# Patient Record
Sex: Female | Born: 1985
Health system: Southern US, Community
[De-identification: ages and names within clinical notes are randomized; demographics above are authoritative.]

## PROBLEM LIST (undated history)

## (undated) DIAGNOSIS — M069 Rheumatoid arthritis, unspecified: Secondary | ICD-10-CM

## (undated) DIAGNOSIS — D649 Anemia, unspecified: Secondary | ICD-10-CM

## (undated) DIAGNOSIS — M1990 Unspecified osteoarthritis, unspecified site: Secondary | ICD-10-CM

## (undated) DIAGNOSIS — J45909 Unspecified asthma, uncomplicated: Secondary | ICD-10-CM

## (undated) HISTORY — PX: FOOT SURGERY: SHX648

## (undated) HISTORY — PX: EYE SURGERY: SHX253

## (undated) HISTORY — PX: TUBAL LIGATION: SHX77

## (undated) HISTORY — DX: Rheumatoid arthritis, unspecified: M06.9

## (undated) HISTORY — DX: Unspecified asthma, uncomplicated: J45.909

## (undated) HISTORY — PX: WISDOM TOOTH EXTRACTION: SHX21

---

## 2012-03-22 HISTORY — PX: WISDOM TOOTH EXTRACTION: SHX21

## 2020-06-11 ENCOUNTER — Ambulatory Visit: Admit: 2020-06-11 | Disposition: A | Discharge status: Home / Self Care | Payer: Self-pay | Source: Home / Self Care

## 2020-06-11 ENCOUNTER — Ambulatory Visit
Admission: EM | Admit: 2020-06-11 | Discharge: 2020-06-11 | Disposition: A | Discharge status: Home / Self Care | Payer: Self-pay

## 2020-06-11 ENCOUNTER — Other Ambulatory Visit: Payer: Self-pay

## 2020-06-11 DIAGNOSIS — J069 Acute upper respiratory infection, unspecified: Secondary | ICD-10-CM

## 2020-06-11 DIAGNOSIS — Z20822 Contact with and (suspected) exposure to covid-19: Secondary | ICD-10-CM

## 2020-06-11 HISTORY — DX: Anemia, unspecified: D64.9

## 2020-06-11 MED ORDER — FLUTICASONE PROPIONATE 50 MCG/ACT NA SUSP: 1.0000 | Freq: Every day | NASAL | 0 refills | Status: DC

## 2020-06-11 MED ORDER — BENZONATATE 200 MG PO CAPS: 200.0000 mg | ORAL_CAPSULE | Freq: Three times a day (TID) | ORAL | 0 refills | Status: AC | PRN

## 2020-06-11 NOTE — ED Triage Notes (Signed)
Pt c/o cough, sore throat, headache, fever, and fatigue since Sunday night. Took OTC with slight relief.

## 2020-06-11 NOTE — Discharge Instructions (Signed)
Covid test pending, monitor MyChart for results Rest and fluids Tylenol and ibuprofen as needed May continue Mucinex, add in [facility] May use daily over-the-counter antihistamine such as cetirizine/Zyrtec or loratadine/Claritin Tessalon for cough or over-the-counter Robitussin, Delsym, Dimetapp Follow-up if not improving or worsening

## 2020-06-11 NOTE — ED Provider Notes (Signed)
[facility]    CSN: [id number] Arrival date & time: 06/11/20  0857      History   Chief Complaint Chief Complaint  Patient presents with  . Cough    HPI Erica Fox is a 35 y.o. female presenting today for evaluation of URI symptoms.  Reports sore throat fever ear discomfort fatigue and generalized weakness.  Symptoms began Sunday night persisted for the past 4 days.  Unsure of fevers at home.  Said decreased appetite and generalized fatigue.  Denies any chest pain or shortness of breath.  Using over-the-counter Mucinex.  HPI  Past Medical History:  Diagnosis Date  . Anemia     There are no problems to display for this patient.   History reviewed. No pertinent surgical history.  OB History   No obstetric history on file.      Home Medications    Prior to Admission medications   Medication Sig Start Date End Date Taking? Authorizing Provider  benzonatate (TESSALON) 200 MG capsule Take 1 capsule (200 mg total) by mouth 3 (three) times daily as needed for up to 7 days for cough. 06/11/20 06/18/20 Yes Ion Gonnella C, PA-C  dextromethorphan-guaiFENesin (MUCINEX DM) 30-600 MG 12hr tablet Take 1 tablet by mouth 2 (two) times daily.   Yes [provider]  fluticasone (FLONASE) 50 MCG/ACT nasal spray Place 1-2 sprays into both nostrils daily. 06/11/20  Yes Rodricus Candelaria, Elesa Hacker, PA-C    Family History History reviewed. No pertinent family history.  Social History Social History   Tobacco Use  . Smoking status: Never Smoker  . Smokeless tobacco: Never Used  Substance Use Topics  . Alcohol use: Not Currently  . Drug use: Not Currently     Allergies   Penicillins   Review of Systems Review of Systems  Constitutional: Negative for activity change, appetite change, chills, fatigue and fever.  HENT: Positive for congestion, rhinorrhea, sinus pressure and sore throat. Negative for ear pain and trouble swallowing.   Eyes: Negative for discharge  and redness.  Respiratory: Negative for cough, chest tightness and shortness of breath.   Cardiovascular: Negative for chest pain.  Gastrointestinal: Negative for abdominal pain, diarrhea, nausea and vomiting.  Musculoskeletal: Negative for myalgias.  Skin: Negative for rash.  Neurological: Negative for dizziness, light-headedness and headaches.     Physical Exam Triage Vital Signs ED Triage Vitals  Enc Vitals Group     BP      Pulse      Resp      Temp      Temp src      SpO2      Weight      Height      Head Circumference      Peak Flow      Pain Score      Pain Loc      Pain Edu?      Excl. in [place]?    No data found.  Updated Vital Signs BP 123/61 (BP Location: Left Arm)   Pulse 97   Temp 99.3 F (37.4 C) (Oral)   Resp 18   SpO2 97%   Visual Acuity Right Eye Distance:   Left Eye Distance:   Bilateral Distance:    Right Eye Near:   Left Eye Near:    Bilateral Near:     Physical Exam Vitals and nursing note reviewed.  Constitutional:      Appearance: She is well-developed.     Comments: No acute distress  HENT:     Head: Normocephalic and atraumatic.     Ears:     Comments: Bilateral ears without tenderness to palpation of external auricle, tragus and mastoid, EAC's without erythema or swelling, TM's with good bony landmarks and cone of light. Non erythematous.     Nose: Nose normal.     Mouth/Throat:     Comments: Oral mucosa pink and moist, no tonsillar enlargement or exudate. Posterior pharynx patent and nonerythematous, no uvula deviation or swelling. Normal phonation. Eyes:     Conjunctiva/sclera: Conjunctivae normal.  Cardiovascular:     Rate and Rhythm: Normal rate.  Pulmonary:     Effort: Pulmonary effort is normal. No respiratory distress.     Comments: Breathing comfortably at rest, CTABL, no wheezing, rales or other adventitious sounds auscultated Abdominal:     General: There is no distension.  Musculoskeletal:        General: Normal  range of motion.     Cervical back: Neck supple.  Skin:    General: Skin is warm and dry.  Neurological:     Mental Status: She is alert and oriented to person, place, and time.      UC Treatments / Results  Labs (all labs ordered are listed, but only abnormal results are displayed) Labs Reviewed  COVID-19, FLU A+B NAA    EKG   Radiology No results found.  Procedures Procedures (including critical care time)  Medications Ordered in UC Medications - No data to display  Initial Impression / Assessment and Plan / UC Course  I have reviewed the triage vital signs and the nursing notes.  Pertinent labs & imaging results that were available during my care of the patient were reviewed by me and considered in my medical decision making (see chart for details).     Viral URI with cough-Covid test pending, symptoms x4 days, exam reassuring, recommend symptomatic and supportive care with close monitoring.  Discussed strict return precautions. Patient verbalized understanding and is agreeable with plan.  Final Clinical Impressions(s) / UC Diagnoses   Final diagnoses:  Encounter for screening laboratory testing for COVID-19 virus  Viral URI with cough     Discharge Instructions     Covid test pending, monitor MyChart for results Rest and fluids Tylenol and ibuprofen as needed May continue Mucinex, add in [facility] May use daily over-the-counter antihistamine such as cetirizine/Zyrtec or loratadine/Claritin Tessalon for cough or over-the-counter Robitussin, Delsym, Dimetapp Follow-up if not improving or worsening    ED Prescriptions    Medication Sig Dispense Auth. Provider   benzonatate (TESSALON) 200 MG capsule Take 1 capsule (200 mg total) by mouth 3 (three) times daily as needed for up to 7 days for cough. 28 capsule Kayliana Codd C, PA-C   fluticasone (FLONASE) 50 MCG/ACT nasal spray Place 1-2 sprays into both nostrils daily. 16 g Jareli Highland, [place] C, PA-C      PDMP not reviewed this encounter.   Janith Lima, [place] 06/11/20 [phone]

## 2020-06-12 LAB — COVID-19, FLU A+B NAA
Influenza A, NAA: NOT DETECTED
Influenza B, NAA: NOT DETECTED
SARS-CoV-2, NAA: NOT DETECTED

## 2020-07-16 ENCOUNTER — Encounter: Payer: Self-pay | Admitting: Podiatry

## 2020-07-16 ENCOUNTER — Ambulatory Visit (INDEPENDENT_AMBULATORY_CARE_PROVIDER_SITE_OTHER): Payer: Commercial Managed Care - PPO

## 2020-07-16 ENCOUNTER — Ambulatory Visit: Payer: Commercial Managed Care - PPO | Admitting: Podiatry

## 2020-07-16 ENCOUNTER — Encounter: Payer: Self-pay | Admitting: *Deleted

## 2020-07-16 ENCOUNTER — Other Ambulatory Visit: Payer: Self-pay

## 2020-07-16 DIAGNOSIS — M722 Plantar fascial fibromatosis: Secondary | ICD-10-CM

## 2020-07-16 DIAGNOSIS — M2042 Other hammer toe(s) (acquired), left foot: Secondary | ICD-10-CM | POA: Diagnosis not present

## 2020-07-16 DIAGNOSIS — M7662 Achilles tendinitis, left leg: Secondary | ICD-10-CM

## 2020-07-16 MED ORDER — MELOXICAM 15 MG PO TABS: 15.0000 mg | ORAL_TABLET | Freq: Every day | ORAL | 3 refills | Status: DC

## 2020-07-16 NOTE — Progress Notes (Signed)
  Subjective:  Patient ID: Erica Fox, female    DOB: 27-Sep-1985,  MRN: [id number]  Chief Complaint  Patient presents with  . Foot Pain    Patient presents today for left lateral side foot and achilles pain x 6 months.  She says its sharp pains that comes and goes and is worse by the end of the day.  She also c/o hammertoes left foot that can become painful and bothers her with certain shoes she wears    35 y.o. female presents with the above complaint. History confirmed with patient.  She works on her feet as a Secondary school teacher  Objective:  Physical Exam: warm, good capillary refill, no trophic changes or ulcerative lesions, normal DP and PT pulses and normal sensory exam. Left Foot: She has pain on palpation to the mid substance and insertion of the hallux valgus, hammertoes 2 through 5, are rigid in nature Right Foot: Hammertoes 2 through 5, rigid in nature  Radiographs: X-ray of the left foot: Small calcification at the insertion of the Achilles tendon posterior calcaneus, and hammertoe deformities 2 through 5 Assessment:   1. Hammertoe of left foot   2. Achilles tendinitis of left lower extremity   3. Plantar fasciitis of left foot      Plan:  Patient was evaluated and treated and all questions answered.  Discussed the etiology and treatment options for Achilles tendinitis including stretching, formal physical therapy with an eccentric exercises therapy plan, supportive shoegears such as a running shoe or sneaker, heel lifts, topical and oral medications.  We also discussed that I do not routinely perform injections in this area because of the risk of an increased damage or rupture of the tendon.  We also discussed the role of surgical treatment of this for patients who do not improve after exhausting non-surgical treatment options.  -XR reviewed with patient -Educated on stretching and icing of the affected limb. -Rx for Meloxicam. Advised on risks, benefits, and alternatives  of the medication  -Recommend WBAT in CAM boot this was dispensed -Return in 6 weeks to follow-up on this -If not improved by next visit may consider adding Topaz procedure to surgical intervention  Discussed the etiology and treatment including surgical and non surgical treatment for painful  hammertoes.  She has exhausted all non surgical treatment prior to this visit including shoe gear changes and padding.  She desires surgical intervention. We discussed all risks including but not limited to: pain, swelling, infection, scar, numbness which may be temporary or permanent, chronic pain, stiffness, nerve pain or damage, wound healing problems, bone healing problems including delayed or non-union and recurrence. Specifically we discussed the following procedures: Left foot hammertoe correction 2 through 5 left foot. Informed consent was signed today. Surgery will be scheduled at a mutually agreeable date. Information regarding this will be forwarded to our surgery scheduler.   Surgical plan:  Procedure: -Left foot hammertoe correction 2 through 5  Location: -[place]  Anesthesia plan: -IV sedation with local anesthesia  Postoperative pain plan: - Tylenol 1000 mg every 6 hours, ibuprofen 600 mg every 6 hours, gabapentin 300 mg every 8 hours x5 days, oxycodone 5 mg 1-2 tabs every 6 hours only as needed  DVT prophylaxis: -None required  WB Restrictions / DME needs: -WBAT in short CAM boot, dispensed today    Return in about 6 weeks (around 08/27/2020) for re-check Achilles tendon, recheck plantar fasciitis.

## 2020-07-16 NOTE — Patient Instructions (Signed)
Achilles Tendinitis  with Rehab Achilles tendinitis is a disorder of the Achilles tendon. The Achilles tendon connects the large calf muscles (Gastrocnemius and Soleus) to the heel bone (calcaneus). This tendon is sometimes called the heel cord. It is important for pushing-off and standing on your toes and is important for walking, running, or jumping. Tendinitis is often caused by overuse and repetitive microtrauma. SYMPTOMS  Pain, tenderness, swelling, warmth, and redness may occur over the Achilles tendon even at rest.  Pain with pushing off, or flexing or extending the ankle.  Pain that is worsened after or during activity. CAUSES   Overuse sometimes seen with rapid increase in exercise programs or in sports requiring running and jumping.  Poor physical conditioning (strength and flexibility or endurance).  Running sports, especially training running down hills.  Inadequate warm-up before practice or play or failure to stretch before participation.  Injury to the tendon. PREVENTION   Warm up and stretch before practice or competition.  Allow time for adequate rest and recovery between practices and competition.  Keep up conditioning.  Keep up ankle and leg flexibility.  Improve or keep muscle strength and endurance.  Improve cardiovascular fitness.  Use proper technique.  Use proper equipment (shoes, skates).  To help prevent recurrence, taping, protective strapping, or an adhesive bandage may be recommended for several weeks after healing is complete. PROGNOSIS   Recovery may take weeks to several months to heal.  Longer recovery is expected if symptoms have been prolonged.  Recovery is usually quicker if the inflammation is due to a direct blow as compared with overuse or sudden strain. RELATED COMPLICATIONS   Healing time will be prolonged if the condition is not correctly treated. The injury must be given plenty of time to heal.  Symptoms can reoccur if  activity is resumed too soon.  Untreated, tendinitis may increase the risk of tendon rupture requiring additional time for recovery and possibly surgery. TREATMENT   The first treatment consists of rest anti-inflammatory medication, and ice to relieve the pain.  Stretching and strengthening exercises after resolution of pain will likely help reduce the risk of recurrence. Referral to a physical therapist or athletic trainer for further evaluation and treatment may be helpful.  A walking boot or cast may be recommended to rest the Achilles tendon. This can help break the cycle of inflammation and microtrauma.  Arch supports (orthotics) may be prescribed or recommended by your caregiver as an adjunct to therapy and rest.  Surgery to remove the inflamed tendon lining or degenerated tendon tissue is rarely necessary and has shown less than predictable results. MEDICATION   Nonsteroidal anti-inflammatory medications, such as aspirin and ibuprofen, may be used for pain and inflammation relief. Do not take within 7 days before surgery. Take these as directed by your caregiver. Contact your caregiver immediately if any bleeding, stomach upset, or signs of allergic reaction occur. Other minor pain relievers, such as acetaminophen, may also be used.  Pain relievers may be prescribed as necessary by your caregiver. Do not take prescription pain medication for longer than 4 to 7 days. Use only as directed and only as much as you need.  Cortisone injections are rarely indicated. Cortisone injections may weaken tendons and predispose to rupture. It is better to give the condition more time to heal than to use them. HEAT AND COLD  Cold is used to relieve pain and reduce inflammation for acute and chronic Achilles tendinitis. Cold should be applied for 10 to 15 minutes   every 2 to 3 hours for inflammation and pain and immediately after any activity that aggravates your symptoms. Use ice packs or an ice  massage.  Heat may be used before performing stretching and strengthening activities prescribed by your caregiver. Use a heat pack or a warm soak. SEEK MEDICAL CARE IF:  Symptoms get worse or do not improve in 2 weeks despite treatment.  New, unexplained symptoms develop. Drugs used in treatment may produce side effects.  EXERCISES:  RANGE OF MOTION (ROM) AND STRETCHING EXERCISES - Achilles Tendinitis  These exercises may help you when beginning to rehabilitate your injury. Your symptoms may resolve with or without further involvement from your physician, physical therapist or athletic trainer. While completing these exercises, remember:   Restoring tissue flexibility helps normal motion to return to the joints. This allows healthier, less painful movement and activity.  An effective stretch should be held for at least 30 seconds.  A stretch should never be painful. You should only feel a gentle lengthening or release in the stretched tissue.  STRETCH  Gastroc, Standing   Place hands on wall.  Extend right / left leg, keeping the front knee somewhat bent.  Slightly point your toes inward on your back foot.  Keeping your right / left heel on the floor and your knee straight, shift your weight toward the wall, not allowing your back to arch.  You should feel a gentle stretch in the right / left calf. Hold this position for 10 seconds. Repeat 3 times. Complete this stretch 2 times per day.  STRETCH  Soleus, Standing   Place hands on wall.  Extend right / left leg, keeping the other knee somewhat bent.  Slightly point your toes inward on your back foot.  Keep your right / left heel on the floor, bend your back knee, and slightly shift your weight over the back leg so that you feel a gentle stretch deep in your back calf.  Hold this position for 10 seconds. Repeat 3 times. Complete this stretch 2 times per day.  STRETCH  Gastrocsoleus, Standing  Note: This exercise can place  a lot of stress on your foot and ankle. Please complete this exercise only if specifically instructed by your caregiver.   Place the ball of your right / left foot on a step, keeping your other foot firmly on the same step.  Hold on to the wall or a rail for balance.  Slowly lift your other foot, allowing your body weight to press your heel down over the edge of the step.  You should feel a stretch in your right / left calf.  Hold this position for 10 seconds.  Repeat this exercise with a slight bend in your knee. Repeat 3 times. Complete this stretch 2 times per day.   STRENGTHENING EXERCISES - Achilles Tendinitis These exercises may help you when beginning to rehabilitate your injury. They may resolve your symptoms with or without further involvement from your physician, physical therapist or athletic trainer. While completing these exercises, remember:   Muscles can gain both the endurance and the strength needed for everyday activities through controlled exercises.  Complete these exercises as instructed by your physician, physical therapist or athletic trainer. Progress the resistance and repetitions only as guided.  You may experience muscle soreness or fatigue, but the pain or discomfort you are trying to eliminate should never worsen during these exercises. If this pain does worsen, stop and make certain you are following the directions exactly. If   the pain is still present after adjustments, discontinue the exercise until you can discuss the trouble with your clinician.  STRENGTH - Plantar-flexors   Sit with your right / left leg extended. Holding onto both ends of a rubber exercise band/tubing, loop it around the ball of your foot. Keep a slight tension in the band.  Slowly push your toes away from you, pointing them downward.  Hold this position for 10 seconds. Return slowly, controlling the tension in the band/tubing. Repeat 3 times. Complete this exercise 2 times per day.     STRENGTH - Plantar-flexors   Stand with your feet shoulder width apart. Steady yourself with a wall or table using as little support as needed.  Keeping your weight evenly spread over the width of your feet, rise up on your toes.*  Hold this position for 10 seconds. Repeat 3 times. Complete this exercise 2 times per day.  *If this is too easy, shift your weight toward your right / left leg until you feel challenged. Ultimately, you may be asked to do this exercise with your right / left foot only.  STRENGTH  Plantar-flexors, Eccentric  Note: This exercise can place a lot of stress on your foot and ankle. Please complete this exercise only if specifically instructed by your caregiver.   Place the balls of your feet on a step. With your hands, use only enough support from a wall or rail to keep your balance.  Keep your knees straight and rise up on your toes.  Slowly shift your weight entirely to your right / left toes and pick up your opposite foot. Gently and with controlled movement, lower your weight through your right / left foot so that your heel drops below the level of the step. You will feel a slight stretch in the back of your calf at the end position.  Use the healthy leg to help rise up onto the balls of both feet, then lower weight only on the right / left leg again. Build up to 15 repetitions. Then progress to 3 consecutive sets of 15 repetitions.*  After completing the above exercise, complete the same exercise with a slight knee bend (about 30 degrees). Again, build up to 15 repetitions. Then progress to 3 consecutive sets of 15 repetitions.* Perform this exercise 2 times per day.  *When you easily complete 3 sets of 15, your physician, physical therapist or athletic trainer may advise you to add resistance by wearing a backpack filled with additional weight.  STRENGTH - Plantar Flexors, Seated   Sit on a chair that allows your feet to rest flat on the ground. If  necessary, sit at the edge of the chair.  Keeping your toes firmly on the ground, lift your right / left heel as far as you can without increasing any discomfort in your ankle. Repeat 3 times. Complete this exercise 2 times a day.    Plantar Fasciitis (Heel Spur Syndrome) with Rehab The plantar fascia is a fibrous, ligament-like, soft-tissue structure that spans the bottom of the foot. Plantar fasciitis is a condition that causes pain in the foot due to inflammation of the tissue. SYMPTOMS   Pain and tenderness on the underneath side of the foot.  Pain that worsens with standing or walking. CAUSES  Plantar fasciitis is caused by irritation and injury to the plantar fascia on the underneath side of the foot. Common mechanisms of injury include:  Direct trauma to bottom of the foot.  Damage to a small   nerve that runs under the foot where the main fascia attaches to the heel bone.  Stress placed on the plantar fascia due to bone spurs. RISK INCREASES WITH:   Activities that place stress on the plantar fascia (running, jumping, pivoting, or cutting).  Poor strength and flexibility.  Improperly fitted shoes.  Tight calf muscles.  Flat feet.  Failure to warm-up properly before activity.  Obesity. PREVENTION  Warm up and stretch properly before activity.  Allow for adequate recovery between workouts.  Maintain physical fitness:  Strength, flexibility, and endurance.  Cardiovascular fitness.  Maintain a health body weight.  Avoid stress on the plantar fascia.  Wear properly fitted shoes, including arch supports for individuals who have flat feet.  PROGNOSIS  If treated properly, then the symptoms of plantar fasciitis usually resolve without surgery. However, occasionally surgery is necessary.  RELATED COMPLICATIONS   Recurrent symptoms that may result in a chronic condition.  Problems of the lower back that are caused by compensating for the injury, such as  limping.  Pain or weakness of the foot during push-off following surgery.  Chronic inflammation, scarring, and partial or complete fascia tear, occurring more often from repeated injections.  TREATMENT  Treatment initially involves the use of ice and medication to help reduce pain and inflammation. The use of strengthening and stretching exercises may help reduce pain with activity, especially stretches of the Achilles tendon. These exercises may be performed at home or with a therapist. Your caregiver may recommend that you use heel cups of arch supports to help reduce stress on the plantar fascia. Occasionally, corticosteroid injections are given to reduce inflammation. If symptoms persist for greater than 6 months despite non-surgical (conservative), then surgery may be recommended.   MEDICATION   If pain medication is necessary, then nonsteroidal anti-inflammatory medications, such as aspirin and ibuprofen, or other minor pain relievers, such as acetaminophen, are often recommended.  Do not take pain medication within 7 days before surgery.  Prescription pain relievers may be given if deemed necessary by your caregiver. Use only as directed and only as much as you need.  Corticosteroid injections may be given by your caregiver. These injections should be reserved for the most serious cases, because they may only be given a certain number of times.  HEAT AND COLD  Cold treatment (icing) relieves pain and reduces inflammation. Cold treatment should be applied for 10 to 15 minutes every 2 to 3 hours for inflammation and pain and immediately after any activity that aggravates your symptoms. Use ice packs or massage the area with a piece of ice (ice massage).  Heat treatment may be used prior to performing the stretching and strengthening activities prescribed by your caregiver, physical therapist, or athletic trainer. Use a heat pack or soak the injury in warm water.  SEEK IMMEDIATE MEDICAL  CARE IF:  Treatment seems to offer no benefit, or the condition worsens.  Any medications produce adverse side effects.  EXERCISES- RANGE OF MOTION (ROM) AND STRETCHING EXERCISES - Plantar Fasciitis (Heel Spur Syndrome) These exercises may help you when beginning to rehabilitate your injury. Your symptoms may resolve with or without further involvement from your physician, physical therapist or athletic trainer. While completing these exercises, remember:   Restoring tissue flexibility helps normal motion to return to the joints. This allows healthier, less painful movement and activity.  An effective stretch should be held for at least 30 seconds.  A stretch should never be painful. You should only feel a gentle lengthening   or release in the stretched tissue.  RANGE OF MOTION - Toe Extension, Flexion  Sit with your right / left leg crossed over your opposite knee.  Grasp your toes and gently pull them back toward the top of your foot. You should feel a stretch on the bottom of your toes and/or foot.  Hold this stretch for 10 seconds.  Now, gently pull your toes toward the bottom of your foot. You should feel a stretch on the top of your toes and or foot.  Hold this stretch for 10 seconds. Repeat  times. Complete this stretch 3 times per day.   RANGE OF MOTION - Ankle Dorsiflexion, Active Assisted  Remove shoes and sit on a chair that is preferably not on a carpeted surface.  Place right / left foot under knee. Extend your opposite leg for support.  Keeping your heel down, slide your right / left foot back toward the chair until you feel a stretch at your ankle or calf. If you do not feel a stretch, slide your bottom forward to the edge of the chair, while still keeping your heel down.  Hold this stretch for 10 seconds. Repeat 3 times. Complete this stretch 2 times per day.   STRETCH  Gastroc, Standing  Place hands on wall.  Extend right / left leg, keeping the front knee  somewhat bent.  Slightly point your toes inward on your back foot.  Keeping your right / left heel on the floor and your knee straight, shift your weight toward the wall, not allowing your back to arch.  You should feel a gentle stretch in the right / left calf. Hold this position for 10 seconds. Repeat 3 times. Complete this stretch 2 times per day.  STRETCH  Soleus, Standing  Place hands on wall.  Extend right / left leg, keeping the other knee somewhat bent.  Slightly point your toes inward on your back foot.  Keep your right / left heel on the floor, bend your back knee, and slightly shift your weight over the back leg so that you feel a gentle stretch deep in your back calf.  Hold this position for 10 seconds. Repeat 3 times. Complete this stretch 2 times per day.  STRETCH  Gastrocsoleus, Standing  Note: This exercise can place a lot of stress on your foot and ankle. Please complete this exercise only if specifically instructed by your caregiver.   Place the ball of your right / left foot on a step, keeping your other foot firmly on the same step.  Hold on to the wall or a rail for balance.  Slowly lift your other foot, allowing your body weight to press your heel down over the edge of the step.  You should feel a stretch in your right / left calf.  Hold this position for 10 seconds.  Repeat this exercise with a slight bend in your right / left knee. Repeat 3 times. Complete this stretch 2 times per day.   STRENGTHENING EXERCISES - Plantar Fasciitis (Heel Spur Syndrome)  These exercises may help you when beginning to rehabilitate your injury. They may resolve your symptoms with or without further involvement from your physician, physical therapist or athletic trainer. While completing these exercises, remember:   Muscles can gain both the endurance and the strength needed for everyday activities through controlled exercises.  Complete these exercises as instructed by  your physician, physical therapist or athletic trainer. Progress the resistance and repetitions only as guided.  STRENGTH -   Towel Curls  Sit in a chair positioned on a non-carpeted surface.  Place your foot on a towel, keeping your heel on the floor.  Pull the towel toward your heel by only curling your toes. Keep your heel on the floor. Repeat 3 times. Complete this exercise 2 times per day.  STRENGTH - Ankle Inversion  Secure one end of a rubber exercise band/tubing to a fixed object (table, pole). Loop the other end around your foot just before your toes.  Place your fists between your knees. This will focus your strengthening at your ankle.  Slowly, pull your big toe up and in, making sure the band/tubing is positioned to resist the entire motion.  Hold this position for 10 seconds.  Have your muscles resist the band/tubing as it slowly pulls your foot back to the starting position. Repeat 3 times. Complete this exercises 2 times per day.  Document Released: 03/08/2005 Document Revised: 05/31/2011 Document Reviewed: 06/20/2008 [employer] Patient Information 2014 [employer].  

## 2020-08-13 ENCOUNTER — Telehealth: Payer: Self-pay | Admitting: Urology

## 2020-08-13 NOTE — Telephone Encounter (Signed)
DOS - 09/05/20   HAMMERTOE REPAIR 2-5 LEFT --- 25525    [facility] EFFECTIVE DATE - 03/22/20   PLAN DEDUCTIBLE -  $1,250.00 W/ $1,044.43 to go OUT OF POCKET - $6,000.00 W/ $8,948.59 to go  COINSURANCE - 20% COPAY - $0.00   SPOKE WITH WIL WITH [facility] AND HE STATED THAT FOR CPT CODE 34758 X'S 4 NO PRIOR AUTH IS REQUIRED.   REF # [id number]

## 2020-09-01 ENCOUNTER — Ambulatory Visit: Payer: Commercial Managed Care - PPO | Admitting: Podiatry

## 2020-09-01 ENCOUNTER — Other Ambulatory Visit: Payer: Self-pay

## 2020-09-01 ENCOUNTER — Encounter: Payer: Self-pay | Admitting: Podiatry

## 2020-09-01 DIAGNOSIS — Q6689 Other  specified congenital deformities of feet: Secondary | ICD-10-CM

## 2020-09-01 DIAGNOSIS — M7662 Achilles tendinitis, left leg: Secondary | ICD-10-CM

## 2020-09-01 DIAGNOSIS — M2042 Other hammer toe(s) (acquired), left foot: Secondary | ICD-10-CM | POA: Diagnosis not present

## 2020-09-01 NOTE — Progress Notes (Signed)
  Subjective:  Patient ID: Erica Fox, female    DOB: 1986/01/02,  MRN: [id number]  Chief Complaint  Patient presents with   Plantar Fasciitis    6 week follow up   [place] / PF    35 y.o. female returns with the above complaint. History confirmed with patient.  Overall she is much better after being in the boot.  Some of the pain is now the top of the foot. Objective:  Physical Exam: warm, good capillary refill, no trophic changes or ulcerative lesions, normal DP and PT pulses and normal sensory exam. Left Foot: She has pain on palpation to the mid substance and insertion of the hallux valgus, hammertoes 2 through 5, are rigid in nature, she has pain and limited range of motion of the subtalar joint with cracking sensation Right Foot: Hammertoes 2 through 5, rigid in nature  Radiographs: X-ray of the left foot: Small calcification at the insertion of the Achilles tendon posterior calcaneus, and hammertoe deformities 2 through 5, she has dorsal spurring of the talar head and neck and the calcaneocuboid joint, unable to clearly visualize CN and TC joints on oblique and lateral Assessment:   1. Hammertoe of left foot   2. Achilles tendinitis of left lower extremity   3. Tarsal coalition of left foot      Plan:  Patient was evaluated and treated and all questions answered.  Overall doing much better from an Achilles standpoint.  I think the remainder of this will go away after rehabbing from her hammertoe surgery.  She is having of improvement I do not think we need to do a Topaz and PRP injection at the same time on surgery.   New pain she had with stiffness of the hindfoot and across the dorsal foot is concerning for tarsal coalition that is now getting exacerbated.  Her last radiographs showed significant spurring at the talar navicular and calcaneocuboid joint.  MRI is ordered for tarsal coalition.    Hammertoe surgery 2 through 5 the left foot is scheduled for this  Friday, no further questions today, informed consent previously signed   Surgical plan:  Procedure: -Left foot hammertoe correction 2 through 5  Location: -[place]  Anesthesia plan: -IV sedation with local anesthesia  Postoperative pain plan: - Tylenol 1000 mg every 6 hours, ibuprofen 600 mg every 6 hours, gabapentin 300 mg every 8 hours x5 days, oxycodone 5 mg 1-2 tabs every 6 hours only as needed  DVT prophylaxis: -None required  WB Restrictions / DME needs: -WBAT in short CAM boot, which she has   Return for after surgery.

## 2020-09-05 ENCOUNTER — Other Ambulatory Visit: Payer: Self-pay | Admitting: Podiatry

## 2020-09-05 DIAGNOSIS — M2042 Other hammer toe(s) (acquired), left foot: Secondary | ICD-10-CM | POA: Diagnosis not present

## 2020-09-05 MED ORDER — ACETAMINOPHEN 500 MG PO TABS: 1000.0000 mg | ORAL_TABLET | Freq: Four times a day (QID) | ORAL | 0 refills | Status: AC | PRN

## 2020-09-05 MED ORDER — IBUPROFEN 600 MG PO TABS: 600.0000 mg | ORAL_TABLET | Freq: Four times a day (QID) | ORAL | 0 refills | Status: AC | PRN

## 2020-09-05 MED ORDER — OXYCODONE HCL 5 MG PO TABS: 5.0000 mg | ORAL_TABLET | ORAL | 0 refills | Status: AC | PRN

## 2020-09-05 NOTE — Progress Notes (Signed)
09/05/20 [place] Hammertoe 2-5 L foot

## 2020-09-10 ENCOUNTER — Encounter: Payer: Self-pay | Admitting: Podiatry

## 2020-09-10 ENCOUNTER — Other Ambulatory Visit: Payer: Self-pay

## 2020-09-10 ENCOUNTER — Ambulatory Visit (INDEPENDENT_AMBULATORY_CARE_PROVIDER_SITE_OTHER): Payer: Commercial Managed Care - PPO

## 2020-09-10 ENCOUNTER — Ambulatory Visit (INDEPENDENT_AMBULATORY_CARE_PROVIDER_SITE_OTHER): Payer: Commercial Managed Care - PPO | Admitting: Podiatry

## 2020-09-10 VITALS — BP 121/77 | HR 91 | Temp 98.5°F

## 2020-09-10 DIAGNOSIS — Z9889 Other specified postprocedural states: Secondary | ICD-10-CM

## 2020-09-10 DIAGNOSIS — M2042 Other hammer toe(s) (acquired), left foot: Secondary | ICD-10-CM

## 2020-09-10 NOTE — Progress Notes (Signed)
  Subjective:  Patient ID: Erica Fox, female    DOB: [birth year],  MRN: [id number]  Chief Complaint  Patient presents with   Routine Post Op    POV #1 DOS 09/05/2020 HAMMERTOE CORRECTION 2-5 LT FOOT    DOS: 09/05/2020 Procedure: Left foot hammertoe correction 2 through 5  35 y.o. female returns for post-op check.  Doing well not having much pain when taking Tylenol Motrin  Review of Systems: Negative except as noted in the HPI. Denies N/V/F/Ch.   Objective:   Vitals:   09/10/20 1325  BP: 121/77  Pulse: 91  Temp: 98.5 F (36.9 C)   There is no height or weight on file to calculate BMI. Constitutional Well developed. Well nourished.  Vascular Foot warm and well perfused. Capillary refill normal to all digits.   Neurologic Normal speech. Oriented to person, place, and time. Epicritic sensation to light touch grossly present bilaterally.  Dermatologic Skin healing well without signs of infection. Skin edges well coapted without signs of infection.  Orthopedic: Tenderness to palpation noted about the surgical site.  K wires intact and in good position   Multiple view plain film radiographs: Good correction of hammertoe deformities with Kirschner wires intact in good position Assessment:   1. Hammertoe of left foot   2. S/P foot surgery, left    Plan:  Patient was evaluated and treated and all questions answered.  S/p foot surgery left -Progressing as expected post-operatively. -XR: As above -WB Status: WBAT in CAM boot -Sutures: We will remove next visit. -Medications: No refills required -Foot redressed.  Return in about 2 weeks (around 09/24/2020) for suture removal.

## 2020-09-24 ENCOUNTER — Encounter: Payer: Self-pay | Admitting: Podiatry

## 2020-09-24 ENCOUNTER — Ambulatory Visit (INDEPENDENT_AMBULATORY_CARE_PROVIDER_SITE_OTHER): Payer: Commercial Managed Care - PPO | Admitting: Podiatry

## 2020-09-24 ENCOUNTER — Other Ambulatory Visit: Payer: Self-pay

## 2020-09-24 DIAGNOSIS — Z9889 Other specified postprocedural states: Secondary | ICD-10-CM

## 2020-09-24 DIAGNOSIS — M2042 Other hammer toe(s) (acquired), left foot: Secondary | ICD-10-CM

## 2020-09-26 ENCOUNTER — Encounter: Payer: Self-pay | Admitting: Podiatry

## 2020-09-27 ENCOUNTER — Encounter: Payer: Self-pay | Admitting: Podiatry

## 2020-09-27 NOTE — Progress Notes (Signed)
  Subjective:  Patient ID: Erica Fox, female    DOB: Dec 31, 1985,  MRN: [id number]  Chief Complaint  Patient presents with   Routine Post Op    POV #2 DOS 09/05/2020 HAMMERTOE CORRECTION 2-5 LT FOOT    DOS: 09/05/2020 Procedure: Left foot hammertoe correction 2 through 5  35 y.o. female returns for post-op check.  Doing well not having much pain when taking Tylenol Motrin  Review of Systems: Negative except as noted in the HPI. Denies N/V/F/Ch.   Objective:   There were no vitals filed for this visit.  There is no height or weight on file to calculate BMI. Constitutional Well developed. Well nourished.  Vascular Foot warm and well perfused. Capillary refill normal to all digits.   Neurologic Normal speech. Oriented to person, place, and time. Epicritic sensation to light touch grossly present bilaterally.  Dermatologic Skin healing well without signs of infection. Skin edges well coapted without signs of infection.  Orthopedic: Tenderness to palpation noted about the surgical site.  K wires intact and in good position   Multiple view plain film radiographs: Good correction of hammertoe deformities with Kirschner wires intact in good position Assessment:   1. Hammertoe of left foot   2. S/P foot surgery, left     Plan:  Patient was evaluated and treated and all questions answered.  S/p foot surgery left -Sutures removed today, healing well -She may begin bathing apply antibiotic ointment pin sites prior to and after bathing -WBAT in CAM boot -Return in 3 weeks for new radiographs and likely pin removal  Return for post op (new x-rays, pin removal).

## 2020-10-15 ENCOUNTER — Encounter: Payer: Self-pay | Admitting: Podiatry

## 2020-10-15 ENCOUNTER — Ambulatory Visit (INDEPENDENT_AMBULATORY_CARE_PROVIDER_SITE_OTHER): Payer: Commercial Managed Care - PPO

## 2020-10-15 ENCOUNTER — Ambulatory Visit (INDEPENDENT_AMBULATORY_CARE_PROVIDER_SITE_OTHER): Payer: Commercial Managed Care - PPO | Admitting: Podiatry

## 2020-10-15 ENCOUNTER — Other Ambulatory Visit: Payer: Self-pay

## 2020-10-15 DIAGNOSIS — M2042 Other hammer toe(s) (acquired), left foot: Secondary | ICD-10-CM

## 2020-10-15 NOTE — Progress Notes (Signed)
  Subjective:  Patient ID: Woodroe Mode, female    DOB: 10-16-1985,  MRN: [id number]  Chief Complaint  Patient presents with   Routine Post Op      (xray)POV #3 DOS 09/05/2020 HAMMERTOE CORRECTION 2-5 LT FOOT, xray and pin removal    DOS: 09/05/2020 Procedure: Left foot hammertoe correction 2 through 5  35 y.o. female returns for post-op check.  Doing well   Review of Systems: Negative except as noted in the HPI. Denies N/V/F/Ch.   Objective:   There were no vitals filed for this visit.  There is no height or weight on file to calculate BMI. Constitutional Well developed. Well nourished.  Vascular Foot warm and well perfused. Capillary refill normal to all digits.   Neurologic Normal speech. Oriented to person, place, and time. Epicritic sensation to light touch grossly present bilaterally.  Dermatologic Skin healing well without signs of infection. Skin edges well coapted without signs of infection.  Orthopedic: Tenderness to palpation noted about the surgical site.  K wires intact and in good position   Multiple view plain film radiographs: Kirschner wires intact the third toe wire as translated distally some but there is maintenance of the position and bridging across the arthrodesis sites Assessment:   1. Hammertoe of left foot     Plan:  Patient was evaluated and treated and all questions answered.  S/p foot surgery left -Pins removed today -WBAT in regular shoes when tolerated -Return in 6 weeks for new radiographs and review of MRI that is ordered for August 3  Return in about 6 weeks (around 11/26/2020) for post op (new x-rays).

## 2020-10-23 ENCOUNTER — Other Ambulatory Visit: Payer: Self-pay

## 2020-10-23 ENCOUNTER — Ambulatory Visit
Admission: RE | Admit: 2020-10-23 | Discharge: 2020-10-23 | Disposition: A | Discharge status: Home / Self Care | Payer: Commercial Managed Care - PPO | Source: Ambulatory Visit | Attending: Podiatry | Admitting: Podiatry

## 2020-10-23 DIAGNOSIS — Q6689 Other  specified congenital deformities of feet: Secondary | ICD-10-CM

## 2020-10-30 ENCOUNTER — Telehealth: Payer: Self-pay | Admitting: Podiatry

## 2020-10-30 NOTE — Telephone Encounter (Signed)
Patient called stating she saw her MRI results on Mychart and wasn't sure if she needs to make an appt. Patients number is [id number].

## 2020-11-05 ENCOUNTER — Other Ambulatory Visit: Payer: Self-pay

## 2020-11-05 ENCOUNTER — Ambulatory Visit (INDEPENDENT_AMBULATORY_CARE_PROVIDER_SITE_OTHER): Payer: Commercial Managed Care - PPO | Admitting: Podiatry

## 2020-11-05 DIAGNOSIS — M19072 Primary osteoarthritis, left ankle and foot: Secondary | ICD-10-CM | POA: Diagnosis not present

## 2020-11-05 DIAGNOSIS — M898X7 Other specified disorders of bone, ankle and foot: Secondary | ICD-10-CM | POA: Diagnosis not present

## 2020-11-05 DIAGNOSIS — Q6689 Other  specified congenital deformities of feet: Secondary | ICD-10-CM | POA: Diagnosis not present

## 2020-11-10 NOTE — Progress Notes (Signed)
Subjective:  Patient ID: Erica Fox, female    DOB: 10/25/1985,  MRN: [id number]  Chief Complaint  Patient presents with   Hammer Toe     follow up mri results   Tendonitis     follow up mri results    35 y.o. female returns with the above complaint. History confirmed with patient.  She completed the MRI Objective:  Physical Exam: warm, good capillary refill, no trophic changes or ulcerative lesions, normal DP and PT pulses and normal sensory exam. Left Foot: Hammertoe incisions are well-healed, she has pain and limited range of motion of the subtalar joint with cracking sensation Right Foot: Hammertoes 2 through 5, rigid in nature  Radiographs: X-ray of the left foot: Small calcification at the insertion of the Achilles tendon posterior calcaneus, she has dorsal spurring of the talar head and neck and the calcaneocuboid joint, unable to clearly visualize CN and TC joints on oblique and lateral  Study Result  Narrative & Impression  CLINICAL DATA:  Lateral foot pain.   EXAM: MRI OF THE LEFT ANKLE WITHOUT CONTRAST   TECHNIQUE: Multiplanar, multisequence MR imaging of the ankle was performed. No intravenous contrast was administered.   COMPARISON:  Radiographs 10/15/2000   FINDINGS: TENDONS   Peroneal: Intact   Posteromedial: Intact   Anterior: Intact   Achilles: Intact.  Mild tendinopathy with small interstitial tears.   Plantar Fascia: Intact.  No findings to suggest plantar fasciitis.   LIGAMENTS   Lateral: Intact   Medial: Intact   CARTILAGE   Ankle Joint: Early degenerative changes. No joint effusion or osteochondral lesion.   Subtalar Joints/Sinus Tarsi: Findings suspicious for tarsal coalition. There appears to be bony coalition between the talus and the anterior process the calcaneus and also between the anterior process of the calcaneus and the navicular bone. Possible subtle coalition involving the middle talocalcaneal facet with age  advanced degenerative changes. CT may be helpful for further evaluation in this patient.   Mild posterior subtalar joint degenerative changes. There is an elongated and slightly irregular os trigonum. The sinus tarsi is unremarkable. No inflammation or fluid. The cervical and interosseous ligaments are intact and the spring ligament is intact.   Bones: No acute bony findings. No stress fracture or AVN. Pes planus noted.   Other: Unremarkable foot and ankle musculature.   IMPRESSION: 1. Findings suspicious for tarsal coalition as described above. CT may be helpful for further evaluation in this patient. 2. Intact medial and lateral ankle ligaments and tendons. 3. Mild Achilles tendinopathy with small interstitial tears. 4. Early tibiotalar and subtalar joint degenerative changes.     Electronically Signed   By: Marijo Sanes M.D.   On: 10/24/2020 19:17   Assessment:   1. Coalition, talocalcaneal   2. Coalition, calcaneus navicular   3. Exostosis of left foot   4. Osteoarthritis of left subtalar joint      Plan:  Patient was evaluated and treated and all questions answered.  I reviewed the MRI findings in detail with the patient.  We discussed nonsurgical treatment with bracing and/or orthotics as well as surgical treatment.  At her age I think surgical treatment would be her best chance for long-term success.  We discussed resection of the coalition's and likely arthrodesis of the subtalar joint as well.  I am ordering a CT scan to evaluate this for surgical planning prior to surgical intervention.  She would like to plan for surgery in early December and she will be scheduled for  this.  We will sign informed consent closer to surgery. s No follow-ups on file.

## 2020-11-13 ENCOUNTER — Telehealth: Payer: Self-pay | Admitting: Podiatry

## 2020-11-13 NOTE — Telephone Encounter (Signed)
Patient called stating she is in a lot of pain. Patient wants to know if you can call a RX in to pharmacy.

## 2020-11-14 NOTE — Telephone Encounter (Signed)
Patient called back I relayed the message and she said she was [place] try that and if it doesn't work she would call us.

## 2020-11-20 ENCOUNTER — Ambulatory Visit
Admission: RE | Admit: 2020-11-20 | Discharge: 2020-11-20 | Disposition: A | Discharge status: Home / Self Care | Payer: Commercial Managed Care - PPO | Source: Ambulatory Visit | Attending: Podiatry | Admitting: Podiatry

## 2020-11-20 DIAGNOSIS — M898X7 Other specified disorders of bone, ankle and foot: Secondary | ICD-10-CM

## 2020-11-20 DIAGNOSIS — Q6689 Other  specified congenital deformities of feet: Secondary | ICD-10-CM

## 2020-11-20 DIAGNOSIS — M19072 Primary osteoarthritis, left ankle and foot: Secondary | ICD-10-CM

## 2020-11-26 ENCOUNTER — Encounter: Payer: Commercial Managed Care - PPO | Admitting: Podiatry

## 2020-12-02 ENCOUNTER — Ambulatory Visit (INDEPENDENT_AMBULATORY_CARE_PROVIDER_SITE_OTHER): Payer: Commercial Managed Care - PPO | Admitting: Podiatry

## 2020-12-02 ENCOUNTER — Other Ambulatory Visit: Payer: Self-pay

## 2020-12-02 DIAGNOSIS — Q6689 Other  specified congenital deformities of feet: Secondary | ICD-10-CM

## 2020-12-02 DIAGNOSIS — M19072 Primary osteoarthritis, left ankle and foot: Secondary | ICD-10-CM

## 2020-12-02 NOTE — Progress Notes (Signed)
   HPI: 35 y.o. female presenting today for 2 separate issues.  First the patient is presenting today for follow-up evaluation hammertoe surgery to the left foot digits 2-5.  DOS: 09/05/2020.  Patient states that her hammertoe surgery is doing very well and she is very satisfied.  She no longer has any pain or tenderness associated to the area.  Also the patient recently had a CT performed which was ordered by Dr. Sherryle Lis to rule out tarsal coalition of the hindfoot.  Patient states that she was diagnosed with juvenile rheumatoid arthritis as a child.  She has dealt with degenerative arthritic changes ever since.  She presents today to discuss the CT results and she states that she is scheduled to have surgery with Dr. Sherryle Lis in December.  Past Medical History:  Diagnosis Date   Anemia      Physical Exam: General: The patient is alert and oriented x3 in no acute distress.  Dermatology: Skin is warm, dry and supple bilateral lower extremities. Negative for open lesions or macerations.  Incisions along the dorsal aspect of the toes have healed nicely.  Vascular: Palpable pedal pulses bilaterally.  No erythema noted.  There is some moderate edema to the left foot and ankle compared to the contralateral limb.  Capillary refill within normal limits.  Neurological: Epicritic and protective threshold grossly intact bilaterally.   Musculoskeletal Exam: Good alignment of the toes 2-5 of the left foot.  Status post hammertoe correction.  Negative for any tenderness to palpation to the digits There is some tenderness palpation range of motion of the subtalar joint and rear foot.  CT scan LT foot 11/20/2020: IMPRESSION: No solid osseous coalition in the hindfoot. Fibrous and/or cartilaginous calcaneonavicular and talocalcaneal coalition is possible, with early development of mild-to-moderate hindfoot and midfoot degenerative arthritis.   Periarticular osteopenia, which can be seen with disuse  or inflammatory arthritis.   Multiple prior hammertoe corrections.  Assessment: 1.  S/P hammertoe repair 2-5 LT.  DOS: 09/05/2020 2.  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 3.  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 Patient evaluated.  CT scan reviewed.  2.  Regarding the hammertoe surgery, the patient may now resume full activity no restrictions.  She has healed nicely and has no pain associated to her activities or the toes 3.  Apparently the patient is scheduled to have surgery in December to address the possible tarsal coalition of the hindfoot.  Recommend that the patient follow-up in the office for surgical consult beginning of November with Dr. Sherryle Lis, surgeon, to review the surgery in detail and for final surgical consult      Edrick Kins, DPM [facility]  Dr. Edrick Kins, DPM    [address], [place] [address]                Office [phone]  Fax [phone]

## 2021-01-21 ENCOUNTER — Ambulatory Visit (INDEPENDENT_AMBULATORY_CARE_PROVIDER_SITE_OTHER): Payer: Commercial Managed Care - PPO | Admitting: Podiatry

## 2021-01-21 ENCOUNTER — Other Ambulatory Visit: Payer: Self-pay

## 2021-01-21 DIAGNOSIS — M19072 Primary osteoarthritis, left ankle and foot: Secondary | ICD-10-CM | POA: Diagnosis not present

## 2021-01-21 DIAGNOSIS — M898X7 Other specified disorders of bone, ankle and foot: Secondary | ICD-10-CM | POA: Diagnosis not present

## 2021-01-21 DIAGNOSIS — Q6689 Other  specified congenital deformities of feet: Secondary | ICD-10-CM | POA: Diagnosis not present

## 2021-01-25 ENCOUNTER — Encounter: Payer: Self-pay | Admitting: Podiatry

## 2021-01-25 NOTE — Progress Notes (Signed)
Subjective:  Patient ID: Erica Fox, female    DOB: 1986/01/05,  MRN: [id number]  Chief Complaint  Patient presents with   coalition, talocalcaneal      SURGERY CONSULT    35 y.o. female returns with the above complaint. History confirmed with patient.  She completed the CT scan since last visit, ankle continues to be very painful and stiff Objective:  Physical Exam: warm, good capillary refill, no trophic changes or ulcerative lesions, normal DP and PT pulses and normal sensory exam. Left Foot: Hammertoe incisions are well-healed, she has pain and limited range of motion of the subtalar joint with cracking sensation Right Foot: Hammertoes 2 through 5, rigid in nature  Radiographs: X-ray of the left foot: Small calcification at the insertion of the Achilles tendon posterior calcaneus, she has dorsal spurring of the talar head and neck and the calcaneocuboid joint, unable to clearly visualize CN and TC joints on oblique and lateral  Study Result  Narrative & Impression  CLINICAL DATA:  Lateral foot pain.   EXAM: MRI OF THE LEFT ANKLE WITHOUT CONTRAST   TECHNIQUE: Multiplanar, multisequence MR imaging of the ankle was performed. No intravenous contrast was administered.   COMPARISON:  Radiographs 10/15/2000   FINDINGS: TENDONS   Peroneal: Intact   Posteromedial: Intact   Anterior: Intact   Achilles: Intact.  Mild tendinopathy with small interstitial tears.   Plantar Fascia: Intact.  No findings to suggest plantar fasciitis.   LIGAMENTS   Lateral: Intact   Medial: Intact   CARTILAGE   Ankle Joint: Early degenerative changes. No joint effusion or osteochondral lesion.   Subtalar Joints/Sinus Tarsi: Findings suspicious for tarsal coalition. There appears to be bony coalition between the talus and the anterior process the calcaneus and also between the anterior process of the calcaneus and the navicular bone. Possible subtle coalition involving the middle  talocalcaneal facet with age advanced degenerative changes. CT may be helpful for further evaluation in this patient.   Mild posterior subtalar joint degenerative changes. There is an elongated and slightly irregular os trigonum. The sinus tarsi is unremarkable. No inflammation or fluid. The cervical and interosseous ligaments are intact and the spring ligament is intact.   Bones: No acute bony findings. No stress fracture or AVN. Pes planus noted.   Other: Unremarkable foot and ankle musculature.   IMPRESSION: 1. Findings suspicious for tarsal coalition as described above. CT may be helpful for further evaluation in this patient. 2. Intact medial and lateral ankle ligaments and tendons. 3. Mild Achilles tendinopathy with small interstitial tears. 4. Early tibiotalar and subtalar joint degenerative changes.     Electronically Signed   By: Marijo Sanes M.D.   On: 10/24/2020 19:17    CT scan 11/20/2020 IMPRESSION: No solid osseous coalition in the hindfoot. Fibrous and/or cartilaginous calcaneonavicular and talocalcaneal coalition is possible, with early development of mild-to-moderate hindfoot and midfoot degenerative arthritis.   Periarticular osteopenia, which can be seen with disuse or inflammatory arthritis.   Multiple prior hammertoe corrections.     Electronically Signed   By: Maurine Simmering M.D.   On: 11/22/2020 11:33 Assessment:   1. Coalition, talocalcaneal   2. DJD (degenerative joint disease), ankle and foot, left   3. Coalition, calcaneus navicular   4. Exostosis of left foot       Plan:  Patient was evaluated and treated and all questions answered.  Reviewed the findings of the CT scan which I discussed with her confirmed the surgical plan and diagnosis.  Discussed the degenerative changes within the foot and that simple resection of the coalition likely will still necessitate arthrodesis at some point in her lifetime and I would recommend this  primarily at this point.  Surgical we discussed subtalar joint fusion resection of the calcaneonavicular bar and a dorsal talar neck spur.  We discussed the risk benefits and potential complications including but not limited to pain, swelling, infection, scar, numbness which may be temporary or permanent, chronic pain, stiffness, nerve pain or damage, wound healing problems, bone healing problems including delayed or non-union.  Surgery will be scheduled for the end of this year.  Inform consent was signed and reviewed.  All questions were answered   Surgical plan:  Procedure: -Subtalar joint fusion resection of coalition and dorsal spur, bone graft from tibia  Location: -[facility]  Anesthesia plan: -General anesthesia with regional block  Postoperative pain plan: - Tylenol 1000 mg every 6 hours gabapentin 300 mg every 8 hours x5 days, oxycodone 5 mg 1-2 tabs every 6 hours only as needed  DVT prophylaxis: -Xarelto 10 mg nightly  WB Restrictions / DME needs: -NWB in cast  No follow-ups on file.

## 2021-02-20 DIAGNOSIS — M150 Primary generalized (osteo)arthritis: Secondary | ICD-10-CM | POA: Diagnosis not present

## 2021-02-20 DIAGNOSIS — Q6689 Other  specified congenital deformities of feet: Secondary | ICD-10-CM | POA: Diagnosis not present

## 2021-02-25 ENCOUNTER — Encounter: Payer: Commercial Managed Care - PPO | Admitting: Podiatry

## 2021-02-27 ENCOUNTER — Telehealth: Payer: Self-pay | Admitting: Urology

## 2021-02-27 NOTE — Telephone Encounter (Signed)
DOS 03/20/21  TARSAL EXOSTECTOMY LEFT --- 49494 SUBTALOR ARTHRODESIS LEFT --- 47395 OSTECTOMY EXC TARSAL COALITION --- 84417   [facility] EFFECTIVE DATE - 03/22/20   PLAN DEDUCTIBLE - $1,250.00 W/ $0.00 REMAINING OUT OF POCKET - $6,000.00 W/ $1,278.71 REMAINING COINSURANCE - 20% COPAY - $0.00   SPOKE WITH ALI WITH [facility] AND SHE STATED THAT FOR CPT CODES 83672, 55001 AND 64290 NO PRIOR AUTH IS REQUIRED.   REF # [id number]

## 2021-03-11 ENCOUNTER — Encounter: Payer: Commercial Managed Care - PPO | Admitting: Podiatry

## 2021-03-20 ENCOUNTER — Emergency Department (HOSPITAL_COMMUNITY): Payer: Commercial Managed Care - PPO

## 2021-03-20 ENCOUNTER — Telehealth: Payer: Self-pay | Admitting: Podiatry

## 2021-03-20 ENCOUNTER — Other Ambulatory Visit: Payer: Self-pay | Admitting: Podiatry

## 2021-03-20 ENCOUNTER — Encounter (HOSPITAL_COMMUNITY): Payer: Self-pay

## 2021-03-20 ENCOUNTER — Other Ambulatory Visit: Payer: Self-pay

## 2021-03-20 ENCOUNTER — Emergency Department (HOSPITAL_COMMUNITY)
Admission: EM | Admit: 2021-03-20 | Discharge: 2021-03-20 | Disposition: A | Discharge status: Home / Self Care | Payer: Commercial Managed Care - PPO | Attending: Emergency Medicine | Admitting: Emergency Medicine

## 2021-03-20 DIAGNOSIS — R509 Fever, unspecified: Secondary | ICD-10-CM | POA: Insufficient documentation

## 2021-03-20 DIAGNOSIS — Z7901 Long term (current) use of anticoagulants: Secondary | ICD-10-CM | POA: Diagnosis not present

## 2021-03-20 DIAGNOSIS — R000 Tachycardia, unspecified: Secondary | ICD-10-CM | POA: Diagnosis not present

## 2021-03-20 DIAGNOSIS — M89772 Major osseous defect, left ankle and foot: Secondary | ICD-10-CM | POA: Diagnosis not present

## 2021-03-20 DIAGNOSIS — Z20822 Contact with and (suspected) exposure to covid-19: Secondary | ICD-10-CM | POA: Insufficient documentation

## 2021-03-20 DIAGNOSIS — M150 Primary generalized (osteo)arthritis: Secondary | ICD-10-CM | POA: Diagnosis not present

## 2021-03-20 DIAGNOSIS — Q6689 Other  specified congenital deformities of feet: Secondary | ICD-10-CM | POA: Diagnosis not present

## 2021-03-20 DIAGNOSIS — M898X7 Other specified disorders of bone, ankle and foot: Secondary | ICD-10-CM | POA: Diagnosis not present

## 2021-03-20 DIAGNOSIS — R531 Weakness: Secondary | ICD-10-CM | POA: Diagnosis not present

## 2021-03-20 HISTORY — DX: Unspecified osteoarthritis, unspecified site: M19.90

## 2021-03-20 LAB — URINALYSIS, ROUTINE W REFLEX MICROSCOPIC
Bilirubin Urine: NEGATIVE
Glucose, UA: NEGATIVE mg/dL
Hgb urine dipstick: NEGATIVE
Ketones, ur: NEGATIVE mg/dL
Leukocytes,Ua: NEGATIVE
Nitrite: NEGATIVE
Protein, ur: NEGATIVE mg/dL
Specific Gravity, Urine: 1.02 (ref 1.005–1.030)
pH: 7 (ref 5.0–8.0)

## 2021-03-20 LAB — CBC WITH DIFFERENTIAL/PLATELET
Abs Immature Granulocytes: 0.05 10*3/uL (ref 0.00–0.07)
Basophils Absolute: 0 10*3/uL (ref 0.0–0.1)
Basophils Relative: 0 %
Eosinophils Absolute: 0.1 10*3/uL (ref 0.0–0.5)
Eosinophils Relative: 1 %
HCT: 39.8 % (ref 36.0–46.0)
Hemoglobin: 12.7 g/dL (ref 12.0–15.0)
Immature Granulocytes: 1 %
Lymphocytes Relative: 11 %
Lymphs Abs: 1 10*3/uL (ref 0.7–4.0)
MCH: 29.1 pg (ref 26.0–34.0)
MCHC: 31.9 g/dL (ref 30.0–36.0)
MCV: 91.1 fL (ref 80.0–100.0)
Monocytes Absolute: 0.6 10*3/uL (ref 0.1–1.0)
Monocytes Relative: 7 %
Neutro Abs: 7.3 10*3/uL (ref 1.7–7.7)
Neutrophils Relative %: 80 %
Platelets: 160 10*3/uL (ref 150–400)
RBC: 4.37 MIL/uL (ref 3.87–5.11)
RDW: 13.2 % (ref 11.5–15.5)
WBC: 9 10*3/uL (ref 4.0–10.5)
nRBC: 0 % (ref 0.0–0.2)

## 2021-03-20 LAB — HEPATIC FUNCTION PANEL
ALT: 22 U/L (ref 0–44)
AST: 22 U/L (ref 15–41)
Albumin: 3.9 g/dL (ref 3.5–5.0)
Alkaline Phosphatase: 56 U/L (ref 38–126)
Bilirubin, Direct: 0.1 mg/dL (ref 0.0–0.2)
Indirect Bilirubin: 0.3 mg/dL (ref 0.3–0.9)
Total Bilirubin: 0.4 mg/dL (ref 0.3–1.2)
Total Protein: 7 g/dL (ref 6.5–8.1)

## 2021-03-20 LAB — BASIC METABOLIC PANEL
Anion gap: 7 (ref 5–15)
BUN: 12 mg/dL (ref 6–20)
CO2: 22 mmol/L (ref 22–32)
Calcium: 8.7 mg/dL — ABNORMAL LOW (ref 8.9–10.3)
Chloride: 107 mmol/L (ref 98–111)
Creatinine, Ser: 0.68 mg/dL (ref 0.44–1.00)
GFR, Estimated: >60 mL/min (ref >60)
Glucose, Bld: 121 mg/dL — ABNORMAL HIGH (ref 70–99)
Potassium: 3.8 mmol/L (ref 3.5–5.1)
Sodium: 136 mmol/L (ref 135–145)

## 2021-03-20 LAB — RESP PANEL BY RT-PCR (FLU A&B, COVID) ARPGX2
Influenza A by PCR: NEGATIVE
Influenza B by PCR: NEGATIVE
SARS Coronavirus 2 by RT PCR: NEGATIVE

## 2021-03-20 LAB — LACTIC ACID, PLASMA
Lactic Acid, Venous: 1.4 mmol/L (ref 0.5–1.9)
Lactic Acid, Venous: 0.9 mmol/L (ref 0.5–1.9)

## 2021-03-20 LAB — HCG, SERUM, QUALITATIVE: Preg, Serum: NEGATIVE

## 2021-03-20 MED ORDER — MELOXICAM 7.5 MG PO TABS: 7.5000 mg | ORAL_TABLET | Freq: Two times a day (BID) | ORAL | 0 refills | Status: AC | PRN

## 2021-03-20 MED ORDER — OXYCODONE HCL 5 MG PO TABS: 5.0000 mg | ORAL_TABLET | ORAL | 0 refills | Status: DC | PRN

## 2021-03-20 MED ORDER — ONDANSETRON HCL 4 MG PO TABS: 4.0000 mg | ORAL_TABLET | Freq: Four times a day (QID) | ORAL | 0 refills | Status: DC

## 2021-03-20 MED ORDER — GABAPENTIN 300 MG PO CAPS: 300.0000 mg | ORAL_CAPSULE | Freq: Three times a day (TID) | ORAL | 0 refills | Status: DC

## 2021-03-20 MED ORDER — DOXYCYCLINE HYCLATE 100 MG PO TABS: 100.0000 mg | ORAL_TABLET | Freq: Two times a day (BID) | ORAL | 0 refills | Status: DC

## 2021-03-20 MED ORDER — RIVAROXABAN 10 MG PO TABS: 10.0000 mg | ORAL_TABLET | Freq: Every day | ORAL | 0 refills | Status: DC

## 2021-03-20 MED ORDER — ACETAMINOPHEN 325 MG PO TABS
650.0000 mg | ORAL_TABLET | Freq: Once | ORAL | Status: AC
  Administered 2021-03-20: 21:00:00 650 mg via ORAL
  Filled 2021-03-20: qty 2

## 2021-03-20 MED ORDER — ALBUTEROL SULFATE HFA 108 (90 BASE) MCG/ACT IN AERS: 2.0000 | INHALATION_SPRAY | RESPIRATORY_TRACT | 3 refills | Status: DC | PRN

## 2021-03-20 MED ORDER — ACETAMINOPHEN 500 MG PO TABS: 1000.0000 mg | ORAL_TABLET | Freq: Four times a day (QID) | ORAL | 0 refills | Status: DC | PRN

## 2021-03-20 MED ORDER — MELOXICAM 15 MG PO TABS: 15.0000 mg | ORAL_TABLET | Freq: Every day | ORAL | 3 refills | Status: DC

## 2021-03-20 MED ORDER — OXYCODONE HCL 5 MG PO TABS: 5.0000 mg | ORAL_TABLET | ORAL | 0 refills | Status: AC | PRN

## 2021-03-20 MED ORDER — ACETAMINOPHEN 500 MG PO TABS: 1000.0000 mg | ORAL_TABLET | Freq: Four times a day (QID) | ORAL | 0 refills | Status: AC | PRN

## 2021-03-20 NOTE — Discharge Instructions (Addendum)
Your testing today does not show any specific abnormalities, you do not have COVID, you do not have any problems with your liver or kidneys and your blood counts were great.  You do not have the flu either.  I would like for you to follow-up with your family doctor as needed.  You may take either your ibuprofen or the Mobic which I have prescribed for you to help with fevers and body aches.  Zofran will help with nausea and vomiting, albuterol will help if you start to get short of breath or have more coughing.  ER for worsening symptoms

## 2021-03-20 NOTE — ED Provider Notes (Signed)
[facility] EMERGENCY DEPARTMENT Provider Note   CSN: [id number] Arrival date & time: 03/20/21  1851     History Chief Complaint  Patient presents with   Fever    Erica Fox is a 35 y.o. female.   Fever  This patient is a 35 year old female, she has a history of some degenerative joint disease, she has had some type of foot problem in the past and actually underwent a surgical procedure earlier in the day where some bone was grafted from her tibia and inserted into her foot.  She was placed in a cast.  When she got home from the procedure she noted that she was having generalized weakness, she felt febrile and chills, a little bit of shortness of breath and when she called her podiatrist they recommended that she come to the hospital for evaluation.  The patient denies having any sick contacts, she was not sick before the procedure, she did not have any increased swelling in her legs before the procedure, she had not been having nausea vomiting or diarrhea either.  Symptoms of been persistent throughout the day, no treatment prior to arrival.  Past Medical History:  Diagnosis Date   Anemia    Degenerative joint disease     There are no problems to display for this patient.   Past Surgical History:  Procedure Laterality Date   FOOT SURGERY Left      OB History   No obstetric history on file.     No family history on file.  Social History   Tobacco Use   Smoking status: Never   Smokeless tobacco: Never  Vaping Use   Vaping Use: Never used  Substance Use Topics   Alcohol use: Not Currently   Drug use: Not Currently    Home Medications Prior to Admission medications   Medication Sig Start Date End Date Taking? Authorizing Provider  albuterol (VENTOLIN HFA) 108 (90 Base) MCG/ACT inhaler Inhale 2 puffs into the lungs every 4 (four) hours as needed for wheezing or shortness of breath. 03/20/21  Yes Noemi Chapel, MD  meloxicam (MOBIC) 7.5 MG tablet Take 1 tablet  (7.5 mg total) by mouth 2 (two) times daily as needed for up to 14 days for pain. 03/20/21 04/03/21 Yes Noemi Chapel, MD  ondansetron (ZOFRAN) 4 MG tablet Take 1 tablet (4 mg total) by mouth every 6 (six) hours. 03/20/21  Yes Noemi Chapel, MD  acetaminophen (TYLENOL) 500 MG tablet Take 2 tablets (1,000 mg total) by mouth every 6 (six) hours as needed for up to 14 days (pain). 03/20/21 04/03/21  McDonald, Stephan Minister, DPM  doxycycline (VIBRA-TABS) 100 MG tablet Take 1 tablet (100 mg total) by mouth 2 (two) times daily. 03/20/21   McDonald, Stephan Minister, DPM  gabapentin (NEURONTIN) 300 MG capsule Take 1 capsule (300 mg total) by mouth 3 (three) times daily for 7 days. 03/20/21 03/27/21  McDonald, Stephan Minister, DPM  oxyCODONE (OXY IR/ROXICODONE) 5 MG immediate release tablet Take 1 tablet (5 mg total) by mouth every 4 (four) hours as needed for up to 7 days for severe pain. 03/20/21 03/27/21  McDonald, Stephan Minister, DPM  rivaroxaban (XARELTO) 10 MG TABS tablet Take 1 tablet (10 mg total) by mouth daily. 03/20/21   Criselda Peaches, DPM    Allergies    Penicillins  Review of Systems   Review of Systems  Constitutional:  Positive for fever.  All other systems reviewed and are negative.  Physical Exam Updated Vital Signs BP  130/86 (BP Location: Right Arm)    Pulse (!) 115    Temp (!) 100.9 F (38.3 C) (Oral)    Resp 18    Ht 1.727 m (5\' 8" )    Wt 111.1 kg    SpO2 96%    BMI 37.25 kg/m   Physical Exam Vitals and nursing note reviewed.  Constitutional:      General: She is not in acute distress.    Appearance: She is well-developed.  HENT:     Head: Normocephalic and atraumatic.     Mouth/Throat:     Pharynx: No oropharyngeal exudate.  Eyes:     General: No scleral icterus.       Right eye: No discharge.        Left eye: No discharge.     Conjunctiva/sclera: Conjunctivae normal.     Pupils: Pupils are equal, round, and reactive to light.  Neck:     Thyroid: No thyromegaly.     Vascular: No JVD.   Cardiovascular:     Rate and Rhythm: Regular rhythm. Tachycardia present.     Heart sounds: Normal heart sounds. No murmur heard.   No friction rub. No gallop.  Pulmonary:     Effort: Pulmonary effort is normal. No respiratory distress.     Breath sounds: Normal breath sounds. No wheezing or rales.  Abdominal:     General: Bowel sounds are normal. There is no distension.     Palpations: Abdomen is soft. There is no mass.     Tenderness: There is no abdominal tenderness.  Musculoskeletal:        General: No tenderness. Normal range of motion.     Cervical back: Normal range of motion and neck supple.     Comments: Left lower extremity is in a cast, toes are evaluated, great capillary refill, they are warm and perfused.  Lymphadenopathy:     Cervical: No cervical adenopathy.  Skin:    General: Skin is warm and dry.     Findings: No erythema or rash.  Neurological:     General: No focal deficit present.     Mental Status: She is alert.     Coordination: Coordination normal.  Psychiatric:        Behavior: Behavior normal.    ED Results / Procedures / Treatments   Labs (all labs ordered are listed, but only abnormal results are displayed) Labs Reviewed  BASIC METABOLIC PANEL - Abnormal; Notable for the following components:      Result Value   Glucose, Bld 121 (*)    Calcium 8.7 (*)    All other components within normal limits  CULTURE, BLOOD (SINGLE)  RESP PANEL BY RT-PCR (FLU A&B, COVID) ARPGX2  CBC WITH DIFFERENTIAL/PLATELET  LACTIC ACID, PLASMA  LACTIC ACID, PLASMA  HCG, SERUM, QUALITATIVE  URINALYSIS, ROUTINE W REFLEX MICROSCOPIC  HEPATIC FUNCTION PANEL    EKG None  Radiology DG Chest Port 1 View  Result Date: 03/20/2021 CLINICAL DATA:  Fever. EXAM: PORTABLE CHEST 1 VIEW COMPARISON:  None. FINDINGS: The heart size and mediastinal contours are within normal limits. Both lungs are clear. The visualized skeletal structures are unremarkable. IMPRESSION: No active  disease. Electronically Signed   By: Ronney Asters M.D.   On: 03/20/2021 21:32    Procedures Procedures   Medications Ordered in ED Medications  acetaminophen (TYLENOL) tablet 650 mg (650 mg Oral Given 03/20/21 2050)    ED Course  I have reviewed the triage vital signs and the nursing  notes.  Pertinent labs & imaging results that were available during my care of the patient were reviewed by me and considered in my medical decision making (see chart for details).    MDM Rules/Calculators/A&P                          This patient presents to the ED for concern of shortness of breath and fever, this involves an extensive number of treatment options, and is a complaint that carries with it a high risk of complications and morbidity.  The differential diagnosis includes viral infection, pneumonia, COVID-19, flu, less likely to be sepsis or pulmonary embolism.  The patient wishes to be tested for flu and COVID before going forward with PE study.  Especially since this seems less likely   Co morbidities that complicate the patient evaluation  Recent surgery   Additional history obtained:  Additional history obtained from electronic medical record External records from outside source obtained and reviewed including the patient is followed very closely by podiatry as an outpatient, she was seen in the emergency department back in March for a viral cough otherwise the patient has been doing very well.   Lab Tests:  I Ordered, and personally interpreted labs.  The pertinent results include: COVID, flu, basic laboratory work   Imaging Studies ordered:  I ordered imaging studies including chest x-ray I independently visualized and interpreted imaging which showed no signs of acute infiltrate I agree with the radiologist interpretation   Cardiac Monitoring:  The patient was maintained on a cardiac monitor.  I personally viewed and interpreted the cardiac monitored which showed an  underlying rhythm of: Mild sinus tachycardia   Medicines ordered and prescription drug management:  I ordered medication including acetaminophen for for fever Reevaluation of the patient after these medicines showed that the patient improved I have reviewed the patients home medicines and have made adjustments as needed   Test Considered:  CT angiogram of the chest, the patient declined this after risk benefits and alternatives were described as she is low risk for pulmonary embolism and has no risk factors.  Her surgery today does not put her at increased risk   Critical Interventions:  Identification attempt for cause of fever, no cause found     Problem List / ED Course:  Patient improved, vital signs normalized, she is not tachycardic on my repeat exam.  Declined CT angiogram of the chest, stable for discharge with likely viral syndrome, flu and COVID-negative, chest x-ray negative   Reevaluation:  After the interventions noted above, I reevaluated the patient and found that they have :improved   Social Determinants of Health:  None   Dispostion:  After consideration of the diagnostic results and the patients response to treatment, I feel that the patent would benefit from discharge.       Final Clinical Impression(s) / ED Diagnoses Final diagnoses:  Fever, unspecified fever cause  Weakness    Rx / DC Orders ED Discharge Orders          Ordered    meloxicam (MOBIC) 7.5 MG tablet  2 times daily PRN        03/20/21 2341    ondansetron (ZOFRAN) 4 MG tablet  Every 6 hours        03/20/21 2341    albuterol (VENTOLIN HFA) 108 (90 Base) MCG/ACT inhaler  Every 4 hours PRN        03/20/21 2341  Noemi Chapel, MD 03/20/21 [phone]

## 2021-03-20 NOTE — Telephone Encounter (Signed)
I spoke with the patient and her husband today at 618pm, she is having a fever of 101, difficulty breathing and her pulse rate in high 90s. I advised to go immediately to the [place] (nearest for them) and be evaluated for DVT/PE. They are on their way. I notified the OR charge nurse in the [place], [facility] 03/20/2021

## 2021-03-20 NOTE — Progress Notes (Signed)
12/30 subtalar fusion, exostectomy talus

## 2021-03-20 NOTE — ED Triage Notes (Signed)
Pt reports having surgery on left foot for degenerative joint disease this morning, pt says when she got home started having chills and fever. Pt reports fever of 101 at home, didn't take tylenol bc "didn't know if I was supposed to"- called Dr and was told to come to ED.

## 2021-03-20 NOTE — Progress Notes (Signed)
12/30 subtalar fusion and ostectomy talus

## 2021-03-25 ENCOUNTER — Encounter: Payer: Self-pay | Admitting: Podiatry

## 2021-03-25 ENCOUNTER — Other Ambulatory Visit: Payer: Self-pay

## 2021-03-25 ENCOUNTER — Ambulatory Visit (INDEPENDENT_AMBULATORY_CARE_PROVIDER_SITE_OTHER): Payer: Commercial Managed Care - PPO | Admitting: Podiatry

## 2021-03-25 ENCOUNTER — Ambulatory Visit (INDEPENDENT_AMBULATORY_CARE_PROVIDER_SITE_OTHER): Payer: Commercial Managed Care - PPO

## 2021-03-25 DIAGNOSIS — Z9889 Other specified postprocedural states: Secondary | ICD-10-CM

## 2021-03-25 DIAGNOSIS — Q6689 Other  specified congenital deformities of feet: Secondary | ICD-10-CM | POA: Diagnosis not present

## 2021-03-25 LAB — CULTURE, BLOOD (SINGLE)
Culture: NO GROWTH
Special Requests: ADEQUATE

## 2021-03-25 NOTE — Progress Notes (Signed)
°  Subjective:  Patient ID: Erica Fox, female    DOB: [birth year],  MRN: [id number]  Chief Complaint  Patient presents with   Routine Post Op    POV #1 DOS 03/20/2021 TARSAL EXOSTECTOMY LT, SUBTARLAR ARTHRODESIS LT      36 y.o. female returns for post-op check.  She is doing well she is having minimal pain now.  She has not had fever and 2 to 3 days.  Denies chest pain or shortness of breath.  Not having any pain in the foot.  Review of Systems: Negative except as noted in the HPI. Denies N/V/F/Ch.   Objective:  There were no vitals filed for this visit. There is no height or weight on file to calculate BMI. Constitutional Well developed. Well nourished.  Vascular Foot warm and well perfused. Capillary refill normal to all digits.  Calf is soft and supple, no posterior calf or knee pain,   Neurologic Normal speech. Oriented to person, place, and time. Epicritic sensation to light touch grossly present bilaterally.  Dermatologic Cast is clean dry and intact without signs of weightbearing well fitting and not loose or tight  Orthopedic: No pain in the foot she has motor function intact of the toes   Multiple view plain film radiographs: Status post subtalar fusion and exostectomy, good position of hardware without complication similar to immediate postoperative films and coastal tracks from bone graft in the tibia without visible complication Assessment:   1. Coalition, talocalcaneal   2. S/P foot surgery, left    Plan:  Patient was evaluated and treated and all questions answered.  S/p foot surgery left -Progressing as expected post-operatively. -XR: noted above no complications noted -WB Status: NWB in below-knee cast with knee scooter -Sutures: We will remove next visit. -Medications: She did not pick up her Xarelto advised her to check with her pharmacy to make sure that they get the prescription from what I am able to tell the digital prescription was completed and advised  her the importance on taking this. -Foot redressed.  Return in about 2 weeks (around 04/08/2021) for post op (no x-rays), suture removal, cast change.

## 2021-04-01 ENCOUNTER — Encounter: Payer: Commercial Managed Care - PPO | Admitting: Podiatry

## 2021-04-08 ENCOUNTER — Ambulatory Visit (INDEPENDENT_AMBULATORY_CARE_PROVIDER_SITE_OTHER): Payer: Commercial Managed Care - PPO | Admitting: Podiatry

## 2021-04-08 ENCOUNTER — Encounter: Payer: Self-pay | Admitting: Podiatry

## 2021-04-08 ENCOUNTER — Other Ambulatory Visit: Payer: Self-pay

## 2021-04-08 ENCOUNTER — Encounter: Payer: Commercial Managed Care - PPO | Admitting: Podiatry

## 2021-04-08 DIAGNOSIS — Q6689 Other  specified congenital deformities of feet: Secondary | ICD-10-CM

## 2021-04-09 ENCOUNTER — Encounter: Payer: Self-pay | Admitting: Podiatry

## 2021-04-09 NOTE — Progress Notes (Signed)
°  Subjective:  Patient ID: Woodroe Mode, female    DOB: 09/03/1985,  MRN: [id number]  Chief Complaint  Patient presents with   Routine Post Op     POV #2 DOS 03/20/2021 TARSAL EXOSTECTOMY LT, SUBTARLAR ARTHRODESIS LT      36 y.o. female returns for post-op check.  Overall doing well, pain and swelling improving, she is taking her xarelto   Review of Systems: Negative except as noted in the HPI. Denies N/V/F/Ch.   Objective:  There were no vitals filed for this visit. There is no height or weight on file to calculate BMI. Constitutional Well developed. Well nourished.  Vascular Foot warm and well perfused. Capillary refill normal to all digits.  Calf is soft and supple, no posterior calf or knee pain,   Neurologic Normal speech. Oriented to person, place, and time. Epicritic sensation to light touch grossly present bilaterally.  Dermatologic Cast is clean dry and intact without signs of weightbearing well fitting and not loose or tight. Incision well healed and non hypertrophic no SOI  Orthopedic: No pain in the foot she has motor function intact of the toes   Multiple view plain film radiographs: Status post subtalar fusion and exostectomy, good position of hardware without complication similar to immediate postoperative films and coastal tracks from bone graft in the tibia without visible complication Assessment:   1. Coalition, talocalcaneal     Plan:  Patient was evaluated and treated and all questions answered.  S/p foot surgery left -Progressing as expected post-operatively. -XR: noted above no complications noted -WB Status: NWB in below-knee cast with knee scooter -Sutures: removed today  -Medications: no refills needed -New well padded BK cast applied   Return in about 5 weeks (around 05/13/2021) for post op (new x-rays).

## 2021-04-16 ENCOUNTER — Encounter: Payer: Self-pay | Admitting: Podiatry

## 2021-04-20 NOTE — Telephone Encounter (Signed)
Should we just make him an appointment to be seen?

## 2021-04-20 NOTE — Telephone Encounter (Signed)
Please advise. Dr. Sherryle Lis is out of the office

## 2021-04-29 ENCOUNTER — Ambulatory Visit (INDEPENDENT_AMBULATORY_CARE_PROVIDER_SITE_OTHER): Payer: 59

## 2021-04-29 ENCOUNTER — Ambulatory Visit (INDEPENDENT_AMBULATORY_CARE_PROVIDER_SITE_OTHER): Payer: 59 | Admitting: Podiatry

## 2021-04-29 ENCOUNTER — Other Ambulatory Visit: Payer: Self-pay

## 2021-04-29 DIAGNOSIS — M898X7 Other specified disorders of bone, ankle and foot: Secondary | ICD-10-CM

## 2021-04-29 DIAGNOSIS — Q6689 Other  specified congenital deformities of feet: Secondary | ICD-10-CM

## 2021-04-29 DIAGNOSIS — Z9889 Other specified postprocedural states: Secondary | ICD-10-CM

## 2021-04-29 NOTE — Patient Instructions (Signed)
On 05/18/21 you can begin walking in the boot. Start slowly and try just a few minutes at a time. Some swelling and redness is normal after activity but should resolve within 20-30 minutes. Ice as needed and elevate as needed. I expect by next visit you will be able to walk in the boot comfortably

## 2021-05-04 ENCOUNTER — Encounter: Payer: Self-pay | Admitting: Podiatry

## 2021-05-04 NOTE — Progress Notes (Signed)
°  Subjective:  Patient ID: Erica Fox, female    DOB: [birth year],  MRN: [id number]  Chief Complaint  Patient presents with   Routine Post Op    (xray)POV #3 DOS 03/20/2021 TARSAL EXOSTECTOMY LT, SUBTARLAR ARTHRODESIS LT      36 y.o. female returns for post-op check.  Overall doing well, pain and swelling improving, she is taking her xarelto   Review of Systems: Negative except as noted in the HPI. Denies N/V/F/Ch.   Objective:  There were no vitals filed for this visit. There is no height or weight on file to calculate BMI. Constitutional Well developed. Well nourished.  Vascular Foot warm and well perfused. Capillary refill normal to all digits.  Calf is soft and supple, no posterior calf or knee pain,   Neurologic Normal speech. Oriented to person, place, and time. Epicritic sensation to light touch grossly present bilaterally.  Dermatologic Incision well healed and non hypertrophic no SOI  Orthopedic: No pain in the foot she has motor function intact of the toes   Multiple view plain film radiographs: Status post subtalar fusion and exostectomy, good position of hardware without complication similar to previous films, good early consolidation visible across the arthrodesis site Assessment:   1. Coalition, talocalcaneal   2. S/P foot surgery, left   3. Exostosis of left foot     Plan:  Patient was evaluated and treated and all questions answered.  S/p foot surgery left -Progressing as expected post-operatively. -XR: Showing good signs of bone healing no complications of hardware -Continue nonweightbearing for 2 more weeks with knee scooter and then transition to partial weightbearing in cam boot as tolerated over a period of 2 weeks  Return in about 6 weeks (around 06/10/2021) for post op (new x-rays).

## 2021-05-13 ENCOUNTER — Encounter: Payer: Commercial Managed Care - PPO | Admitting: Podiatry

## 2021-06-10 ENCOUNTER — Ambulatory Visit (INDEPENDENT_AMBULATORY_CARE_PROVIDER_SITE_OTHER): Payer: Commercial Managed Care - PPO | Admitting: Podiatry

## 2021-06-10 ENCOUNTER — Other Ambulatory Visit: Payer: Self-pay

## 2021-06-10 ENCOUNTER — Ambulatory Visit: Payer: Commercial Managed Care - PPO

## 2021-06-10 DIAGNOSIS — Q6689 Other  specified congenital deformities of feet: Secondary | ICD-10-CM

## 2021-06-10 DIAGNOSIS — Z9889 Other specified postprocedural states: Secondary | ICD-10-CM

## 2021-06-14 ENCOUNTER — Encounter: Payer: Self-pay | Admitting: Podiatry

## 2021-06-14 NOTE — Progress Notes (Signed)
?  Subjective:  ?Patient ID: Erica Fox, female    DOB: 01-03-1986,  MRN: [id number] ? ?Chief Complaint  ?Patient presents with  ? Routine Post Op  ? ? ? ? ?36 y.o. female returns for post-op check.  Doing well she still having some pain ? ?Review of Systems: Negative except as noted in the HPI. Denies N/V/F/Ch. ? ? ?Objective:  ?There were no vitals filed for this visit. ?There is no height or weight on file to calculate BMI. ?Constitutional Well developed. ?Well nourished.  ?Vascular Foot warm and well perfused. ?Capillary refill normal to all digits.  Calf is soft and supple, no posterior calf or knee pain,   ?Neurologic Normal speech. ?Oriented to person, place, and time. ?Epicritic sensation to light touch grossly present bilaterally.  ?Dermatologic Incision well healed and non hypertrophic no SOI  ?Orthopedic: , Some pain around the subtalar joint  ? ?Multiple view plain film radiographs: New films taken today show good consolidation across the arthrodesis site with no complication of hardware ?Assessment:  ? ?1. Coalition, talocalcaneal   ?2. S/P foot surgery, left   ? ? ?Plan:  ?Patient was evaluated and treated and all questions answered. ? ?S/p foot surgery left ?-Overall doing well she is still having some pain.  I think is safe for her to transition out of the cam boot into a Tri-Lock ankle brace, we reviewed the transition process.  If she is having significant pain or swelling with this in 2 to 3 weeks we will consider ordering a CT scan to evaluate the bone healing but to my exam on her radiographs today it appears to be completely fused.  Physical therapy referral was also sent to begin strengthening conditioning of the lower extremity ?Return in about 6 weeks (around 07/22/2021).  ?

## 2021-06-18 NOTE — Therapy (Signed)
?OUTPATIENT PHYSICAL THERAPY LOWER EXTREMITY EVALUATION ? ? ?Patient Name: Erica Fox ?MRN: [id number] ?DOB:1986-01-06, 36 y.o., female ?Today's Date: 06/19/2021 ? ? PT End of Session - 06/19/21 0725   ? ? Visit Number 1   ? Number of Visits 17   ? Date for PT Re-Evaluation 08/21/21   ? Authorization Type UMR/[employer]HC PPO   ? Progress Note Due on Visit 10   ? PT Start Time 0720   ? PT Stop Time 0805   ? PT Time Calculation (min) 45 min   ? Activity Tolerance Patient tolerated treatment well   ? Behavior During Therapy [facility] for tasks assessed/performed   ? ?  ?  ? ?  ? ? ?Past Medical History:  ?Diagnosis Date  ? Anemia   ? Degenerative joint disease   ? ?Past Surgical History:  ?Procedure Laterality Date  ? FOOT SURGERY Left   ? ?There are no problems to display for this patient. ? ? ?PCP: Patient, No Pcp Per (Inactive) ? ?REFERRING PROVIDER: Criselda Peaches, DPM ? ?REFERRING DIAG: Coalition, talocalcaneal;  S/P foot surgery, left ? ?THERAPY DIAG:  ?Pain in left ankle and joints of left foot ? ?Difficulty in walking, not elsewhere classified ? ?Muscle weakness (generalized) ? ?Stiffness of left ankle, not elsewhere classified ? ?ONSET DATE: 03/20/21 Surgery with pain for a year prior to the surgery ? ?SUBJECTIVE:  ? ?SUBJECTIVE STATEMENT: ?The past week is the best week so far since . I'm walking faster, but limited by amount of time on feet, approx 15 mins experiencing pain and swelling. Pt is to gradually transition to an ankle brace from the cam boot. Intially she had difficuly with starting to walk s crutches 2/27, but improving. Pt has not stood without boot as of yet. ? ?PERTINENT HISTORY: ?Arthritis , obesity ? ?PAIN:  ?Are you having pain? Yes: NPRS scale: 0/10 ?Pain location: L  foot ?Pain description: ache, trobbing, swellling ?Aggravating factors: prolonged standing and walking ?Relieving factors: RICE ?Up on feet 7/10 ? ?PRECAUTIONS: I think is safe for her to transition out of the cam boot into a Tri-Lock  ankle brace, we reviewed the transition process.  06/10/21 note of Dr.McDonald ? ?WEIGHT BEARING RESTRICTIONS No ? ?FALLS:  ?Has patient fallen in last 6 months? No ? ?LIVING ENVIRONMENT: ?Lives with: lives with their family ?Lives in: House/apartment ?No issue with accessing or mobility within home ? ?OCCUPATION: Sport and exercise psychologist, back to work now for 2 weeks, modified duty related to time on her feet ? ?PLOF: Independent ? ?PATIENT GOALS To be more active with less pain to get back to outdoor activity,hiking and fishing ? ? ?OBJECTIVE:  ? ?DIAGNOSTIC FINDINGS: 3/222/23-radiographs today it appears to be completely fused. ? ?PATIENT SURVEYS:  ?FOTO 38% ? ?COGNITION: ? Overall cognitive status: Within functional limits for tasks assessed   ?  ?SENSATION: ?WFL ? ?PALPATION: ?TTP L Achilles and lateral foot in area of incision ? ?LE ROM: ? ?Active ROM Right ?06/19/2021 Left ?06/19/2021  ?Hip flexion    ?Hip extension    ?Hip abduction    ?Hip adduction    ?Hip internal rotation    ?Hip external rotation    ?Knee flexion    ?Knee extension    ?Ankle dorsiflexion  5  ?Ankle plantarflexion  15  ?Ankle inversion  8  ?Ankle eversion  10  ? (Blank rows = not tested) ? ?LE MMT: ? ?MMT Right ?06/19/2021 Left ?06/19/2021  ?Hip flexion    ?Hip extension    ?  Hip abduction    ?Hip adduction    ?Hip internal rotation    ?Hip external rotation    ?Knee flexion    ?Knee extension    ?Ankle dorsiflexion  5  ?Ankle plantarflexion  5  ?Ankle inversion  4  ?Ankle eversion  4  ? (Blank rows = not tested) ? ?FUNCTIONAL TESTS:  ?5 times sit to stand: TBA when indicated ?2 minute walk test: TBA when indicated ? ?GAIT: ?Distance walked: 159f within clinic ?Assistive device utilized:  cam boot ?Level of assistance: Complete Independence ?Comments: WNLs in cam boot ? ?TODAY'S TREATMENT: ?- Long Sitting Calf Stretch with Strap   3 reps - 60 hold ?- Supine Ankle Dorsiflexion and Plantarflexion AROM  5 reps ?- Supine Ankle Inversion and  Eversion AROM  5 reps ?- Supine Ankle Eversion AROM 5reps ?- Supine Ankle Circles  - 5 reps ?- Seated Toe Raise  5 reps ?- Seated Heel Raise  5 reps ?- Ankle Inversion Eversion Towel Slide  5 reps ?- Seated Toe Towel Scrunches  5 reps ?- Seated Arch Lifts  5 reps ? ?PATIENT EDUCATION:  ?Education details: Eval findings, POC, HEP, RICE for symptom management ?Person educated: Patient ?Education method: Explanation, Demonstration, Tactile cues, and Verbal cues ?Education comprehension: verbalized understanding, returned demonstration, verbal cues required, and tactile cues required ? ? ?HOME EXERCISE PROGRAM: ?Access Code: [email or web address]?URL: [email or web address] ?Date: 06/19/2021 ?Prepared by: AGar Ponto? ?Exercises ?- Long Sitting Calf Stretch with Strap  - 2 x daily - 7 x weekly - 1 sets - 3 reps - 60 hold ?- Supine Ankle Dorsiflexion and Plantarflexion AROM  - 2 x daily - 7 x weekly - 1 sets - 10 reps ?- Supine Ankle Inversion and Eversion AROM  - 2 x daily - 7 x weekly - 1 sets - 10 reps ?- Supine Ankle Eversion AROM  - 2 x daily - 7 x weekly - 1 sets - 10 reps ?- Supine Ankle Circles  - 2 x daily - 7 x weekly - 1 sets - 10 reps ?- Seated Toe Raise  - 1 x daily - 7 x weekly - 1 sets - 10 reps ?- Seated Heel Raise  - 1 x daily - 7 x weekly - 1 sets - 10 reps ?- Ankle Inversion Eversion Towel Slide  - 1 x daily - 7 x weekly - 1 sets - 10 reps ?- Seated Toe Towel Scrunches  - 1 x daily - 7 x weekly - 1 sets - 10 reps ?- Seated Arch Lifts  - 1 x daily - 7 x weekly - 10 reps ? ?ASSESSMENT: ? ?CLINICAL IMPRESSION: ?Patient is a 36y.o. F who was seen today for physical therapy evaluation and treatment for Coalition, talocalcaneal;  S/P foot surgery, left.  ? ? ?OBJECTIVE IMPAIRMENTS Abnormal gait, decreased activity tolerance, difficulty walking, decreased ROM, decreased strength, increased edema, impaired flexibility, obesity, and pain.  ? ?ACTIVITY LIMITATIONS cleaning, community activity, driving, meal  prep, occupation, laundry, yard work, and shopping.  ? ?PERSONAL FACTORS Time since onset of injury/illness/exacerbation and 1-2 comorbidities: arthritis and obesity  are also affecting patient's functional outcome.  ? ? ?REHAB POTENTIAL: Good ? ?CLINICAL DECISION MAKING: Stable/uncomplicated ? ?EVALUATION COMPLEXITY: Low ? ? ?GOALS: ? ?SHORT TERM GOALS: Target date: 07/10/2021 ? ?Pt will be Ind in an initial HEP  ?Baseline: started on eval ?Goal status: INITIAL ? ?2.  Pt will voice understanding of measures to assist c symptom management ?Baseline:  ?Goal  status: INITIAL ? ?3.  Pt will initiate walking outside of the cam boot in an ankle brace ?Baseline: Unable ?Goal status: INITIAL ? ?LONG TERM GOALS: Target date: 08/21/21 ? ?Increase L ankle Inv and Ev strength to 5/5 for improved L ankle/foot function ?Baseline: 4/5 ?Goal status: INITIAL ? ?2.  Increase L ankle ROM tolerated for improved L ankle/foot function ?Baseline: See flow sheets ?Goal status: INITIAL ? ?3.  Increase pt's FOTO score to predicted value of 63% ?Baseline: 38% ?Goal status: INITIAL ? ?4.  Pt will be Ind in a final HEP to maintain achieved LOF ?Baseline: started in eval ?Goal status: INITIAL ? ?5.  Pt will be able to walk Indly c an ankle brace at an appropriate pace and without a limp  ?Baseline:  ?Goal status: INITIAL ? ?6.  Set goals higher level functional goals as indicated ?Baseline:  ?Goal status: INITIAL ? ? ?PLAN: ?PT FREQUENCY: 2x/week ? ?PT DURATION: 8 weeks ? ?PLANNED INTERVENTIONS: Therapeutic exercises, Therapeutic activity, Balance training, Gait training, Patient/Family education, Joint mobilization, Dry Needling, Electrical stimulation, Cryotherapy, Moist heat, Taping, Ultrasound, Ionotophoresis '4mg'$ /ml Dexamethasone, and Manual therapy ? ?PLAN FOR NEXT SESSION: Assess response to HEP, review FOTO, progress therex as indicated ? ? ?Gar Ponto MS, PT ?06/19/21 8:21 PM ? ?

## 2021-06-19 ENCOUNTER — Ambulatory Visit: Payer: Commercial Managed Care - PPO | Attending: Podiatry

## 2021-06-19 DIAGNOSIS — Q6689 Other  specified congenital deformities of feet: Secondary | ICD-10-CM | POA: Diagnosis not present

## 2021-06-19 DIAGNOSIS — M6281 Muscle weakness (generalized): Secondary | ICD-10-CM | POA: Diagnosis present

## 2021-06-19 DIAGNOSIS — R262 Difficulty in walking, not elsewhere classified: Secondary | ICD-10-CM | POA: Diagnosis present

## 2021-06-19 DIAGNOSIS — Z9889 Other specified postprocedural states: Secondary | ICD-10-CM | POA: Diagnosis not present

## 2021-06-19 DIAGNOSIS — M25672 Stiffness of left ankle, not elsewhere classified: Secondary | ICD-10-CM | POA: Insufficient documentation

## 2021-06-19 DIAGNOSIS — M25572 Pain in left ankle and joints of left foot: Secondary | ICD-10-CM | POA: Insufficient documentation

## 2021-06-25 ENCOUNTER — Ambulatory Visit (HOSPITAL_COMMUNITY): Payer: Commercial Managed Care - PPO | Admitting: Physical Therapy

## 2021-07-01 NOTE — Therapy (Signed)
?OUTPATIENT PHYSICAL THERAPY TREATMENT NOTE ? ? ?Patient Name: Erica Fox ?MRN: [id number] ?DOB:25-Nov-1985, 36 y.o., female ?Today's Date: 07/02/2021 ? ?PCP: Patient, No Pcp Per (Inactive) ?REFERRING PROVIDER: Criselda Peaches, DPM ? ?END OF SESSION:  ? PT End of Session - 07/02/21 0723   ? ? Visit Number 2   ? Number of Visits 17   ? Date for PT Re-Evaluation 08/21/21   ? Authorization Type UMR/[employer]HC PPO   ? Progress Note Due on Visit 10   ? PT Start Time 0720   ? PT Stop Time 0810   ? PT Time Calculation (min) 50 min   ? Activity Tolerance Patient tolerated treatment well   ? Behavior During Therapy [facility] for tasks assessed/performed   ? ?  ?  ? ?  ? ? ?Past Medical History:  ?Diagnosis Date  ? Anemia   ? Degenerative joint disease   ? ?Past Surgical History:  ?Procedure Laterality Date  ? FOOT SURGERY Left   ? ?There are no problems to display for this patient. ? ? ?REFERRING DIAG: Coalition, talocalcaneal;  S/P foot surgery, left ? ?THERAPY DIAG:  ?Pain in left ankle and joints of left foot ? ?Difficulty in walking, not elsewhere classified ? ?Muscle weakness (generalized) ? ?Stiffness of left ankle, not elsewhere classified ? ?SUBJECTIVE:  ?  ?SUBJECTIVE STATEMENT: ?Pt reports her L ankle/foot is gradually getting better. She is wearing the cam boot less. She reports consistency with HEP and managing the discomfort and swelling c RICE.  ?  ?PAIN:  ?Are you having pain? Yes: NPRS scale: 0/10 ?Pain location: L  foot ?Pain description: ache, trobbing, swellling ?Aggravating factors: prolonged standing and walking ?Relieving factors: RICE ?Up on feet 7/10 ? ?PERTINENT HISTORY: ?Arthritis , obesity ?  ?PRECAUTIONS: I think is safe for her to transition out of the cam boot into a Tri-Lock ankle brace, we reviewed the transition process.  06/10/21 note of Dr.McDonald ?  ?WEIGHT BEARING RESTRICTIONS No ? ?  ?PATIENT GOALS To be more active with less pain to get back to outdoor activity,hiking and fishing ?  ?  ?OBJECTIVE:  ?   ?DIAGNOSTIC FINDINGS: 3/222/23-radiographs today it appears to be completely fused. ?  ?PATIENT SURVEYS:  ?FOTO 38% ?  ?COGNITION: ?          Overall cognitive status: Within functional limits for tasks assessed               ?           ?SENSATION: ?WFL ?  ?PALPATION: ?TTP L Achilles and lateral foot in area of incision ?  ?LE ROM: ?  ?Active ROM Right ?06/19/2021 Left ?06/19/2021  ?Hip flexion      ?Hip extension      ?Hip abduction      ?Hip adduction      ?Hip internal rotation      ?Hip external rotation      ?Knee flexion      ?Knee extension      ?Ankle dorsiflexion   5  ?Ankle plantarflexion   15  ?Ankle inversion   8  ?Ankle eversion   10  ? (Blank rows = not tested) ?  ?LE MMT: ?  ?MMT Right ?06/19/2021 Left ?06/19/2021  ?Hip flexion      ?Hip extension      ?Hip abduction      ?Hip adduction      ?Hip internal rotation      ?Hip external rotation      ?  Knee flexion      ?Knee extension      ?Ankle dorsiflexion   5  ?Ankle plantarflexion   5  ?Ankle inversion   4  ?Ankle eversion   4  ? (Blank rows = not tested) ?  ?FUNCTIONAL TESTS:  ?5 times sit to stand: TBA when indicated ?2 minute walk test: TBA when indicated ?  ?GAIT: ?Distance walked: 164f within clinic ?Assistive device utilized:  cam boot ?Level of assistance: Complete Independence ?Comments: WNLs in cam boot ?  ?TODAY'S TREATMENT: ? [facility]                                                [facility] 07[facility]21 ?Therapeutic Exercise: ?Rec bike 5 mins L! ?Standing gastroc stretch 2x20" ?Standing soleus stretch 2x20" ?4 way ankle resistance RTB 2x10 ?Modalities: ?Cold pack 10 mins to the L ankle ?Self Care: ?Gait training for heel to toe pattern. Decreased step length and early heel off.  ? ?Eval Treatment: ?- Long Sitting Calf Stretch with Strap   3 reps - 60 hold ?- Supine Ankle Dorsiflexion and Plantarflexion AROM  5 reps ?- Supine Ankle Inversion and Eversion AROM  5 reps ?- Supine Ankle Eversion AROM 5reps ?- Supine Ankle Circles  - 5  reps ?- Seated Toe Raise  5 reps ?- Seated Heel Raise  5 reps ?- Ankle Inversion Eversion Towel Slide  5 reps ?- Seated Toe Towel Scrunches  5 reps ?- Seated Arch Lifts  5 reps ?  ?PATIENT EDUCATION:  ?Education details: Eval findings, POC, HEP, RICE for symptom management ?Person educated: Patient ?Education method: Explanation, Demonstration, Tactile cues, and Verbal cues ?Education comprehension: verbalized understanding, returned demonstration, verbal cues required, and tactile cues required ?  ?  ?HOME EXERCISE PROGRAM: ?Access Code: [email or web address]?URL: [email or web address] ?Date: 06/19/2021 ?Prepared by: AGar Ponto?  ?Exercises ?- Long Sitting Calf Stretch with Strap  - 2 x daily - 7 x weekly - 1 sets - 3 reps - 60 hold ?- Supine Ankle Dorsiflexion and Plantarflexion AROM  - 2 x daily - 7 x weekly - 1 sets - 10 reps ?- Supine Ankle Inversion and Eversion AROM  - 2 x daily - 7 x weekly - 1 sets - 10 reps ?- Supine Ankle Eversion AROM  - 2 x daily - 7 x weekly - 1 sets - 10 reps ?- Supine Ankle Circles  - 2 x daily - 7 x weekly - 1 sets - 10 reps ?- Seated Toe Raise  - 1 x daily - 7 x weekly - 1 sets - 10 reps ?- Seated Heel Raise  - 1 x daily - 7 x weekly - 1 sets - 10 reps ?- Ankle Inversion Eversion Towel Slide  - 1 x daily - 7 x weekly - 1 sets - 10 reps ?- Seated Toe Towel Scrunches  - 1 x daily - 7 x weekly - 1 sets - 10 reps ?- Seated Arch Lifts  - 1 x daily - 7 x weekly - 10 reps ?  ?ASSESSMENT: ?  ?CLINICAL IMPRESSION: ?PT was completed for gait training for a heel o toe gait pattern. Pt's pattern improved, step length is decreased with early heel up. Pt also participated in L ankle ROM and strengthening therex. There exs were added to the pt's HEP and she completed properly.  Pt tolerated today's PT session without adverse effects. A cold pack was aplied post session to manage anticipated discomfort and swelling. ?  ?  ?OBJECTIVE IMPAIRMENTS Abnormal gait, decreased activity tolerance,  difficulty walking, decreased ROM, decreased strength, increased edema, impaired flexibility, obesity, and pain.  ?  ?REHAB POTENTIAL: Good ? ?  ?  ?GOALS: ?  ?SHORT TERM GOALS: Target date: 07/10/2021 ?  ?Pt will be Ind in an initial HEP  ?Baseline: started on eval ?Goal status: INITIAL ?  ?2.  Pt will voice understanding of measures to assist c symptom management ?Baseline:  ?Goal status: INITIAL ?  ?3.  Pt will initiate walking outside of the cam boot in an ankle brace ?Baseline: Unable ?Goal status: INITIAL ?  ?LONG TERM GOALS: Target date: 08/21/21 ?  ?Increase L ankle Inv and Ev strength to 5/5 for improved L ankle/foot function ?Baseline: 4/5 ?Goal status: INITIAL ?  ?2.  Increase L ankle ROM tolerated for improved L ankle/foot function ?Baseline: See flow sheets ?Goal status: INITIAL ?  ?3.  Increase pt's FOTO score to predicted value of 63% ?Baseline: 38% ?Goal status: INITIAL ?  ?4.  Pt will be Ind in a final HEP to maintain achieved LOF ?Baseline: started in eval ?Goal status: INITIAL ?  ?5.  Pt will be able to walk Indly c an ankle brace at an appropriate pace and without a limp  ?Baseline:  ?Goal status: INITIAL ?  ?6.  Set goals higher level functional goals as indicated ?Baseline:  ?Goal status: INITIAL ?  ?  ?PLAN: ?PT FREQUENCY: 2x/week ?  ?PT DURATION: 8 weeks ?  ?PLANNED INTERVENTIONS: Therapeutic exercises, Therapeutic activity, Balance training, Gait training, Patient/Family education, Joint mobilization, Dry Needling, Electrical stimulation, Cryotherapy, Moist heat, Taping, Ultrasound, Ionotophoresis '4mg'$ /ml Dexamethasone, and Manual therapy ?  ?PLAN FOR NEXT SESSION: Assess response to HEP, review FOTO, progress therex as indicated ?  ?  ? ?Gar Ponto MS, PT ?07/02/21 10:16 AM ? ? ?   ?

## 2021-07-02 ENCOUNTER — Ambulatory Visit: Payer: Commercial Managed Care - PPO | Attending: Podiatry

## 2021-07-02 DIAGNOSIS — R262 Difficulty in walking, not elsewhere classified: Secondary | ICD-10-CM | POA: Diagnosis present

## 2021-07-02 DIAGNOSIS — M25672 Stiffness of left ankle, not elsewhere classified: Secondary | ICD-10-CM

## 2021-07-02 DIAGNOSIS — M25572 Pain in left ankle and joints of left foot: Secondary | ICD-10-CM | POA: Diagnosis present

## 2021-07-02 DIAGNOSIS — M6281 Muscle weakness (generalized): Secondary | ICD-10-CM | POA: Diagnosis present

## 2021-07-03 ENCOUNTER — Ambulatory Visit: Payer: Commercial Managed Care - PPO

## 2021-07-03 DIAGNOSIS — M6281 Muscle weakness (generalized): Secondary | ICD-10-CM

## 2021-07-03 DIAGNOSIS — M25572 Pain in left ankle and joints of left foot: Secondary | ICD-10-CM

## 2021-07-03 DIAGNOSIS — M25672 Stiffness of left ankle, not elsewhere classified: Secondary | ICD-10-CM

## 2021-07-03 DIAGNOSIS — R262 Difficulty in walking, not elsewhere classified: Secondary | ICD-10-CM

## 2021-07-03 NOTE — Therapy (Signed)
?OUTPATIENT PHYSICAL THERAPY TREATMENT NOTE ? ? ?Patient Name: Erica Fox ?MRN: [id number] ?DOB:21-Feb-1986, 36 y.o., female ?Today's Date: 07/03/2021 ? ?PCP: Patient, No Pcp Per (Inactive) ?REFERRING PROVIDER: Criselda Peaches, DPM ? ?END OF SESSION:  ? PT End of Session - 07/03/21 7169   ? ? Visit Number 3   ? Number of Visits 17   ? Date for PT Re-Evaluation 08/21/21   ? Authorization Type UMR/[employer]HC PPO   ? Progress Note Due on Visit 10   ? PT Start Time 0720   ? PT Stop Time 0802   ? PT Time Calculation (min) 42 min   ? Activity Tolerance Patient tolerated treatment well   ? Behavior During Therapy [facility] for tasks assessed/performed   ? ?  ?  ? ?  ? ? ? ?Past Medical History:  ?Diagnosis Date  ? Anemia   ? Degenerative joint disease   ? ?Past Surgical History:  ?Procedure Laterality Date  ? FOOT SURGERY Left   ? ?There are no problems to display for this patient. ? ? ?REFERRING DIAG: Coalition, talocalcaneal;  S/P foot surgery, left ? ?THERAPY DIAG:  ?Pain in left ankle and joints of left foot ? ?Difficulty in walking, not elsewhere classified ? ?Muscle weakness (generalized) ? ?Stiffness of left ankle, not elsewhere classified ? ?SUBJECTIVE:  ?  ?SUBJECTIVE STATEMENT: ?Pt reports her L ankle wasa little aggravated after yesterdays PT session after in the afternoon, but not bad. ?  ?PAIN:  ?Are you having pain? Yes: NPRS scale: 1/10 ?Pain location: L  foot ?Pain description: ache, trobbing, swellling ?Aggravating factors: prolonged standing and walking ?Relieving factors: RICE ?Up on feet 7/10 ? ?PERTINENT HISTORY: ?Arthritis , obesity ?  ?PRECAUTIONS: I think is safe for her to transition out of the cam boot into a Tri-Lock ankle brace, we reviewed the transition process.  06/10/21 note of Dr.McDonald ?  ?WEIGHT BEARING RESTRICTIONS No ? ?  ?PATIENT GOALS To be more active with less pain to get back to outdoor activity,hiking and fishing ?  ?  ?OBJECTIVE:  ?  ?DIAGNOSTIC FINDINGS: 3/222/23-radiographs today it appears  to be completely fused. ?  ?PATIENT SURVEYS:  ?FOTO 38% ?  ?COGNITION: ?          Overall cognitive status: Within functional limits for tasks assessed               ?           ?SENSATION: ?WFL ?  ?PALPATION: ?TTP L Achilles and lateral foot in area of incision ?  ?LE ROM: ?  ?Active ROM Right ?06/19/2021 Left ?06/19/2021  ?Hip flexion      ?Hip extension      ?Hip abduction      ?Hip adduction      ?Hip internal rotation      ?Hip external rotation      ?Knee flexion      ?Knee extension      ?Ankle dorsiflexion   5  ?Ankle plantarflexion   15  ?Ankle inversion   8  ?Ankle eversion   10  ? (Blank rows = not tested) ?  ?LE MMT: ?  ?MMT Right ?06/19/2021 Left ?06/19/2021  ?Hip flexion      ?Hip extension      ?Hip abduction      ?Hip adduction      ?Hip internal rotation      ?Hip external rotation      ?Knee flexion      ?  Knee extension      ?Ankle dorsiflexion   5  ?Ankle plantarflexion   5  ?Ankle inversion   4  ?Ankle eversion   4  ? (Blank rows = not tested) ?  ?FUNCTIONAL TESTS:  ?5 times sit to stand: TBA when indicated ?2 minute walk test: TBA when indicated ?  ?GAIT: ?Distance walked: 189f within clinic ?Assistive device utilized:  cam boot ?Level of assistance: Complete Independence ?Comments: WNLs in cam boot ?  ?[facility]                                                [facility] ?Therapeutic Exercise: ?Rec bike 5 mins L1 ?Standing gastroc stretch 2x20" ?Standing soleus stretch 2x20" ?Standing Hip abd GTB 3x10 ?Updated HEP ?Therapeutic Activity: ?2 min walk 2944f?5xSTS 19.6 ?SLS c hand assist as need, longest 2 sec ?Self Care: ?CFM for scar desensitization ?Application of ankle brace ? ? ?[facility] Adult PT Treatment:                                                DATE: 07/02/21 ?Therapeutic Exercise: ?Rec bike 5 mins L! ?Standing gastroc stretch 2x20" ?Standing soleus stretch 2x20" ?4 way ankle resistance RTB 2x10 ?Modalities: ?Cold pack 10 mins to the L ankle ?Self  Care: ?Gait training for heel to toe pattern. Decreased step length and early heel off.  ? ?Eval Treatment: ?- Long Sitting Calf Stretch with Strap   3 reps - 60 hold ?- Supine Ankle Dorsiflexion and Plantarflexion AROM  5 reps ?- Supine Ankle Inversion and Eversion AROM  5 reps ?- Supine Ankle Eversion AROM 5reps ?- Supine Ankle Circles  - 5 reps ?- Seated Toe Raise  5 reps ?- Seated Heel Raise  5 reps ?- Ankle Inversion Eversion Towel Slide  5 reps ?- Seated Toe Towel Scrunches  5 reps ?- Seated Arch Lifts  5 reps ?  ?PATIENT EDUCATION:  ?Education details: Eval findings, POC, HEP, RICE for symptom management ?Person educated: Patient ?Education method: Explanation, Demonstration, Tactile cues, and Verbal cues ?Education comprehension: verbalized understanding, returned demonstration, verbal cues required, and tactile cues required ?  ?  ?HOME EXERCISE PROGRAM: ?Access Code: [email or web address]: [email or web address] ?Date: 06/19/2021 ?Prepared by: AlGar Ponto  ?Exercises ?- Long Sitting Calf Stretch with Strap  - 2 x daily - 7 x weekly - 1 sets - 3 reps - 60 hold ?- Supine Ankle Dorsiflexion and Plantarflexion AROM  - 2 x daily - 7 x weekly - 1 sets - 10 reps ?- Supine Ankle Inversion and Eversion AROM  - 2 x daily - 7 x weekly - 1 sets - 10 reps ?- Supine Ankle Eversion AROM  - 2 x daily - 7 x weekly - 1 sets - 10 reps ?- Supine Ankle Circles  - 2 x daily - 7 x weekly - 1 sets - 10 reps ?- Seated Toe Raise  - 1 x daily - 7 x weekly - 1 sets - 10 reps ?- Seated Heel Raise  - 1 x daily - 7 x weekly - 1 sets - 10 reps ?- Ankle Inversion Eversion Towel Slide  - 1 x daily - 7 x  weekly - 1 sets - 10 reps ?- Seated Toe Towel Scrunches  - 1 x daily - 7 x weekly - 1 sets - 10 reps ?- Seated Arch Lifts  - 1 x daily - 7 x weekly - 10 reps ?  ?ASSESSMENT: ?  ?CLINICAL IMPRESSION: ?PT was completed for functional activities, balance, hip strengthening, scar management, and application of the ankle brace. Pt's HEP  was updated. Pt tolerated the session without adverse effects. Pt will benefit from skilled PT to address impairments for improved function and QOL. ?  ? ?OBJECTIVE IMPAIRMENTS Abnormal gait, decreased activity tolerance, difficulty walking, decreased ROM, decreased strength, increased edema, impaired flexibility, obesity, and pain.  ?  ?REHAB POTENTIAL: Good ? ?  ?  ?GOALS: ?  ?SHORT TERM GOALS: Target date: 07/10/2021 ?  ?Pt will be Ind in an initial HEP  ?Baseline: started on eval ?Goal status: INITIAL ?  ?2.  Pt will voice understanding of measures to assist c symptom management ?Baseline:  ?Goal status: INITIAL ?  ?3.  Pt will initiate walking outside of the cam boot in an ankle brace ?Baseline: Unable ?Goal status: INITIAL ?  ?LONG TERM GOALS: Target date: 08/21/21 ?  ?Increase L ankle Inv and Ev strength to 5/5 for improved L ankle/foot function ?Baseline: 4/5 ?Goal status: INITIAL ?  ?2.  Increase L ankle ROM tolerated for improved L ankle/foot function ?Baseline: See flow sheets ?Goal status: INITIAL ?  ?3.  Increase pt's FOTO score to predicted value of 63% ?Baseline: 38% ?Goal status: INITIAL ?  ?4.  Pt will be Ind in a final HEP to maintain achieved LOF ?Baseline: started in eval ?Goal status: INITIAL ?  ?5.  Pt will be able to walk Indly c an ankle brace at an appropriate pace and without a limp  ?Baseline:  ?Goal status: INITIAL ?  ?6.  Set goals higher level functional goals as indicated. 2MWT 487f, 5xSTS 15sec or less, SLS 5 sec ?Baseline:2920f 19.6, 2 sec respectively  ?Goal status: INITIAL ?  ?  ?PLAN: ?PT FREQUENCY: 2x/week ?  ?PT DURATION: 8 weeks ?  ?PLANNED INTERVENTIONS: Therapeutic exercises, Therapeutic activity, Balance training, Gait training, Patient/Family education, Joint mobilization, Dry Needling, Electrical stimulation, Cryotherapy, Moist heat, Taping, Ultrasound, Ionotophoresis '4mg'$ /ml Dexamethasone, and Manual therapy ?  ?PLAN FOR NEXT SESSION: Assess response to HEP, review FOTO,  progress therex as indicated ?  ?  ? ?AlGar PontoS, PT ?07/03/21 10:09 AM ? ? ?   ?

## 2021-07-08 NOTE — Therapy (Signed)
?OUTPATIENT PHYSICAL THERAPY TREATMENT NOTE ? ? ?Patient Name: Erica Fox ?MRN: [id number] ?DOB:1986-01-22, 36 y.o., female ?Today's Date: 07/09/2021 ? ?PCP: Patient, No Pcp Per (Inactive) ?REFERRING PROVIDER: Criselda Peaches, DPM ? ?END OF SESSION:  ? PT End of Session - 07/09/21 9417   ? ? Visit Number 4   ? Number of Visits 17   ? Date for PT Re-Evaluation 08/21/21   ? Authorization Type [employer] PPO   ? Progress Note Due on Visit 10   ? PT Start Time [phone]   ? PT Stop Time 0800   ? PT Time Calculation (min) 42 min   ? Activity Tolerance Patient tolerated treatment well   ? Behavior During Therapy [facility] for tasks assessed/performed   ? ?  ?  ? ?  ? ? ? ? ?Past Medical History:  ?Diagnosis Date  ? Anemia   ? Degenerative joint disease   ? ?Past Surgical History:  ?Procedure Laterality Date  ? FOOT SURGERY Left   ? ?There are no problems to display for this patient. ? ? ?REFERRING DIAG: Coalition, talocalcaneal;  S/P foot surgery, left ? ?THERAPY DIAG:  ?Pain in left ankle and joints of left foot ? ?Difficulty in walking, not elsewhere classified ? ?Muscle weakness (generalized) ? ?Stiffness of left ankle, not elsewhere classified ? ?SUBJECTIVE:  ?  ?SUBJECTIVE STATEMENT: ?Pt reports she is spending about 1/2 the day out of the boot. ?  ?PAIN:  ?Are you having pain? Yes: NPRS scale: 0/10 ?Pain location: L  foot ?Pain description: ache, trobbing, swellling ?Aggravating factors: prolonged standing and walking ?Relieving factors: RICE ?Up on feet 7/10 ? ?PERTINENT HISTORY: ?Arthritis , obesity ?  ?PRECAUTIONS: I think is safe for her to transition out of the cam boot into a Tri-Lock ankle brace, we reviewed the transition process.  06/10/21 note of Dr.McDonald ?  ?WEIGHT BEARING RESTRICTIONS No ? ?  ?PATIENT GOALS To be more active with less pain to get back to outdoor activity,hiking and fishing ?  ?  ?OBJECTIVE:  ?  ?DIAGNOSTIC FINDINGS: 3/222/23-radiographs today it appears to be completely fused. ?  ?PATIENT SURVEYS:   ?FOTO 38% ?  ?COGNITION: ?          Overall cognitive status: Within functional limits for tasks assessed               ?           ?SENSATION: ?WFL ?  ?PALPATION: ?TTP L Achilles and lateral foot in area of incision ?  ?LE ROM: ?  ?Active ROM Right ?06/19/2021 Left ?06/19/2021  ?Hip flexion      ?Hip extension      ?Hip abduction      ?Hip adduction      ?Hip internal rotation      ?Hip external rotation      ?Knee flexion      ?Knee extension      ?Ankle dorsiflexion   5  ?Ankle plantarflexion   15  ?Ankle inversion   8  ?Ankle eversion   10  ? (Blank rows = not tested) ?  ?LE MMT: ?  ?MMT Right ?06/19/2021 Left ?06/19/2021  ?Hip flexion      ?Hip extension      ?Hip abduction      ?Hip adduction      ?Hip internal rotation      ?Hip external rotation      ?Knee flexion      ?Knee extension      ?  Ankle dorsiflexion   5  ?Ankle plantarflexion   5  ?Ankle inversion   4  ?Ankle eversion   4  ? (Blank rows = not tested) ?  ?FUNCTIONAL TESTS:  ?5 times sit to stand: TBA when indicated ?2 minute walk test: TBA when indicated ?  ?GAIT: ?Distance walked: 134f within clinic ?Assistive device utilized:  cam boot ?Level of assistance: Complete Independence ?Comments: WNLs in cam boot ?  ?[facility]RE[facility]                                                [facility] ?Therapeutic Exercise: ?Rec bike 5 mins L1 ?Standing gastroc stretch c 2x30" ?Standing soleus stretch 2x30" ?Ankle DF/PF and Inv/Ev 2x20 each ?SLS L  LE 3x30" 1 hand A ?Marching on airex, small range  2x30" ?Standing L Hip abd and ext GTB 3x10 ?Arch lifts 2x10 ?Towel scrunches 2x10 ?Towel inv/Ev 2x10 ?Modalities: ?Cold pack x10 mins to the L ankle/foot ? ? [facility]                                                [facility] ?Therapeutic Exercise: ?Rec bike 5 mins L1 ?Standing gastroc stretch 2x20" ?Standing soleus stretch 2x20" ?Standing Hip abd GTB 3x10 ?Updated HEP ?Therapeutic Activity: ?2 min walk 2966f?5xSTS  19.6 ?SLS c hand assist as need, longest 2 sec ?Self Care: ?CFM for scar desensitization ?Application of ankle brace ? ? ?[facility] Adult PT Treatment:                                                DATE: 07/02/21 ?Therapeutic Exercise: ?Rec bike 5 mins L! ?Standing gastroc stretch 2x20" ?Standing soleus stretch 2x20" ?4 way ankle resistance RTB 2x10 ?Modalities: ?Cold pack 10 mins to the L ankle ?Self Care: ?Gait training for heel to toe pattern. Decreased step length and early heel off.  ?  ?PATIENT EDUCATION:  ?Education details: Eval findings, POC, HEP, RICE for symptom management ?Person educated: Patient ?Education method: Explanation, Demonstration, Tactile cues, and Verbal cues ?Education comprehension: verbalized understanding, returned demonstration, verbal cues required, and tactile cues required ?  ?  ?HOME EXERCISE PROGRAM: ?Access Code: [email or web address]: [email or web address] ?Date: 06/19/2021 ?Prepared by: AlGar Ponto  ?Exercises ?- Long Sitting Calf Stretch with Strap  - 2 x daily - 7 x weekly - 1 sets - 3 reps - 60 hold ?- Supine Ankle Dorsiflexion and Plantarflexion AROM  - 2 x daily - 7 x weekly - 1 sets - 10 reps ?- Supine Ankle Inversion and Eversion AROM  - 2 x daily - 7 x weekly - 1 sets - 10 reps ?- Supine Ankle Eversion AROM  - 2 x daily - 7 x weekly - 1 sets - 10 reps ?- Supine Ankle Circles  - 2 x daily - 7 x weekly - 1 sets - 10 reps ?- Seated Toe Raise  - 1 x daily - 7 x weekly - 1 sets - 10 reps ?- Seated Heel Raise  - 1 x daily - 7  x weekly - 1 sets - 10 reps ?- Ankle Inversion Eversion Towel Slide  - 1 x daily - 7 x weekly - 1 sets - 10 reps ?- Seated Toe Towel Scrunches  - 1 x daily - 7 x weekly - 1 sets - 10 reps ?- Seated Arch Lifts  - 1 x daily - 7 x weekly - 10 reps ?  ?ASSESSMENT: ?  ?CLINICAL IMPRESSION: ?Pt is progressing with more time out of the cam boot. PT was completed for L ankle ROM, blance, and strengthening in the CKC for the L ankle and hip. Pt also completed  intrinsic foot strengthening exs. A cold pack was apllied at the end of PT for symptom management. Pt tolerated the session without adverse effects. ? ? ?OBJECTIVE IMPAIRMENTS Abnormal gait, decreased activity tolerance, difficulty walking, decreased ROM, decreased strength, increased edema, impaired flexibility, obesity, and pain.  ?  ?REHAB POTENTIAL: Good ? ?  ?  ?GOALS: ?  ?SHORT TERM GOALS: Target date: 07/10/2021 ?  ?Pt will be Ind in an initial HEP  ?Baseline: started on eval ?Goal status: INITIAL ?  ?2.  Pt will voice understanding of measures to assist c symptom management ?Baseline:  ?Goal status: INITIAL ?  ?3.  Pt will initiate walking outside of the cam boot in an ankle brace ?Baseline: Unable ?Goal status: INITIAL ?  ?LONG TERM GOALS: Target date: 08/21/21 ?  ?Increase L ankle Inv and Ev strength to 5/5 for improved L ankle/foot function ?Baseline: 4/5 ?Goal status: INITIAL ?  ?2.  Increase L ankle ROM tolerated for improved L ankle/foot function ?Baseline: See flow sheets ?Goal status: INITIAL ?  ?3.  Increase pt's FOTO score to predicted value of 63% ?Baseline: 38% ?Goal status: INITIAL ?  ?4.  Pt will be Ind in a final HEP to maintain achieved LOF ?Baseline: started in eval ?Goal status: INITIAL ?  ?5.  Pt will be able to walk Indly c an ankle brace at an appropriate pace and without a limp  ?Baseline:  ?Goal status: INITIAL ?  ?6.  Set goals higher level functional goals as indicated. 2MWT 430f, 5xSTS 15sec or less, SLS 5 sec ?Baseline:2946f 19.6, 2 sec respectively  ?Goal status: INITIAL ?  ?  ?PLAN: ?PT FREQUENCY: 2x/week ?  ?PT DURATION: 8 weeks ?  ?PLANNED INTERVENTIONS: Therapeutic exercises, Therapeutic activity, Balance training, Gait training, Patient/Family education, Joint mobilization, Dry Needling, Electrical stimulation, Cryotherapy, Moist heat, Taping, Ultrasound, Ionotophoresis '4mg'$ /ml Dexamethasone, and Manual therapy ?  ?PLAN FOR NEXT SESSION: Assess response to HEP, review FOTO,  progress therex as indicated ?  ?  ? ?AlGar PontoS, PT ?07/09/21 8:04 AM ? ? ?   ?

## 2021-07-09 ENCOUNTER — Ambulatory Visit: Payer: Commercial Managed Care - PPO

## 2021-07-09 DIAGNOSIS — M25572 Pain in left ankle and joints of left foot: Secondary | ICD-10-CM

## 2021-07-09 DIAGNOSIS — M25672 Stiffness of left ankle, not elsewhere classified: Secondary | ICD-10-CM

## 2021-07-09 DIAGNOSIS — M6281 Muscle weakness (generalized): Secondary | ICD-10-CM

## 2021-07-09 DIAGNOSIS — R262 Difficulty in walking, not elsewhere classified: Secondary | ICD-10-CM

## 2021-07-09 NOTE — Therapy (Signed)
?OUTPATIENT PHYSICAL THERAPY TREATMENT NOTE ? ? ?Patient Name: Erica Fox ?MRN: [id number] ?DOB:1986-01-20, 36 y.o., female ?Today's Date: 07/10/2021 ? ?PCP: Patient, No Pcp Per (Inactive) ?REFERRING PROVIDER: Criselda Peaches, DPM ? ?END OF SESSION:  ? PT End of Session - 07/10/21 0725   ? ? Visit Number 5   ? Number of Visits 17   ? Date for PT Re-Evaluation 08/21/21   ? Authorization Type [employer] PPO   ? Progress Note Due on Visit 10   ? PT Start Time [phone]   ? PT Stop Time 0800   ? PT Time Calculation (min) 43 min   ? Activity Tolerance Patient tolerated treatment well   ? Behavior During Therapy [facility] for tasks assessed/performed   ? ?  ?  ? ?  ? ? ? ? ? ?Past Medical History:  ?Diagnosis Date  ? Anemia   ? Degenerative joint disease   ? ?Past Surgical History:  ?Procedure Laterality Date  ? FOOT SURGERY Left   ? ?There are no problems to display for this patient. ? ? ?REFERRING DIAG: Coalition, talocalcaneal;  S/P foot surgery, left ? ?THERAPY DIAG:  ?Pain in left ankle and joints of left foot ? ?Difficulty in walking, not elsewhere classified ? ?Muscle weakness (generalized) ? ?Stiffness of left ankle, not elsewhere classified ? ?SUBJECTIVE:  ?  ?SUBJECTIVE STATEMENT: ?Pt reports she went the whole day without wearing the cam boot yesterday. She notes being careful not to walk too fast. ?  ?PAIN:  ?Are you having pain? Yes: NPRS scale: 0/10 ?Pain location: L  foot ?Pain description: ache, trobbing, swellling ?Aggravating factors: prolonged standing and walking ?Relieving factors: RICE ?Up on feet 7/10 ? ?PERTINENT HISTORY: ?Arthritis , obesity ?  ?PRECAUTIONS: I think is safe for her to transition out of the cam boot into a Tri-Lock ankle brace, we reviewed the transition process.  06/10/21 note of Dr.McDonald ?  ?WEIGHT BEARING RESTRICTIONS No ? ?  ?PATIENT GOALS To be more active with less pain to get back to outdoor activity,hiking and fishing ?  ?  ?OBJECTIVE:  ?  ?DIAGNOSTIC FINDINGS: 3/222/23-radiographs  today it appears to be completely fused. ?  ?PATIENT SURVEYS:  ?FOTO 38% ?  ?COGNITION: ?          Overall cognitive status: Within functional limits for tasks assessed               ?           ?SENSATION: ?WFL ?  ?PALPATION: ?TTP L Achilles and lateral foot in area of incision ?  ?LE ROM: ?  ?Active ROM Right ?06/19/2021 Left ?06/19/2021 Lt ?07/10/21  ?Hip flexion       ?Hip extension       ?Hip abduction       ?Hip adduction       ?Hip internal rotation       ?Hip external rotation       ?Knee flexion       ?Knee extension       ?Ankle dorsiflexion   5 8  ?Ankle plantarflexion   15   ?Ankle inversion   8 18  ?Ankle eversion   10 20  ? (Blank rows = not tested) ?  ?LE MMT: ?  ?MMT Right ?06/19/2021 Left ?06/19/2021  ?Hip flexion      ?Hip extension      ?Hip abduction      ?Hip adduction      ?Hip internal rotation      ?  Hip external rotation      ?Knee flexion      ?Knee extension      ?Ankle dorsiflexion   5  ?Ankle plantarflexion   5  ?Ankle inversion   4  ?Ankle eversion   4  ? (Blank rows = not tested) ?  ?FUNCTIONAL TESTS:  ?5 times sit to stand: TBA when indicated ?2 minute walk test: TBA when indicated ?  ?GAIT: ?Distance walked: 123f within clinic ?Assistive device utilized:  cam boot ?Level of assistance: Complete Independence ?Comments: WNLs in cam boot ?  ?[facility]REAT[facility]: [facility]                                                [facility] ?Therapeutic Exercise: ?Rec bike 5 mins L1 ?Standing gastroc stretch c 2x30" ?Standing soleus stretch 2x30" ?Towel inv/Ev 2x10 ?Lateral steps 135fx 4, decreased step length ?Ankle DF/PF 2x20 each at FMK[facility] CenterLateral step ups with airex on 2" platform 2x15 c hand A at FM ?SLS L  LE 3x30" c hand A at FM ?Bridging 2x10 ?SL L hip clams GTB 2x15 ? ?[facility]                                                [facility]: 07/[facility]21 ?Therapeutic Exercise: ?Rec bike 5 mins L1 ?Standing gastroc stretch c 2x30" ?Standing soleus stretch 2x30" ?Ankle DF/PF and  Inv/Ev 2x20 each ?SLS L  LE 3x30" 1 hand A ?Marching on airex, small range  2x30" ?Standing L Hip abd and ext GTB 3x10 ?Arch lifts 2x10 ?Towel scrunches 2x10 ?Towel inv/Ev 2x10 ?Modalities: ?Cold pack x10 mins to the L ankle/foot ? ? ?[facility]                                                DATE: 07/[facility]/21 ?Therapeutic Exercise: ?Rec bike 5 mins L1 ?Standing gastroc stretch 2x20" ?Standing soleus stretch 2x20" ?Standing Hip abd GTB 3x10 ?Updated HEP ?Therapeutic Activity: ?2 min walk 29045f5xSTS 19.6 ?SLS c hand assist as need, longest 2 sec ?Self Care: ?CFM for scar desensitization ?Application of ankle brace ?  ?PATIENT EDUCATION:  ?Education details: Eval findings, POC, HEP, RICE for symptom management ?Person educated: Patient ?Education method: Explanation, Demonstration, Tactile cues, and Verbal cues ?Education comprehension: verbalized understanding, returned demonstration, verbal cues required, and tactile cues required ?  ?  ?HOME EXERCISE PROGRAM: ?Access Code: [email or web address]: [email or web address] ?Date: 06/19/2021 ?Prepared by: AllGar Ponto ?Exercises ?- Long Sitting Calf Stretch with Strap  - 2 x daily - 7 x weekly - 1 sets - 3 reps - 60 hold ?- Supine Ankle Dorsiflexion and Plantarflexion AROM  - 2 x daily - 7 x weekly - 1 sets - 10 reps ?- Supine Ankle Inversion and Eversion AROM  - 2 x daily - 7 x weekly - 1 sets - 10 reps ?- Supine Ankle Eversion AROM  - 2 x daily - 7 x weekly - 1 sets - 10 reps ?- Supine Ankle Circles  - 2 x daily -  7 x weekly - 1 sets - 10 reps ?- Seated Toe Raise  - 1 x daily - 7 x weekly - 1 sets - 10 reps ?- Seated Heel Raise  - 1 x daily - 7 x weekly - 1 sets - 10 reps ?- Ankle Inversion Eversion Towel Slide  - 1 x daily - 7 x weekly - 1 sets - 10 reps ?- Seated Toe Towel Scrunches  - 1 x daily - 7 x weekly - 1 sets - 10 reps ?- Seated Arch Lifts  - 1 x daily - 7 x weekly - 10 reps ?  ?ASSESSMENT: ?  ?CLINICAL IMPRESSION: ?PT was completed for L  ankle and hip strengthening in the open and CKC, L ankle ROM, and balance. L ankle ROMs have increased and pt is walking with an improved gait patter. At end of stance phase c walking, pt has early heel off, but much improved vs. Eval. Pt is making appropriate progress with PT and will benefit from continued services to address impairments to optimize functional mobility.  ? ? ?OBJECTIVE IMPAIRMENTS Abnormal gait, decreased activity tolerance, difficulty walking, decreased ROM, decreased strength, increased edema, impaired flexibility, obesity, and pain.  ?  ?REHAB POTENTIAL: Good ? ?  ?  ?GOALS: ?  ?SHORT TERM GOALS: Target date: 07/10/2021 ?  ?Pt will be Ind in an initial HEP  ?Baseline: started on eval ?Goal status: INITIAL ?  ?2.  Pt will voice understanding of measures to assist c symptom management ?Baseline:  ?Goal status: INITIAL ?  ?3.  Pt will initiate walking outside of the cam boot in an ankle brace ?Baseline: Unable ?Goal status: INITIAL ?  ?LONG TERM GOALS: Target date: 08/21/21 ?  ?Increase L ankle Inv and Ev strength to 5/5 for improved L ankle/foot function ?Baseline: 4/5 ?Goal status: INITIAL ?  ?2.  Increase L ankle ROM tolerated for improved L ankle/foot function ?Baseline: See flow sheets ?Goal status: INITIAL ?  ?3.  Increase pt's FOTO score to predicted value of 63% ?Baseline: 38% ?Goal status: INITIAL ?  ?4.  Pt will be Ind in a final HEP to maintain achieved LOF ?Baseline: started in eval ?Goal status: INITIAL ?  ?5.  Pt will be able to walk Indly c an ankle brace at an appropriate pace and without a limp  ?Baseline:  ?Goal status: INITIAL ?  ?6.  Set goals higher level functional goals as indicated. 2MWT 490f, 5xSTS 15sec or less, SLS 5 sec ?Baseline:293f 19.6, 2 sec respectively  ?Goal status: INITIAL ?  ?  ?PLAN: ?PT FREQUENCY: 2x/week ?  ?PT DURATION: 8 weeks ?  ?PLANNED INTERVENTIONS: Therapeutic exercises, Therapeutic activity, Balance training, Gait training, Patient/Family education,  Joint mobilization, Dry Needling, Electrical stimulation, Cryotherapy, Moist heat, Taping, Ultrasound, Ionotophoresis '4mg'$ /ml Dexamethasone, and Manual therapy ?  ?PLAN FOR NEXT SESSION: Assess response to HEP

## 2021-07-10 ENCOUNTER — Ambulatory Visit: Payer: Commercial Managed Care - PPO

## 2021-07-10 DIAGNOSIS — R262 Difficulty in walking, not elsewhere classified: Secondary | ICD-10-CM

## 2021-07-10 DIAGNOSIS — M25572 Pain in left ankle and joints of left foot: Secondary | ICD-10-CM | POA: Diagnosis not present

## 2021-07-10 DIAGNOSIS — M25672 Stiffness of left ankle, not elsewhere classified: Secondary | ICD-10-CM

## 2021-07-10 DIAGNOSIS — M6281 Muscle weakness (generalized): Secondary | ICD-10-CM

## 2021-07-16 NOTE — Therapy (Signed)
?OUTPATIENT PHYSICAL THERAPY TREATMENT NOTE ? ? ?Patient Name: Erica Fox ?MRN: [id number] ?DOB:10/04/1985, 36 y.o., female ?Today's Date: 07/17/2021 ? ?PCP: Patient, No Pcp Per (Inactive) ?REFERRING PROVIDER: Criselda Peaches, DPM ? ?END OF SESSION:  ? PT End of Session - 07/17/21 0729   ? ? Visit Number 6   ? Number of Visits 17   ? Date for PT Re-Evaluation 08/21/21   ? Authorization Type [employer] PPO   ? Progress Note Due on Visit 10   ? PT Start Time [phone]   ? PT Stop Time 0800   ? PT Time Calculation (min) 43 min   ? Activity Tolerance Patient tolerated treatment well   ? Behavior During Therapy [facility] for tasks assessed/performed   ? ?  ?  ? ?  ? ? ? ? ? ? ?Past Medical History:  ?Diagnosis Date  ? Anemia   ? Degenerative joint disease   ? ?Past Surgical History:  ?Procedure Laterality Date  ? FOOT SURGERY Left   ? ?There are no problems to display for this patient. ? ? ?REFERRING DIAG: Coalition, talocalcaneal;  S/P foot surgery, left ? ?THERAPY DIAG:  ?Pain in left ankle and joints of left foot ? ?Difficulty in walking, not elsewhere classified ? ?Muscle weakness (generalized) ? ?Stiffness of left ankle, not elsewhere classified ? ?SUBJECTIVE:  ?  ?SUBJECTIVE STATEMENT: ?Pt reports she is not using the cam boot. She will use ice and elevation at the end of a day to manage swelling and discomfort, which she notes is working well. ?  ?PAIN:  ?Are you having pain? Yes: NPRS scale: 0/10 ?Pain location: L  foot ?Pain description: ache, trobbing, swellling ?Aggravating factors: prolonged standing and walking ?Relieving factors: RICE ?Up on feet 7/10 ? ?PERTINENT HISTORY: ?Arthritis , obesity ?  ?PRECAUTIONS: I think is safe for her to transition out of the cam boot into a Tri-Lock ankle brace, we reviewed the transition process.  06/10/21 note of Dr.McDonald ?  ?WEIGHT BEARING RESTRICTIONS No ? ?  ?PATIENT GOALS To be more active with less pain to get back to outdoor activity,hiking and fishing ?  ?  ?OBJECTIVE:  ?   ?DIAGNOSTIC FINDINGS: 3/222/23-radiographs today it appears to be completely fused. ?  ?PATIENT SURVEYS:  ?FOTO 38% 07/17/21 59% ?  ?COGNITION: ?          Overall cognitive status: Within functional limits for tasks assessed               ?           ?SENSATION: ?WFL ?  ?PALPATION: ?TTP L Achilles and lateral foot in area of incision ?  ?LE ROM: ?  ?Active ROM Right ?06/19/2021 Left ?06/19/2021 Lt ?07/10/21  ?Hip flexion       ?Hip extension       ?Hip abduction       ?Hip adduction       ?Hip internal rotation       ?Hip external rotation       ?Knee flexion       ?Knee extension       ?Ankle dorsiflexion   5 8  ?Ankle plantarflexion   15   ?Ankle inversion   8 18  ?Ankle eversion   10 20  ? (Blank rows = not tested) ?  ?LE MMT: ?  ?MMT Right ?06/19/2021 Left ?06/19/2021  ?Hip flexion      ?Hip extension      ?Hip abduction      ?  Hip adduction      ?Hip internal rotation      ?Hip external rotation      ?Knee flexion      ?Knee extension      ?Ankle dorsiflexion   5  ?Ankle plantarflexion   5  ?Ankle inversion   4  ?Ankle eversion   4  ? (Blank rows = not tested) ?  ?FUNCTIONAL TESTS:  ?5 times sit to stand: TBA when indicated ?2 minute walk test: TBA when indicated ?  ?GAIT: ?Distance walked: 124f within clinic ?Assistive device utilized:  cam boot ?Level of assistance: Complete Independence ?Comments: WNLs in cam boot ?  ?[facility] TREAT[facility]: [facility]                                                [facility] ?Therapeutic Exercise: ?Nustep 5 mins L5 ?[facility] ?Slant board x1 60" ?L SL stance for balance 5" max time ?Side steps c GTB 156fx6 ?L lat steps 2x10 on 2' box and 2" airex ?Forward step offs/backward step ups 2x15 ? Cybex hip abd 2x10 25# ?Hinged hip lifting 15# x10, 25# x10 ? ?Self Care: ?FOTO 59% ? ? [facility]                                                [facility] ?Therapeutic Exercise: ?Rec bike 5 mins L1 ?Standing gastroc  stretch c 2x30" ?Standing soleus stretch 2x30" ?Towel inv/Ev 2x10 ?Lateral steps 1541f 4, decreased step length ?Ankle DF/PF 2x20 each at FM [facility] with airex on 2" platform 2x15 c hand A at FM ?SLS L  LE 3x30" c hand A at FM ?Bridging 2x10 ?SL L hip clams GTB 2x15 ? ?[facility]                                                [facility] ?Therapeutic Exercise: ?Rec bike 5 mins L1 ?Standing gastroc stretch c 2x30" ?Standing soleus stretch 2x30" ?Ankle DF/PF and Inv/Ev 2x20 each ?SLS L  LE 3x30" 1 hand A ?Marching on airex, small range  2x30" ?Standing L Hip abd and ext GTB 3x10 ?Arch lifts 2x10 ?Towel scrunches 2x10 ?Towel inv/Ev 2x10 ?Modalities: ?Cold pack x10 mins to the L ankle/foot ?  ?PATIENT EDUCATION:  ?Education details: Eval findings, POC, HEP, RICE for symptom management ?Person educated: Patient ?Education method: Explanation, Demonstration, Tactile cues, and Verbal cues ?Education comprehension: verbalized understanding, returned demonstration, verbal cues required, and tactile cues required ?  ?  ?HOME EXERCISE PROGRAM: ?Access Code: [email or web address]: [email or web address] ?Date: 06/19/2021 ?Prepared by: AllGar Ponto ?Exercises ?- Long Sitting Calf Stretch with Strap  - 2 x daily - 7 x weekly - 1 sets - 3 reps - 60 hold ?- Supine Ankle Dorsiflexion and Plantarflexion AROM  - 2 x daily - 7 x weekly - 1 sets - 10 reps ?- Supine Ankle Inversion and Eversion AROM  - 2 x daily - 7 x weekly - 1  sets - 10 reps ?- Supine Ankle Eversion AROM  - 2 x daily - 7 x weekly - 1 sets - 10 reps ?- Supine Ankle Circles  - 2 x daily - 7 x weekly - 1 sets - 10 reps ?- Seated Toe Raise  - 1 x daily - 7 x weekly - 1 sets - 10 reps ?- Seated Heel Raise  - 1 x daily - 7 x weekly - 1 sets - 10 reps ?- Ankle Inversion Eversion Towel Slide  - 1 x daily - 7 x weekly - 1 sets - 10 reps ?- Seated Toe Towel Scrunches  - 1 x daily - 7 x weekly - 1 sets - 10 reps ?- Seated Arch Lifts  - 1 x daily  - 7 x weekly - 10 reps ?  ?ASSESSMENT: ?  ?CLINICAL IMPRESSION: ?Pt participated in PT to improved L ankle strength, ROM, balance and function. PT was primarily completed in the [facility]. Gait quality continues to improve with early heel not as prominent. Pt has progressed to not using the cam boot and her FOTO score has made very good progress, just short of it's LTG. Pt will continue to benefit from skilled PT to improved impairments for optimize funtion of the L LE. ? ?OBJECTIVE IMPAIRMENTS Abnormal gait, decreased activity tolerance, difficulty walking, decreased ROM, decreased strength, increased edema, impaired flexibility, obesity, and pain.  ?  ?REHAB POTENTIAL: Good  ?  ?GOALS: ?  ?SHORT TERM GOALS: Target date: 07/10/2021 ?  ?Pt will be Ind in an initial HEP  ?Baseline: started on eval ?Goal status: MET ?  ?2.  Pt will voice understanding of measures to assist c symptom management ?Baseline:  ?Goal status: MET ?  ?3.  Pt will initiate walking outside of the cam boot in an ankle brace ?Baseline: Unable ?Goal status: MET ?  ?LONG TERM GOALS: Target date: 08/21/21 ?  ?Increase L ankle Inv and Ev strength to 5/5 for improved L ankle/foot function ?Baseline: 4/5 ?Goal status: INITIAL ?  ?2.  Increase L ankle ROM tolerated for improved L ankle/foot function ?Baseline: See flow sheets ?Goal status: INITIAL ?  ?3.  Increase pt's FOTO score to predicted value of 63%.07/17/21= 59% ?Baseline: 38% ?Goal status: Improved ?  ?4.  Pt will be Ind in a final HEP to maintain achieved LOF ?Baseline: started in eval ?Goal status: INITIAL ?  ?5.  Pt will be able to walk Indly c an ankle brace at an appropriate pace and without a limp  ?Baseline:  ?Goal status: INITIAL ?  ?6.  Set goals higher level functional goals as indicated. 2MWT 474f, 5xSTS 15sec or less, SLS 5 sec ?Baseline:2936f 19.6, 2 sec respectively  ?Goal status: INITIAL ?  ?  ?PLAN: ?PT FREQUENCY: 2x/week ?  ?PT DURATION: 8 weeks ?  ?PLANNED INTERVENTIONS: Therapeutic  exercises, Therapeutic activity, Balance training, Gait training, Patient/Family education, Joint mobilization, Dry Needling, Electrical stimulation, Cryotherapy, Moist heat, Taping, Ultrasound, Ionotophoresi

## 2021-07-17 ENCOUNTER — Ambulatory Visit: Payer: Commercial Managed Care - PPO

## 2021-07-17 DIAGNOSIS — M6281 Muscle weakness (generalized): Secondary | ICD-10-CM

## 2021-07-17 DIAGNOSIS — M25672 Stiffness of left ankle, not elsewhere classified: Secondary | ICD-10-CM

## 2021-07-17 DIAGNOSIS — R262 Difficulty in walking, not elsewhere classified: Secondary | ICD-10-CM

## 2021-07-17 DIAGNOSIS — M25572 Pain in left ankle and joints of left foot: Secondary | ICD-10-CM | POA: Diagnosis not present

## 2021-07-21 ENCOUNTER — Ambulatory Visit (INDEPENDENT_AMBULATORY_CARE_PROVIDER_SITE_OTHER): Payer: Commercial Managed Care - PPO

## 2021-07-21 ENCOUNTER — Ambulatory Visit (INDEPENDENT_AMBULATORY_CARE_PROVIDER_SITE_OTHER): Payer: Commercial Managed Care - PPO | Admitting: Podiatry

## 2021-07-21 DIAGNOSIS — Q6689 Other  specified congenital deformities of feet: Secondary | ICD-10-CM

## 2021-07-22 ENCOUNTER — Encounter: Payer: Self-pay | Admitting: Podiatry

## 2021-07-22 NOTE — Progress Notes (Signed)
?  Subjective:  ?Patient ID: Erica Fox, female    DOB: 08/15/1985,  MRN: [id number] ? ?Chief Complaint  ?Patient presents with  ? coalition  ?  6 week follow up  ? ? ? ? ?36 y.o. female returns for post-op check.  Doing well she has some discomfort and stiffness but overall doing much better ? ?Review of Systems: Negative except as noted in the HPI. Denies N/V/F/Ch. ? ? ?Objective:  ?There were no vitals filed for this visit. ?There is no height or weight on file to calculate BMI. ?Constitutional Well developed. ?Well nourished.  ?Vascular Foot warm and well perfused. ?Capillary refill normal to all digits.  Calf is soft and supple, no posterior calf or knee pain,   ?Neurologic Normal speech. ?Oriented to person, place, and time. ?Epicritic sensation to light touch grossly present bilaterally.  ?Dermatologic Incision well healed and non hypertrophic no SOI  ?Orthopedic: Doing well minimal pain good alignment  ? ?Multiple view plain film radiographs: New films taken today show good consolidation across the arthrodesis site with no complication of hardware, good alignment ?Assessment:  ? ?1. Coalition, talocalcaneal   ? ? ?Plan:  ?Patient was evaluated and treated and all questions answered. ? ?S/p foot surgery left ?-Overall doing well and has had quite a bit of improvement.  She can continue regular shoe gear and activity as tolerated and should continue to gradually improve.  She would like to look into getting her hammertoes on the other foot fixed and will return in a couple months for scheduling for this. ?No follow-ups on file.  ?

## 2021-07-23 ENCOUNTER — Ambulatory Visit: Payer: Commercial Managed Care - PPO

## 2021-07-23 NOTE — Therapy (Signed)
?OUTPATIENT PHYSICAL THERAPY TREATMENT NOTE ? ? ?Patient Name: Erica Fox ?MRN: [id number] ?DOB:08-28-1985, 36 y.o., female ?Today's Date: 07/24/2021 ? ?PCP: Patient, No Pcp Per (Inactive) ?REFERRING PROVIDER: Criselda Peaches, DPM ? ?END OF SESSION:  ? PT End of Session - 07/24/21 4627   ? ? Visit Number 7   ? Number of Visits 17   ? Date for PT Re-Evaluation 08/21/21   ? Authorization Type [employer] PPO   ? Progress Note Due on Visit 10   ? PT Start Time 0715   ? PT Stop Time 0757   ? PT Time Calculation (min) 42 min   ? Activity Tolerance Patient tolerated treatment well   ? Behavior During Therapy [facility] for tasks assessed/performed   ? ?  ?  ? ?  ? ? ? ? ? ? ? ?Past Medical History:  ?Diagnosis Date  ? Anemia   ? Degenerative joint disease   ? ?Past Surgical History:  ?Procedure Laterality Date  ? FOOT SURGERY Left   ? ?There are no problems to display for this patient. ? ? ?REFERRING DIAG: Coalition, talocalcaneal;  S/P foot surgery, left ? ?THERAPY DIAG:  ?Pain in left ankle and joints of left foot ? ?Difficulty in walking, not elsewhere classified ? ?Muscle weakness (generalized) ? ?Stiffness of left ankle, not elsewhere classified ? ?SUBJECTIVE:  ?  ?SUBJECTIVE STATEMENT: ?Pt reports her L ankle/foot is doing well. Pt  notes pressure sensation of the ant ankle with DF. ?  ?PAIN:  ?Are you having pain? Yes: NPRS scale: 0/10 ?Pain location: L  foot ?Pain description: ache, trobbing, swellling ?Aggravating factors: prolonged standing and walking ?Relieving factors: RICE ?Up on feet 7/10 ? ?PERTINENT HISTORY: ?Arthritis , obesity ?  ?PRECAUTIONS: I think is safe for her to transition out of the cam boot into a Tri-Lock ankle brace, we reviewed the transition process.  06/10/21 note of Dr.McDonald ?  ?WEIGHT BEARING RESTRICTIONS No ? ?  ?PATIENT GOALS To be more active with less pain to get back to outdoor activity,hiking and fishing ?  ?  ?OBJECTIVE:  ?  ?DIAGNOSTIC FINDINGS: 3/222/23-radiographs today it appears to be  completely fused. ?  ?PATIENT SURVEYS:  ?FOTO 38% 07/17/21 59% ?  ?COGNITION: ?          Overall cognitive status: Within functional limits for tasks assessed               ?           ?SENSATION: ?WFL ?  ?PALPATION: ?TTP L Achilles and lateral foot in area of incision ?  ?LE ROM: ?  ?Active ROM Right ?06/19/2021 Left ?06/19/2021 Lt ?07/10/21  ?Hip flexion       ?Hip extension       ?Hip abduction       ?Hip adduction       ?Hip internal rotation       ?Hip external rotation       ?Knee flexion       ?Knee extension       ?Ankle dorsiflexion   5 8  ?Ankle plantarflexion   15   ?Ankle inversion   8 18  ?Ankle eversion   10 20  ? (Blank rows = not tested) ?  ?LE MMT: ?  ?MMT Right ?06/19/2021 Left ?06/19/2021  ?Hip flexion      ?Hip extension      ?Hip abduction      ?Hip adduction      ?Hip internal rotation      ?  Hip external rotation      ?Knee flexion      ?Knee extension      ?Ankle dorsiflexion   5  ?Ankle plantarflexion   5  ?Ankle inversion   4  ?Ankle eversion   4  ? (Blank rows = not tested) ?  ?FUNCTIONAL TESTS:  ?5 times sit to stand: TBA when indicated ?2 minute walk test: TBA when indicated ?  ?GAIT: ?Distance walked: 161ft within clinic ?Assistive device utilized:  cam boot ?Level of assistance: Complete Independence ?Comments: WNLs in cam boot ?  ?TODAY'S TREATMENT: ?[facility] Adult PT Treatment:                                                DATE: 07/24/21 ?Therapeutic Exercise: ?Rec bike 5 mins L3 ?Push/pull 60# sled 60 ft x3 each direction ?Lat and forward step ups x10 each with 6' and 8" boxes ?Slant board stretches for gasroc and soleus x1 60" ?[place] step downs 2x10 4' box, difficukt with decreased DF ?Standing ankle both feet Inv/Ev at counter 8' x 4 ?4 way ankle c GTB 2x10 ?Hinged hip lifting 25# 2x10 ? ?[facility] PT Treatment:                                                DATE: 07/17/21 ?Therapeutic Exercise: ?Nustep 5 mins L5 ?Ankle DF/PF with rocker borad at [facility] c handsas needed ?Slant board x1 60" ?L  SL stance for balance 5" max time ?Side steps c GTB 68ft x6 ?L lat steps 2x10 on 2' box and 2" airex ?Forward step offs/backward step ups 2x15 ? Cybex hip abd 2x10 25# ?Hinged hip lifting 15# x10, 25# x10 ? ?Self Care: ?FOTO 59% ? ? ?[facility] Adult PT Treatment:                                                DATE: 07/10/21 ?Therapeutic Exercise: ?Rec bike 5 mins L1 ?Standing gastroc stretch c 2x30" ?Standing soleus stretch 2x30" ?Towel inv/Ev 2x10 ?Lateral steps 46ft x 4, decreased step length ?Ankle DF/PF 2x20 each at [facility] ?Lateral step ups with airex on 2" platform 2x15 c hand A at FM ?SLS L  LE 3x30" c hand A at FM ?Bridging 2x10 ?SL L hip clams GTB 2x15 ?  ?PATIENT EDUCATION:  ?Education details: Eval findings, POC, HEP, RICE for symptom management ?Person educated: Patient ?Education method: Explanation, Demonstration, Tactile cues, and Verbal cues ?Education comprehension: verbalized understanding, returned demonstration, verbal cues required, and tactile cues required ?  ?  ?HOME EXERCISE PROGRAM: ?Access Code: [email or web address] ?URL: [email or web address] ?Date: 06/19/2021 ?Prepared by: Gar Ponto ?  ?Exercises ?- Long Sitting Calf Stretch with Strap  - 2 x daily - 7 x weekly - 1 sets - 3 reps - 60 hold ?- Supine Ankle Dorsiflexion and Plantarflexion AROM  - 2 x daily - 7 x weekly - 1 sets - 10 reps ?- Supine Ankle Inversion and Eversion AROM  - 2 x daily - 7 x weekly - 1 sets - 10 reps ?- Supine Ankle Eversion AROM  -  2 x daily - 7 x weekly - 1 sets - 10 reps ?- Supine Ankle Circles  - 2 x daily - 7 x weekly - 1 sets - 10 reps ?- Seated Toe Raise  - 1 x daily - 7 x weekly - 1 sets - 10 reps ?- Seated Heel Raise  - 1 x daily - 7 x weekly - 1 sets - 10 reps ?- Ankle Inversion Eversion Towel Slide  - 1 x daily - 7 x weekly - 1 sets - 10 reps ?- Seated Toe Towel Scrunches  - 1 x daily - 7 x weekly - 1 sets - 10 reps ?- Seated Arch Lifts  - 1 x daily - 7 x weekly - 10 reps ?  ?ASSESSMENT: ?  ?CLINICAL  IMPRESSION: ?Pt participated in PT for L ankle/foot ROM, strength, and function. The demand for strengthening and functional activities was increased with pt tolerating well. Descending steps is limited by decreased DF and this limitation was work on with step down therex from a 4" box. Pt's quality of gait on a level surface continues to improve. Tolerance of the L ankle/foot is also improving with pt functioning s the cam boot or ankle brace since the last PT session. Pt will continue to benefit from skilled PT to address impairments for improved functional mobility. ? ?OBJECTIVE IMPAIRMENTS Abnormal gait, decreased activity tolerance, difficulty walking, decreased ROM, decreased strength, increased edema, impaired flexibility, obesity, and pain.  ?  ?REHAB POTENTIAL: Good  ?  ?GOALS: ?  ?SHORT TERM GOALS: Target date: 07/10/2021 ?  ?Pt will be Ind in an initial HEP  ?Baseline: started on eval ?Goal status: MET ?  ?2.  Pt will voice understanding of measures to assist c symptom management ?Baseline:  ?Goal status: MET ?  ?3.  Pt will initiate walking outside of the cam boot in an ankle brace ?Baseline: Unable ?Goal status: MET ?  ?LONG TERM GOALS: Target date: 08/21/21 ?  ?Increase L ankle Inv and Ev strength to 5/5 for improved L ankle/foot function ?Baseline: 4/5 ?Goal status: INITIAL ?  ?2.  Increase L ankle ROM tolerated for improved L ankle/foot function ?Baseline: See flow sheets ?Goal status: INITIAL ?  ?3.  Increase pt's FOTO score to predicted value of 63%.07/17/21= 59% ?Baseline: 38% ?Goal status: Improved ?  ?4.  Pt will be Ind in a final HEP to maintain achieved LOF ?Baseline: started in eval ?Goal status: INITIAL ?  ?5.  Pt will be able to walk Indly c an ankle brace at an appropriate pace and without a limp  ?Baseline:  ?Goal status: INITIAL ?  ?6.  Set goals higher level functional goals as indicated. 2MWT 457f, 5xSTS 15sec or less, SLS 5 sec ?Baseline:2914f 19.6, 2 sec respectively  ?Goal status:  INITIAL ?  ?  ?PLAN: ?PT FREQUENCY: 2x/week ?  ?PT DURATION: 8 weeks ?  ?PLANNED INTERVENTIONS: Therapeutic exercises, Therapeutic activity, Balance training, Gait training, Patient/Family education, Joint mobilizat

## 2021-07-24 ENCOUNTER — Ambulatory Visit: Payer: Commercial Managed Care - PPO | Attending: Podiatry

## 2021-07-24 DIAGNOSIS — M6281 Muscle weakness (generalized): Secondary | ICD-10-CM | POA: Insufficient documentation

## 2021-07-24 DIAGNOSIS — M25672 Stiffness of left ankle, not elsewhere classified: Secondary | ICD-10-CM | POA: Insufficient documentation

## 2021-07-24 DIAGNOSIS — R262 Difficulty in walking, not elsewhere classified: Secondary | ICD-10-CM | POA: Insufficient documentation

## 2021-07-24 DIAGNOSIS — M25572 Pain in left ankle and joints of left foot: Secondary | ICD-10-CM | POA: Insufficient documentation

## 2021-07-28 NOTE — Therapy (Incomplete)
?OUTPATIENT PHYSICAL THERAPY TREATMENT NOTE ? ? ?Patient Name: Erica Fox ?MRN: [id number] ?DOB:March 24, 1985, 36 y.o., female ?Today's Date: 07/28/2021 ? ?PCP: Patient, No Pcp Per (Inactive) ?REFERRING PROVIDER: Criselda Peaches, DPM ? ?END OF SESSION:  ? ? ? ? ? ? ? ? ?Past Medical History:  ?Diagnosis Date  ? Anemia   ? Degenerative joint disease   ? ?Past Surgical History:  ?Procedure Laterality Date  ? FOOT SURGERY Left   ? ?There are no problems to display for this patient. ? ? ?REFERRING DIAG: Coalition, talocalcaneal;  S/P foot surgery, left ? ?THERAPY DIAG:  ?No diagnosis found. ? ?SUBJECTIVE:  ?  ?SUBJECTIVE STATEMENT: ?Pt reports her L ankle/foot is doing well. Pt  notes pressure sensation of the ant ankle with DF. ?  ?PAIN:  ?Are you having pain? Yes: NPRS scale: 0/10 ?Pain location: L  foot ?Pain description: ache, trobbing, swellling ?Aggravating factors: prolonged standing and walking ?Relieving factors: RICE ?Up on feet 7/10 ? ?PERTINENT HISTORY: ?Arthritis , obesity ?  ?PRECAUTIONS: I think is safe for her to transition out of the cam boot into a Tri-Lock ankle brace, we reviewed the transition process.  06/10/21 note of Dr.McDonald ?  ?WEIGHT BEARING RESTRICTIONS No ? ?  ?PATIENT GOALS To be more active with less pain to get back to outdoor activity,hiking and fishing ?  ?  ?OBJECTIVE:  ?  ?DIAGNOSTIC FINDINGS: 3/222/23-radiographs today it appears to be completely fused. ?  ?PATIENT SURVEYS:  ?FOTO 38% 07/17/21 59% ?  ?COGNITION: ?          Overall cognitive status: Within functional limits for tasks assessed               ?           ?SENSATION: ?WFL ?  ?PALPATION: ?TTP L Achilles and lateral foot in area of incision ?  ?LE ROM: ?  ?Active ROM Right ?06/19/2021 Left ?06/19/2021 Lt ?07/10/21  ?Hip flexion       ?Hip extension       ?Hip abduction       ?Hip adduction       ?Hip internal rotation       ?Hip external rotation       ?Knee flexion       ?Knee extension       ?Ankle dorsiflexion   5 8  ?Ankle  plantarflexion   15   ?Ankle inversion   8 18  ?Ankle eversion   10 20  ? (Blank rows = not tested) ?  ?LE MMT: ?  ?MMT Right ?06/19/2021 Left ?06/19/2021  ?Hip flexion      ?Hip extension      ?Hip abduction      ?Hip adduction      ?Hip internal rotation      ?Hip external rotation      ?Knee flexion      ?Knee extension      ?Ankle dorsiflexion   5  ?Ankle plantarflexion   5  ?Ankle inversion   4  ?Ankle eversion   4  ? (Blank rows = not tested) ?  ?FUNCTIONAL TESTS:  ?5 times sit to stand: TBA when indicated ?2 minute walk test: TBA when indicated ?  ?GAIT: ?Distance walked: 139f within clinic ?Assistive device utilized:  cam boot ?Level of assistance: Complete Independence ?Comments: WNLs in cam boot ?  ?TODAY'S TREATMENT: ?[facility]  DATE: 07/29[facility] ?Therapeutic Exercise: ?*** ?Manual Therapy: ?*** ?Neuromuscular re-ed: ?*** ?Therapeutic Activity: ?*** ?Modalities: ?*** ?Self Care: ?*** ? ? ?[facility] PT Treatment:                                                DATE: 07/24/21 ?Therapeutic Exercise: ?Rec bike 5 mins L3 ?Push/pull 60# sled 60 ft x3 each direction ?Lat and forward step ups x10 each with 6' and 8" boxes ?Slant board stretches for gasroc and soleus x1 60" ?Froward step downs 2x10 4' box, difficukt with decreased DF ?Standing ankle both feet Inv/Ev at counter 8' x 4 ?4 way ankle c GTB 2x10 ?Hinged hip lifting 25# 2x10 ? ?[facility] Adult PT Treatment:                                                DATE: 07/17/21 ?Therapeutic Exercise: ?Nustep 5 mins L5 ?Ankle DF/PF with rocker borad at [facility] c handsas needed ?Slant board x1 60" ?L SL stance for balance 5" max time ?Side steps c GTB 63f x6 ?L lat steps 2x10 on 2' box and 2" airex ?Forward step offs/backward step ups 2x15 ? Cybex hip abd 2x10 25# ?Hinged hip lifting 15# x10, 25# x10 ? ?Self Care: ?FOTO 59% ? ? ?[facility]                                                [facility]:  [facility] ?Therapeutic Exercise: ?Rec bike 5 mins L1 ?Standing gastroc stretch c 2x30" ?Standing soleus stretch 2x30" ?Towel inv/Ev 2x10 ?Lateral steps 141fx 4, decreased step length ?Ankle DF/PF 2x20 each at [facility] step ups with airex on 2" platform 2x15 c hand A at FM ?SLS L  LE 3x30" c hand A at FM ?Bridging 2x10 ?SL L hip clams GTB 2x15 ?  ?PATIENT EDUCATION:  ?Education details: Eval findings, POC, HEP, RICE for symptom management ?Person educated: Patient ?Education method: Explanation, Demonstration, Tactile cues, and Verbal cues ?Education comprehension: verbalized understanding, returned demonstration, verbal cues required, and tactile cues required ?  ?  ?HOME EXERCISE PROGRAM: ?Access Code: [email or web address]: [email or web address] ?Date: 06/19/2021 ?Prepared by: AlGar Ponto  ?Exercises ?- Long Sitting Calf Stretch with Strap  - 2 x daily - 7 x weekly - 1 sets - 3 reps - 60 hold ?- Supine Ankle Dorsiflexion and Plantarflexion AROM  - 2 x daily - 7 x weekly - 1 sets - 10 reps ?- Supine Ankle Inversion and Eversion AROM  - 2 x daily - 7 x weekly - 1 sets - 10 reps ?- Supine Ankle Eversion AROM  - 2 x daily - 7 x weekly - 1 sets - 10 reps ?- Supine Ankle Circles  - 2 x daily - 7 x weekly - 1 sets - 10 reps ?- Seated Toe Raise  - 1 x daily - 7 x weekly - 1 sets - 10 reps ?- Seated Heel Raise  - 1 x daily - 7 x weekly - 1 sets - 10 reps ?- Ankle Inversion Eversion Towel Slide  - 1  x daily - 7 x weekly - 1 sets - 10 reps ?- Seated Toe Towel Scrunches  - 1 x daily - 7 x weekly - 1 sets - 10 reps ?- Seated Arch Lifts  - 1 x daily - 7 x weekly - 10 reps ?  ?ASSESSMENT: ?  ?CLINICAL IMPRESSION: ?Pt participated in PT for L ankle/foot ROM, strength, and function. The demand for strengthening and functional activities was increased with pt tolerating well. Descending steps is limited by decreased DF and this limitation was work on with step down therex from a 4" box. Pt's quality of gait on a level  surface continues to improve. Tolerance of the L ankle/foot is also improving with pt functioning s the cam boot or ankle brace since the last PT session. Pt will continue to benefit from skilled PT to address impairments for improved functional mobility. ? ?OBJECTIVE IMPAIRMENTS Abnormal gait, decreased activity tolerance, difficulty walking, decreased ROM, decreased strength, increased edema, impaired flexibility, obesity, and pain.  ?  ?REHAB POTENTIAL: Good  ?  ?GOALS: ?  ?SHORT TERM GOALS: Target date: 07/10/2021 ?  ?Pt will be Ind in an initial HEP  ?Baseline: started on eval ?Goal status: MET ?  ?2.  Pt will voice understanding of measures to assist c symptom management ?Baseline:  ?Goal status: MET ?  ?3.  Pt will initiate walking outside of the cam boot in an ankle brace ?Baseline: Unable ?Goal status: MET ?  ?LONG TERM GOALS: Target date: 08/21/21 ?  ?Increase L ankle Inv and Ev strength to 5/5 for improved L ankle/foot function ?Baseline: 4/5 ?Goal status: INITIAL ?  ?2.  Increase L ankle ROM tolerated for improved L ankle/foot function ?Baseline: See flow sheets ?Goal status: INITIAL ?  ?3.  Increase pt's FOTO score to predicted value of 63%.07/17/21= 59% ?Baseline: 38% ?Goal status: Improved ?  ?4.  Pt will be Ind in a final HEP to maintain achieved LOF ?Baseline: started in eval ?Goal status: INITIAL ?  ?5.  Pt will be able to walk Indly c an ankle brace at an appropriate pace and without a limp  ?Baseline:  ?Goal status: INITIAL ?  ?6.  Set goals higher level functional goals as indicated. 2MWT 415f, 5xSTS 15sec or less, SLS 5 sec ?Baseline:2951f 19.6, 2 sec respectively  ?Goal status: INITIAL ?  ?  ?PLAN: ?PT FREQUENCY: 2x/week ?  ?PT DURATION: 8 weeks ?  ?PLANNED INTERVENTIONS: Therapeutic exercises, Therapeutic activity, Balance training, Gait training, Patient/Family education, Joint mobilization, Dry Needling, Electrical stimulation, Cryotherapy, Moist heat, Taping, Ultrasound, Ionotophoresis  50m62ml Dexamethasone, and Manual therapy ?  ?PLAN FOR NEXT SESSION: progress therex as indicated, assess 5xSTS and 2MWT ?  ?  ? ?AllGar Ponto, PT ?07/28/21 8:38 PM ? ? ?   ?

## 2021-07-29 ENCOUNTER — Ambulatory Visit: Payer: Commercial Managed Care - PPO

## 2021-07-29 NOTE — Therapy (Addendum)
OUTPATIENT PHYSICAL THERAPY TREATMENT NOTE/Discahrge   Patient Name: Erica Fox MRN: [id number] DOB:1985/11/07, 36 y.o., female Today's Date: 07/30/2021  PCP: Patient, No Pcp Per (Inactive) REFERRING PROVIDER: Criselda Peaches, DPM  END OF SESSION:   PT End of Session - 07/30/21 0734     Visit Number 8    Number of Visits 17    Date for PT Re-Evaluation 08/21/21    Authorization Type [employer] PPO    Progress Note Due on Visit 10    PT Start Time 0715    PT Stop Time 0807    PT Time Calculation (min) 52 min    Activity Tolerance Patient tolerated treatment well    Behavior During Therapy WFL for tasks assessed/performed                   Past Medical History:  Diagnosis Date   Anemia    Degenerative joint disease    Past Surgical History:  Procedure Laterality Date   FOOT SURGERY Left    There are no problems to display for this patient.   REFERRING DIAG: Coalition, talocalcaneal;  S/P foot surgery, left  THERAPY DIAG:  Pain in left ankle and joints of left foot  Difficulty in walking, not elsewhere classified  Muscle weakness (generalized)  SUBJECTIVE:    SUBJECTIVE STATEMENT: Pt reports her L ankle/foot is a little more sore after being up on her feet more yesterday.   PAIN:  Are you having pain? Yes: NPRS scale: 0/10 Pain location: L  foot  Pain description: ache, trobbing, swellling Aggravating factors: prolonged standing and walking Relieving factors: RICE Up on feet 7/10  PERTINENT HISTORY: Arthritis , obesity   PRECAUTIONS: I think is safe for her to transition out of the cam boot into a Tri-Lock ankle brace, we reviewed the transition process.  06/10/21 note of Dr.McDonald   WEIGHT BEARING RESTRICTIONS No    PATIENT GOALS To be more active with less pain to get back to outdoor activity,hiking and fishing     OBJECTIVE:    DIAGNOSTIC FINDINGS: 3/222/23-radiographs today it appears to be completely fused.   PATIENT SURVEYS:   FOTO 38% 07/17/21 59%   COGNITION:           Overall cognitive status: Within functional limits for tasks assessed                          SENSATION: WFL   PALPATION: TTP L Achilles and lateral foot in area of incision   LE ROM:   Active ROM Right 06/19/2021 Left 06/19/2021 Lt 07/10/21  Hip flexion       Hip extension       Hip abduction       Hip adduction       Hip internal rotation       Hip external rotation       Knee flexion       Knee extension       Ankle dorsiflexion   5 8  Ankle plantarflexion   15   Ankle inversion   8 18  Ankle eversion   10 20   (Blank rows = not tested)   LE MMT:   MMT Right 06/19/2021 Left 06/19/2021  Hip flexion      Hip extension      Hip abduction      Hip adduction      Hip internal rotation      Hip external  rotation      Knee flexion      Knee extension      Ankle dorsiflexion   5  Ankle plantarflexion   5  Ankle inversion   4  Ankle eversion   4   (Blank rows = not tested)   FUNCTIONAL TESTS:  5 times sit to stand: 2 minute walk test:    GAIT: Distance walked: 19f within clinic Assistive device utilized:  cam boot Level of assistance: Complete Independence Comments: WNLs in cam boot   [facility] TREATMENT: [facility]                                                DATE: 07/30/21 Therapeutic Exercise: Rec bike 5 mins L3 TM 5 mins 1 MPH, elevated grade as tolerated; Walking against resistance of nn-active TM 1 min Cybex hip abd 3x10 each 30# 5xSTS 10.2 sec 2MWT 457 ft Slant board stretches for gasroc and soleus x2 60"  Modalities: CP to the L ankle /foot x10 mins   [facility] Adult PT Treatment:                                                DATE: 07/24/21 Therapeutic Exercise: Rec bike 5 mins L3 Push/pull 60# sled 60 ft x3 each direction Lat and forward step ups x10 each with 6' and 8" boxes Slant board stretches for gasroc and soleus x1 60" Froward step downs 2x10 4' box, difficukt with decreased  DF Standing ankle both feet Inv/Ev at counter 8' x 4 4 way ankle c GTB 2x10 Hinged hip lifting 25# 2x10  [facility] Adult PT Treatment:                                                DATE: 07/17/21 Therapeutic Exercise: Nustep 5 mins L5 Ankle DF/PF with rocker borad at FM c handsas needed Slant board x1 60" L SL stance for balance 5" max time Side steps c GTB 136fx6 L lat steps 2x10 on 2' box and 2" airex Forward step offs/backward step ups 2x15  Cybex hip abd 2x10 25# Hinged hip lifting 15# x10, 25# x10  Self Care: FOTO 59%   PATIENT EDUCATION:  Education details: Eval findings, POC, HEP, RICE for symptom management Person educated: Patient Education method: Explanation, Demonstration, Tactile cues, and Verbal cues Education comprehension: verbalized understanding, returned demonstration, verbal cues required, and tactile cues required     HOME EXERCISE PROGRAM: Access Code: [email or web address]: [email or web address] Date: 06/19/2021 Prepared by: AlGar Ponto Exercises - Long Sitting Calf Stretch with Strap  - 2 x daily - 7 x weekly - 1 sets - 3 reps - 60 hold - Supine Ankle Dorsiflexion and Plantarflexion AROM  - 2 x daily - 7 x weekly - 1 sets - 10 reps - Supine Ankle Inversion and Eversion AROM  - 2 x daily - 7 x weekly - 1 sets - 10 reps - Supine Ankle Eversion AROM  - 2 x daily - 7 x weekly - 1 sets - 10 reps - Supine Ankle Circles  -  2 x daily - 7 x weekly - 1 sets - 10 reps - Seated Toe Raise  - 1 x daily - 7 x weekly - 1 sets - 10 reps - Seated Heel Raise  - 1 x daily - 7 x weekly - 1 sets - 10 reps - Ankle Inversion Eversion Towel Slide  - 1 x daily - 7 x weekly - 1 sets - 10 reps - Seated Toe Towel Scrunches  - 1 x daily - 7 x weekly - 1 sets - 10 reps - Seated Arch Lifts  - 1 x daily - 7 x weekly - 10 reps   ASSESSMENT:   CLINICAL IMPRESSION: PT was completed for L ankle/foot ROM, strengthening and activity tolerance. Pt is making good gains re: function  as determined today with the 5xSTS and 2MWT meeting these LTGs. Pt will continue to benefit from skilled PT to address impairments to maximize L ankle/foot function.    OBJECTIVE IMPAIRMENTS Abnormal gait, decreased activity tolerance, difficulty walking, decreased ROM, decreased strength, increased edema, impaired flexibility, obesity, and pain.    REHAB POTENTIAL: Good    GOALS:   SHORT TERM GOALS: Target date: 07/10/2021   Pt will be Ind in an initial HEP  Baseline: started on eval Goal status: MET   2.  Pt will voice understanding of measures to assist c symptom management Baseline:  Goal status: MET   3.  Pt will initiate walking outside of the cam boot in an ankle brace Baseline: Unable Goal status: MET   LONG TERM GOALS: Target date: 08/21/21   Increase L ankle Inv and Ev strength to 5/5 for improved L ankle/foot function Baseline: 4/5 Goal status: INITIAL   2.  Increase L ankle ROM tolerated for improved L ankle/foot function Baseline: See flow sheets Goal status: INITIAL   3.  Increase pt's FOTO score to predicted value of 63%.07/17/21= 59% Baseline: 38% Goal status: Improved   4.  Pt will be Ind in a final HEP to maintain achieved LOF Baseline: started in eval Goal status: INITIAL   5.  Pt will be able to walk Indly c an ankle brace at an appropriate pace and without a limp  Baseline:  Goal status: INITIAL   6.  Set goals higher level functional goals as indicated. 2MWT 426f, 5xSTS 15sec or less, SLS 5 sec. 07/30/21: 2MWT 4533f 5xSTS 10.2sec Baseline:29041f19.6, 2 sec respectively  Goal status: MET     PLAN: PT FREQUENCY: 2x/week   PT DURATION: 8 weeks   PLANNED INTERVENTIONS: Therapeutic exercises, Therapeutic activity, Balance training, Gait training, Patient/Family education, Joint mobilization, Dry Needling, Electrical stimulation, Cryotherapy, Moist heat, Taping, Ultrasound, Ionotophoresis 4mg37m Dexamethasone, and Manual therapy   PLAN FOR NEXT  SESSION: progress therex as indicated. Reassess FOTO.      Kyara Boxer MS, PT 07/30/21 1:30 PM   PHYSICAL THERAPY DISCHARGE SUMMARY  Visits from Start of Care: 8  Current functional level related to goals / functional outcomes: See above   Remaining deficits: See above   Education / Equipment: HEP   Patient agrees to discharge. Patient goals were partially met. Patient is being discharged due to not returning since the last visit.

## 2021-07-30 ENCOUNTER — Ambulatory Visit: Payer: Commercial Managed Care - PPO

## 2021-07-30 DIAGNOSIS — M25572 Pain in left ankle and joints of left foot: Secondary | ICD-10-CM | POA: Diagnosis not present

## 2021-07-30 DIAGNOSIS — M6281 Muscle weakness (generalized): Secondary | ICD-10-CM

## 2021-07-30 DIAGNOSIS — R262 Difficulty in walking, not elsewhere classified: Secondary | ICD-10-CM

## 2021-08-04 ENCOUNTER — Ambulatory Visit: Payer: Commercial Managed Care - PPO

## 2021-08-04 NOTE — Therapy (Incomplete)
?OUTPATIENT PHYSICAL THERAPY TREATMENT NOTE ? ? ?Patient Name: Erica Fox ?MRN: [id number] ?DOB:1985/12/04, 36 y.o., female ?Today's Date: 08/04/2021 ? ?PCP: Patient, No Pcp Per (Inactive) ?REFERRING PROVIDER: Criselda Peaches, DPM ? ?END OF SESSION:  ? ? ? ? ? ? ? ? ? ?Past Medical History:  ?Diagnosis Date  ? Anemia   ? Degenerative joint disease   ? ?Past Surgical History:  ?Procedure Laterality Date  ? FOOT SURGERY Left   ? ?There are no problems to display for this patient. ? ? ?REFERRING DIAG: Coalition, talocalcaneal;  S/P foot surgery, left ? ?THERAPY DIAG:  ?No diagnosis found. ? ?SUBJECTIVE:  ?  ?SUBJECTIVE STATEMENT: ?Pt reports her L ankle/foot is a little more sore after being up on her feet more yesterday. ?  ?PAIN:  ?Are you having pain? Yes: NPRS scale: 0/10 ?Pain location: L  foot  ?Pain description: ache, trobbing, swellling ?Aggravating factors: prolonged standing and walking ?Relieving factors: RICE ?Up on feet 7/10 ? ?PERTINENT HISTORY: ?Arthritis , obesity ?  ?PRECAUTIONS: I think is safe for her to transition out of the cam boot into a Tri-Lock ankle brace, we reviewed the transition process.  06/10/21 note of Dr.McDonald ?  ?WEIGHT BEARING RESTRICTIONS No ? ?  ?PATIENT GOALS To be more active with less pain to get back to outdoor activity,hiking and fishing ?  ?  ?OBJECTIVE:  ?  ?DIAGNOSTIC FINDINGS: 3/222/23-radiographs today it appears to be completely fused. ?  ?PATIENT SURVEYS:  ?FOTO 38% 07/17/21 59% ?  ?COGNITION: ?          Overall cognitive status: Within functional limits for tasks assessed               ?           ?SENSATION: ?WFL ?  ?PALPATION: ?TTP L Achilles and lateral foot in area of incision ?  ?LE ROM: ?  ?Active ROM Right ?06/19/2021 Left ?06/19/2021 Lt ?07/10/21  ?Hip flexion       ?Hip extension       ?Hip abduction       ?Hip adduction       ?Hip internal rotation       ?Hip external rotation       ?Knee flexion       ?Knee extension       ?Ankle dorsiflexion   5 8  ?Ankle  plantarflexion   15   ?Ankle inversion   8 18  ?Ankle eversion   10 20  ? (Blank rows = not tested) ?  ?LE MMT: ?  ?MMT Right ?06/19/2021 Left ?06/19/2021  ?Hip flexion      ?Hip extension      ?Hip abduction      ?Hip adduction      ?Hip internal rotation      ?Hip external rotation      ?Knee flexion      ?Knee extension      ?Ankle dorsiflexion   5  ?Ankle plantarflexion   5  ?Ankle inversion   4  ?Ankle eversion   4  ? (Blank rows = not tested) ?  ?FUNCTIONAL TESTS:  ?5 times sit to stand: ?2 minute walk test:  ?  ?GAIT: ?Distance walked: 164f within clinic ?Assistive device utilized:  cam boot ?Level of assistance: Complete Independence ?Comments: WNLs in cam boot ?  ?TOD[facility]'S TREATMENT: ?[facility]  [facility] ?Therapeutic Exercise: ?*** ?Manual Therapy: ?*** ?Neuromuscular re-ed: ?*** ?Therapeutic Activity: ?*** ?Modalities: ?*** ?Self Care: ?*** ? ?[facility] Adult PT Treatment:                                                DATE: 07/30/21 ?Therapeutic Exercise: ?Rec bike 5 mins L3 ?TM 5 mins 1 MPH, elevated grade as tolerated; Walking against resistance of nn-active TM 1 min ?Cybex hip abd 3x10 each 30# ?5xSTS 10.2 sec ?2MWT 457 ft ?Slant board stretches for gasroc and soleus x2 60" ? ?Modalities: ?CP to the L ankle /foot x10 mins ? ? ?[facility] Adult PT Treatment:                                                DATE: 07/24/21 ?Therapeutic Exercise: ?Rec bike 5 mins L3 ?Push/pull 60# sled 60 ft x3 each direction ?Lat and forward step ups x10 each with 6' and 8" boxes ?Slant board stretches for gasroc and soleus x1 60" ?Froward step downs 2x10 4' box, difficukt with decreased DF ?Standing ankle both feet Inv/Ev at counter 8' x 4 ?4 way ankle c GTB 2x10 ?Hinged hip lifting 25# 2x10 ? ?[facility] Adult PT Treatment:                                                DATE: 07/17/21 ?Therapeutic Exercise: ?Nustep 5 mins L5 ?Ankle DF/PF with rocker borad at [facility] c handsas  needed ?Slant board x1 60" ?L SL stance for balance 5" max time ?Side steps c GTB 51ft x6 ?L lat steps 2x10 on 2' box and 2" airex ?Forward step offs/backward step ups 2x15 ? Cybex hip abd 2x10 25# ?Hinged hip lifting 15# x10, 25# x10 ? ?Self Care: ?FOTO 59% ?  ?PATIENT EDUCATION:  ?Education details: Eval findings, POC, HEP, RICE for symptom management ?Person educated: Patient ?Education method: Explanation, Demonstration, Tactile cues, and Verbal cues ?Education comprehension: verbalized understanding, returned demonstration, verbal cues required, and tactile cues required ?  ?  ?HOME EXERCISE PROGRAM: ?Access Code: [email or web address] ?URL: [email or web address] ?Date: 06/19/2021 ?Prepared by: Gar Ponto ?  ?Exercises ?- Long Sitting Calf Stretch with Strap  - 2 x daily - 7 x weekly - 1 sets - 3 reps - 60 hold ?- Supine Ankle Dorsiflexion and Plantarflexion AROM  - 2 x daily - 7 x weekly - 1 sets - 10 reps ?- Supine Ankle Inversion and Eversion AROM  - 2 x daily - 7 x weekly - 1 sets - 10 reps ?- Supine Ankle Eversion AROM  - 2 x daily - 7 x weekly - 1 sets - 10 reps ?- Supine Ankle Circles  - 2 x daily - 7 x weekly - 1 sets - 10 reps ?- Seated Toe Raise  - 1 x daily - 7 x weekly - 1 sets - 10 reps ?- Seated Heel Raise  - 1 x daily - 7 x weekly - 1 sets - 10 reps ?- Ankle Inversion Eversion Towel Slide  - 1 x daily - 7 x weekly - 1 sets - 10  reps ?- Seated Toe Towel Scrunches  - 1 x daily - 7 x weekly - 1 sets - 10 reps ?- Seated Arch Lifts  - 1 x daily - 7 x weekly - 10 reps ?  ?ASSESSMENT: ?  ?CLINICAL IMPRESSION: ?PT was completed for L ankle/foot ROM, strengthening and activity tolerance. Pt is making good gains re: function as determined today with the 5xSTS and 2MWT meeting these LTGs. Pt will continue to benefit from skilled PT to address impairments to maximize L ankle/foot function.   ? ?OBJECTIVE IMPAIRMENTS Abnormal gait, decreased activity tolerance, difficulty walking, decreased ROM, decreased  strength, increased edema, impaired flexibility, obesity, and pain.  ?  ?REHAB POTENTIAL: Good  ?  ?GOALS: ?  ?SHORT TERM GOALS: Target date: 07/10/2021 ?  ?Pt will be Ind in an initial HEP  ?Baseline: started on eval ?Goal status: MET ?  ?2.  Pt will voice understanding of measures to assist c symptom management ?Baseline:  ?Goal status: MET ?  ?3.  Pt will initiate walking outside of the cam boot in an ankle brace ?Baseline: Unable ?Goal status: MET ?  ?LONG TERM GOALS: Target date: 08/21/21 ?  ?Increase L ankle Inv and Ev strength to 5/5 for improved L ankle/foot function ?Baseline: 4/5 ?Goal status: INITIAL ?  ?2.  Increase L ankle ROM tolerated for improved L ankle/foot function ?Baseline: See flow sheets ?Goal status: INITIAL ?  ?3.  Increase pt's FOTO score to predicted value of 63%.07/17/21= 59% ?Baseline: 38% ?Goal status: Improved ?  ?4.  Pt will be Ind in a final HEP to maintain achieved LOF ?Baseline: started in eval ?Goal status: INITIAL ?  ?5.  Pt will be able to walk Indly c an ankle brace at an appropriate pace and without a limp  ?Baseline:  ?Goal status: INITIAL ?  ?6.  Set goals higher level functional goals as indicated. 2MWT 449f, 5xSTS 15sec or less, SLS 5 sec. 07/30/21: 2MWT 45102f 5xSTS 10.2sec ?Baseline:29016f19.6, 2 sec respectively  ?Goal status: MET ?  ?  ?PLAN: ?PT FREQUENCY: 2x/week ?  ?PT DURATION: 8 weeks ?  ?PLANNED INTERVENTIONS: Therapeutic exercises, Therapeutic activity, Balance training, Gait training, Patient/Family education, Joint mobilization, Dry Needling, Electrical stimulation, Cryotherapy, Moist heat, Taping, Ultrasound, Ionotophoresis 4mg68m Dexamethasone, and Manual therapy ?  ?PLAN FOR NEXT SESSION: progress therex as indicated. Reassess FOTO. ?  ?  ? ?AlleGar Ponto PT ?08/04/21 9:20 AM  ? ? ?   ?

## 2021-08-05 ENCOUNTER — Ambulatory Visit: Payer: Commercial Managed Care - PPO

## 2021-08-12 ENCOUNTER — Encounter: Payer: Commercial Managed Care - PPO | Admitting: Radiology

## 2021-08-12 ENCOUNTER — Ambulatory Visit: Payer: Commercial Managed Care - PPO

## 2021-10-26 DIAGNOSIS — M545 Low back pain, unspecified: Secondary | ICD-10-CM | POA: Diagnosis not present

## 2021-12-15 DIAGNOSIS — Z30430 Encounter for insertion of intrauterine contraceptive device: Secondary | ICD-10-CM | POA: Diagnosis not present

## 2022-02-05 DIAGNOSIS — M130 Polyarthritis, unspecified: Secondary | ICD-10-CM | POA: Diagnosis not present

## 2022-03-11 ENCOUNTER — Ambulatory Visit: Payer: BC Managed Care – PPO | Admitting: Family Medicine

## 2022-03-11 ENCOUNTER — Encounter: Payer: Self-pay | Admitting: Family Medicine

## 2022-03-11 VITALS — BP 128/84 | HR 80 | Temp 97.6°F | Ht 68.0 in | Wt 265.0 lb

## 2022-03-11 DIAGNOSIS — F419 Anxiety disorder, unspecified: Secondary | ICD-10-CM

## 2022-03-11 DIAGNOSIS — M25562 Pain in left knee: Secondary | ICD-10-CM

## 2022-03-11 DIAGNOSIS — M79641 Pain in right hand: Secondary | ICD-10-CM

## 2022-03-11 DIAGNOSIS — F32A Depression, unspecified: Secondary | ICD-10-CM

## 2022-03-11 DIAGNOSIS — D229 Melanocytic nevi, unspecified: Secondary | ICD-10-CM | POA: Diagnosis not present

## 2022-03-11 DIAGNOSIS — G8929 Other chronic pain: Secondary | ICD-10-CM | POA: Diagnosis not present

## 2022-03-11 DIAGNOSIS — M25561 Pain in right knee: Secondary | ICD-10-CM

## 2022-03-11 DIAGNOSIS — M1990 Unspecified osteoarthritis, unspecified site: Secondary | ICD-10-CM | POA: Diagnosis not present

## 2022-03-11 DIAGNOSIS — M79642 Pain in left hand: Secondary | ICD-10-CM | POA: Diagnosis not present

## 2022-03-11 LAB — COMPREHENSIVE METABOLIC PANEL
ALT: 13 U/L (ref 0–35)
AST: 14 U/L (ref 0–37)
Albumin: 4.8 g/dL (ref 3.5–5.2)
Alkaline Phosphatase: 66 U/L (ref 39–117)
BUN: 13 mg/dL (ref 6–23)
CO2: 23 mEq/L (ref 19–32)
Calcium: 10.1 mg/dL (ref 8.4–10.5)
Chloride: 107 mEq/L (ref 96–112)
Creatinine, Ser: 0.64 mg/dL (ref 0.40–1.20)
GFR: 112.61 mL/min (ref >60.00)
Glucose, Bld: 90 mg/dL (ref 70–99)
Potassium: 4.1 mEq/L (ref 3.5–5.1)
Sodium: 137 mEq/L (ref 135–145)
Total Bilirubin: 0.5 mg/dL (ref 0.2–1.2)
Total Protein: 8.3 g/dL (ref 6.0–8.3)

## 2022-03-11 LAB — T4, FREE: Free T4: 0.81 ng/dL (ref 0.60–1.60)

## 2022-03-11 LAB — CBC WITH DIFFERENTIAL/PLATELET
Basophils Absolute: 0.1 10*3/uL (ref 0.0–0.1)
Basophils Relative: 0.7 % (ref 0.0–3.0)
Eosinophils Absolute: 0.3 10*3/uL (ref 0.0–0.7)
Eosinophils Relative: 3.7 % (ref 0.0–5.0)
HCT: 41.6 % (ref 36.0–46.0)
Hemoglobin: 13.8 g/dL (ref 12.0–15.0)
Lymphocytes Relative: 39.3 % (ref 12.0–46.0)
Lymphs Abs: 3.4 10*3/uL (ref 0.7–4.0)
MCHC: 33.2 g/dL (ref 30.0–36.0)
MCV: 87.6 fl (ref 78.0–100.0)
Monocytes Absolute: 0.5 10*3/uL (ref 0.1–1.0)
Monocytes Relative: 5.4 % (ref 3.0–12.0)
Neutro Abs: 4.4 10*3/uL (ref 1.4–7.7)
Neutrophils Relative %: 50.9 % (ref 43.0–77.0)
Platelets: 207 10*3/uL (ref 150.0–400.0)
RBC: 4.75 Mil/uL (ref 3.87–5.11)
RDW: 13.5 % (ref 11.5–15.5)
WBC: 8.6 10*3/uL (ref 4.0–10.5)

## 2022-03-11 LAB — C-REACTIVE PROTEIN: CRP: <1 mg/dL (ref 0.5–20.0)

## 2022-03-11 LAB — TSH: TSH: 3.43 u[IU]/mL (ref 0.35–5.50)

## 2022-03-11 LAB — SEDIMENTATION RATE: Sed Rate: 46 mm/hr — ABNORMAL HIGH (ref 0–20)

## 2022-03-11 NOTE — Progress Notes (Signed)
New Patient Office Visit  Subjective    Patient ID: Erica Fox, female    DOB: Jul 24, 1985  Age: 36 y.o. MRN: [id number]  CC:  Chief Complaint  Patient presents with   Establish Care    Rheumatoid arthritis and needs referral.  Dark spots on skin (neck and face) recently.  Would also like to discuss anxiety.    HPI Erica Fox presents to establish care No PCP in 3 years.   She has an OB/GYN  States she was diagnosed with JRA as a child but then told she did not have it by a rheumatologist in [place].    Dr. Josem Kaufmann - rheumatologist in [place]   Bilateral knee pain  Fingers on right hand are painful, locking up. Hot water helps some.   C/o anxiety for years and it is worsening   Weight gain.   2 moles that are fairly new. One on her right cheek and one on her neck.    She was adopted.   Denies fever, chills, dizziness, chest pain, palpitations, shortness of breath, abdominal pain, N/V/D, urinary symptoms, LE edema.       03/11/2022    2:53 PM  Depression screen PHQ 2/9  Decreased Interest 1  Down, Depressed, Hopeless 1  PHQ - 2 Score 2  Altered sleeping 0  Tired, decreased energy 2  Change in appetite 3  Feeling bad or failure about yourself  1  Trouble concentrating 2  Moving slowly or fidgety/restless 3  Suicidal thoughts 0  PHQ-9 Score 13        Outpatient Encounter Medications as of 03/11/2022  Medication Sig   [DISCONTINUED] albuterol (VENTOLIN HFA) 108 (90 Base) MCG/ACT inhaler Inhale 2 puffs into the lungs every 4 (four) hours as needed for wheezing or shortness of breath.   [DISCONTINUED] doxycycline (VIBRA-TABS) 100 MG tablet Take 1 tablet (100 mg total) by mouth 2 (two) times daily.   [DISCONTINUED] gabapentin (NEURONTIN) 300 MG capsule Take 1 capsule (300 mg total) by mouth 3 (three) times daily for 7 days.   [DISCONTINUED] ondansetron (ZOFRAN) 4 MG tablet Take 1 tablet (4 mg total) by mouth every 6 (six) hours.   [DISCONTINUED]  rivaroxaban (XARELTO) 10 MG TABS tablet Take 1 tablet (10 mg total) by mouth daily.   No facility-administered encounter medications on file as of 03/11/2022.    Past Medical History:  Diagnosis Date   Anemia    Degenerative joint disease     Past Surgical History:  Procedure Laterality Date   FOOT SURGERY Left     History reviewed. No pertinent family history.  Social History   Socioeconomic History   Marital status: Married    Spouse name: Not on file   Number of children: Not on file   Years of education: Not on file   Highest education level: Not on file  Occupational History   Not on file  Tobacco Use   Smoking status: Never   Smokeless tobacco: Never  Vaping Use   Vaping Use: Never used  Substance and Sexual Activity   Alcohol use: Not Currently   Drug use: Not Currently   Sexual activity: Not on file  Other Topics Concern   Not on file  Social History Narrative   Not on file   Social Determinants of Health   Financial Resource Strain: Not on file  Food Insecurity: Not on file  Transportation Needs: Not on file  Physical Activity: Not on file  Stress: Not on  file  Social Connections: Not on file  Intimate Partner Violence: Not on file    ROS      Objective    BP 128/84 (BP Location: Left Arm, Patient Position: Sitting, Cuff Size: Large)   Pulse 80   Temp 97.6 F (36.4 C) (Temporal)   Ht '5\' 8"'$  (1.727 m)   Wt 265 lb (120.2 kg)   SpO2 99%   BMI 40.29 kg/m   Physical Exam Constitutional:      General: She is not in acute distress.    Appearance: She is not ill-appearing.  Cardiovascular:     Rate and Rhythm: Normal rate and regular rhythm.  Pulmonary:     Effort: Pulmonary effort is normal.     Breath sounds: Normal breath sounds.  Skin:    General: Skin is warm and dry.  Neurological:     General: No focal deficit present.     Mental Status: She is alert and oriented to person, place, and time.  Psychiatric:        Mood and  Affect: Mood normal.        Behavior: Behavior normal.         Assessment & Plan:   Problem List Items Addressed This Visit   None Visit Diagnoses     Anxiety and depression    -  Primary   Relevant Orders   TSH   T4, free   Obesity, morbid, BMI 40.0-49.9 (HCC)       Relevant Orders   CBC with Differential/Platelet   Comprehensive metabolic panel   TSH   T4, free   Hemoglobin A1c   Atypical nevi       Bilateral hand pain       Relevant Orders   CBC with Differential/Platelet   Comprehensive metabolic panel   Sedimentation rate   Rheumatoid factor   C-reactive protein   ANA   Chronic pain of both knees       Relevant Orders   CBC with Differential/Platelet   Comprehensive metabolic panel   Sedimentation rate   Rheumatoid factor   C-reactive protein   ANA   Arthritis       Relevant Orders   Sedimentation rate   Rheumatoid factor   C-reactive protein   ANA      Pleasant 36 year old female who is new to the practice and here to establish care.  Hx of ?RA. I will request medical records from rheumatologist in [place].  Check labs today including ANA, RF, CRP, sed rate.  Recommend counseling for anxiety.  She will call and schedule with a dermatologist.  Follow up pending labs or after I receive her medical records.     Return for pending labs.   Harland Dingwall, NP-C

## 2022-03-11 NOTE — Patient Instructions (Signed)
Dermatology offices  [facility]: [phone] Address: [address], [place], [place] [address] [place], [place] [address]  [facility]: Phone #: [phone] Address: [address], [place], [place] [address]  [facility]: Phone: [phone]  Address: [address], [place], [place] [address]  [facility] Address: [address], [place],  [address] Phone: [phone] 

## 2022-03-12 LAB — HEMOGLOBIN A1C: Hgb A1c MFr Bld: 5.3 % (ref 4.6–6.5)

## 2022-03-12 LAB — RHEUMATOID FACTOR: Rheumatoid fact SerPl-aCnc: <14 IU/mL (ref <14)

## 2022-03-12 LAB — ANA: Anti Nuclear Antibody (ANA): NEGATIVE

## 2022-03-15 ENCOUNTER — Encounter: Payer: Self-pay | Admitting: Family Medicine

## 2022-03-15 DIAGNOSIS — G8929 Other chronic pain: Secondary | ICD-10-CM

## 2022-03-15 DIAGNOSIS — M1990 Unspecified osteoarthritis, unspecified site: Secondary | ICD-10-CM

## 2022-03-15 DIAGNOSIS — M79642 Pain in left hand: Secondary | ICD-10-CM

## 2022-03-17 ENCOUNTER — Ambulatory Visit (INDEPENDENT_AMBULATORY_CARE_PROVIDER_SITE_OTHER): Payer: BC Managed Care – PPO

## 2022-03-17 ENCOUNTER — Ambulatory Visit: Payer: BC Managed Care – PPO | Admitting: Podiatry

## 2022-03-17 DIAGNOSIS — Q6689 Other  specified congenital deformities of feet: Secondary | ICD-10-CM

## 2022-03-17 DIAGNOSIS — M898X7 Other specified disorders of bone, ankle and foot: Secondary | ICD-10-CM

## 2022-03-17 DIAGNOSIS — Z9889 Other specified postprocedural states: Secondary | ICD-10-CM

## 2022-03-17 DIAGNOSIS — M2041 Other hammer toe(s) (acquired), right foot: Secondary | ICD-10-CM

## 2022-03-17 DIAGNOSIS — M25572 Pain in left ankle and joints of left foot: Secondary | ICD-10-CM

## 2022-03-19 ENCOUNTER — Ambulatory Visit (INDEPENDENT_AMBULATORY_CARE_PROVIDER_SITE_OTHER): Payer: BC Managed Care – PPO | Admitting: Physician Assistant

## 2022-03-19 ENCOUNTER — Ambulatory Visit: Payer: Self-pay

## 2022-03-19 ENCOUNTER — Ambulatory Visit (INDEPENDENT_AMBULATORY_CARE_PROVIDER_SITE_OTHER): Payer: BC Managed Care – PPO

## 2022-03-19 ENCOUNTER — Encounter: Payer: Self-pay | Admitting: Physician Assistant

## 2022-03-19 DIAGNOSIS — G8929 Other chronic pain: Secondary | ICD-10-CM | POA: Diagnosis not present

## 2022-03-19 DIAGNOSIS — M25561 Pain in right knee: Secondary | ICD-10-CM

## 2022-03-19 DIAGNOSIS — M79642 Pain in left hand: Secondary | ICD-10-CM | POA: Diagnosis not present

## 2022-03-19 DIAGNOSIS — M79641 Pain in right hand: Secondary | ICD-10-CM

## 2022-03-19 DIAGNOSIS — M25562 Pain in left knee: Secondary | ICD-10-CM

## 2022-03-19 MED ORDER — METHYLPREDNISOLONE ACETATE 40 MG/ML IJ SUSP
13.3300 mg | INTRAMUSCULAR | Status: AC | PRN
  Administered 2022-03-19: 13.33 mg via INTRA_ARTICULAR

## 2022-03-19 MED ORDER — BUPIVACAINE HCL 0.25 % IJ SOLN
0.6600 mL | INTRAMUSCULAR | Status: AC | PRN
  Administered 2022-03-19: .66 mL via INTRA_ARTICULAR

## 2022-03-19 MED ORDER — LIDOCAINE HCL 1 % IJ SOLN
3.0000 mL | INTRAMUSCULAR | Status: AC | PRN
  Administered 2022-03-19: 3 mL

## 2022-03-19 MED ORDER — TRAMADOL HCL 50 MG PO TABS: 50.0000 mg | ORAL_TABLET | Freq: Three times a day (TID) | ORAL | 2 refills | Status: DC | PRN

## 2022-03-19 NOTE — Progress Notes (Signed)
Office Visit Note   Patient: Erica Fox           Date of Birth: June 20, 1985           MRN: [id number] Visit Date: 03/19/2022              Requested by: Girtha Rm, NP-C [place],  [place] [address] PCP: Girtha Rm, NP-C   Assessment & Plan: Visit Diagnoses:  1. Bilateral hand pain   2. Chronic pain of both knees     Plan: Impression is bilateral hand and knee arthritis right greater than left.  I believe the patient certainly has an autoimmune disease given the degree of arthritic change on x-ray in addition to her past medical history.  I would like to make referral to rheumatology for which she is agreeable to.  In regards to the hands, we have discussed trying topical Voltaren.  In regards to the knees, we proceeded with bilateral knee cortisone injections.  She tolerated these well.  She will follow-up with [place] as needed.  Follow-Up Instructions: Return if symptoms worsen or fail to improve.   Orders:  Orders Placed This Encounter  Procedures   Large Joint Inj: bilateral knee   XR Hand Complete Left   XR Hand Complete Right   XR KNEE 3 VIEW LEFT   XR KNEE 3 VIEW RIGHT   Ambulatory referral to Rheumatology   Meds ordered this encounter  Medications   traMADol (ULTRAM) 50 MG tablet    Sig: Take 1 tablet (50 mg total) by mouth 3 (three) times daily as needed.    Dispense:  60 tablet    Refill:  2      Procedures: Large Joint Inj: bilateral knee on 03/19/2022 2:41 PM Indications: pain Details: 22 G needle, anterolateral approach Medications (Right): 0.66 mL bupivacaine 0.25 %; 3 mL lidocaine 1 %; 13.33 mg methylPREDNISolone acetate 40 MG/ML Medications (Left): 0.66 mL bupivacaine 0.25 %; 3 mL lidocaine 1 %; 13.33 mg methylPREDNISolone acetate 40 MG/ML      Clinical Data: No additional findings.   Subjective: Chief Complaint  Patient presents with   Left Knee - Pain   Right Knee - Pain   Right Hand - Pain   Left Hand - Pain     HPI patient is a very pleasant 36 year old female who comes in today with bilateral hand pain right greater than left and bilateral knee pain right greater than left.  In regards to her hands, the pain she has is primarily to all of the PIP joints.  She notes that they become stiff and lock up at times.  She does note she gets some relief when she runs them under hot water.  She has tried NSAIDs without relief.  She denies any paresthesias.  She does note a history of juvenile rheumatoid arthritis when she was 64 to 36 years old where she was medically treated.  She was then treated for adult RA on medicine such as methotrexate.  She was last seen by her rheumatologist in [place] about 7 years ago.  She somewhat neglected to take care of this over the past several years but followed up with her PCP recently who obtained lab work.  Patient notes lab work was essentially unremarkable with exception of inflammatory markers and was therefore not referred to rheumatology.  In regards to her knees, she has pain to both knees right greater than left.  Symptoms are described as stiffness which  is worse going from seated to standing position as well as with descending stairs.  She denies any locking or catching.  She thinks she may have undergone cortisone injection in the past but is unsure.  Review of Systems as detailed in HPI.  All others reviewed and are negative.   Objective: Vital Signs: There were no vitals taken for this visit.  Physical Exam well-developed well-nourished female no acute distress.  Alert and oriented x 3.  Ortho Exam bilateral hand exam reveals mild swelling to the PIP joints.  She is able to fully extend and flex through these joints.  Bilateral knee exam shows range of motion of 0 to 110 degrees.  Medial joint line tenderness.  Moderate patellofemoral crepitus.  She is neurovascular intact distally.  Specialty Comments:  No specialty comments available.  Imaging: XR KNEE 3  VIEW RIGHT  Result Date: 03/19/2022 Moderate degenerative changes the medial and patellofemoral compartments  XR KNEE 3 VIEW LEFT  Result Date: 03/19/2022 Moderate degenerative changes the medial and patellofemoral compartments  XR Hand Complete Right  Result Date: 03/19/2022 Mild diffuse arthritic change to the PIP joints.  Significant degenerative changes to the wrist joint and carpals  XR Hand Complete Left  Result Date: 03/19/2022 Mild diffuse arthritic change to the PIP joints.  Significant degenerative changes to the wrist joint and carpals    PMFS History: There are no problems to display for this patient.  Past Medical History:  Diagnosis Date   Anemia    Degenerative joint disease     History reviewed. No pertinent family history.  Past Surgical History:  Procedure Laterality Date   FOOT SURGERY Left    Social History   Occupational History   Not on file  Tobacco Use   Smoking status: Never   Smokeless tobacco: Never  Vaping Use   Vaping Use: Never used  Substance and Sexual Activity   Alcohol use: Not Currently   Drug use: Not Currently   Sexual activity: Not on file

## 2022-03-21 ENCOUNTER — Encounter: Payer: Self-pay | Admitting: Podiatry

## 2022-03-21 NOTE — Progress Notes (Signed)
Subjective:  Patient ID: Erica Fox, female    DOB: 05-25-1985,  MRN: [id number]  Chief Complaint  Patient presents with   Foot Pain    Follow up left foot      36 y.o. female returns for surgery follow-up on her left foot.  She still having some stiffness and tenderness worse at the end of the day with prolonged activity.  Overall better than it was, but some days feels like it hurts it did before surgery.  She is also interested in having the hammertoes on the right foot corrected they are becoming more painful.  The left foot hammertoes are doing very well after surgery  Review of Systems: Negative except as noted in the HPI. Denies N/V/F/Ch.   Objective:  There were no vitals filed for this visit. There is no height or weight on file to calculate BMI. Constitutional Well developed. Well nourished.  Vascular Foot warm and well perfused. Capillary refill normal to all digits.  Calf is soft and supple, no posterior calf or knee pain,   Neurologic Normal speech. Oriented to person, place, and time. Epicritic sensation to light touch grossly present bilaterally.  Dermatologic Incision well healed and non hypertrophic no SOI  Orthopedic: Minor tenderness in the central portion of incision over sinus tarsi, no increased motion or loss of correction, edema is minimal, she has semireducible hammertoes on the right foot that are tender   Multiple view plain film radiographs: New films taken today complete fusion across arthrodesis site on the oblique view there are some cysts or lucencies in the anterior calcaneus  Previous right foot radiographs show hammertoe deformities of the lesser toes Assessment:   1. Coalition, talocalcaneal   2. S/P foot surgery, left   3. Exostosis of left foot   4. Sinus tarsi syndrome of left ankle   5. Hammertoe of right foot     Plan:  Patient was evaluated and treated and all questions answered.  S/p foot surgery left -Overall from surgical  standpoint is still clinically doing fairly well.  She does have some tenderness and pain mostly was centered over the scar and this could be scar tissue in the sinus tarsi.  I recommended corticosteroid injection of this area.  Following sterile prep with alcohol 10 milligrams of Kenalog and 4 mg of dexamethasone with 2.5 cc of Marcaine half percent plain was injected into this area.  She tolerated this well.  There are some areas on the oblique view that appear to show some cystic changes in the anterior process of the calcaneus there is no complication of hardware or loss of correction or resorption of bone.  I discussed with her if it does not improve I would recommend a CT scan to evaluate further.  Do not think hardware is causing the issue but could be removed at this point   We again discussed the etiology and treatment options of hammertoe deformities including surgical and nonsurgical treatment.  She did very well with lesser digital arthrodeses of the lesser toes on the left foot.  She would like to plan this for the right foot now.  We discussed the risk benefits and potential complications including but not limited to  pain, swelling, infection, scar, numbness which may be temporary or permanent, chronic pain, stiffness, nerve pain or damage, wound healing problems, bone healing problems including delayed or non-union.  We discussed the postoperative protocol.  Informed consent was signed and reviewed today.   Surgical plan:  Procedure: -Hammertoe  deformity correction 2 through 5 on right foot  Location: -[facility]  Anesthesia plan: -IV sedation with regional block  Postoperative pain plan: - Tylenol 1000 mg every 6 hours, ibuprofen 600 mg every 6 hours, gabapentin 300 mg every 8 hours x5 days, oxycodone 5 mg 1-2 tabs every 6 hours only as needed  DVT prophylaxis: -None required  WB Restrictions / DME needs: -WBAT in CAM boot     No follow-ups on file.

## 2022-04-08 ENCOUNTER — Telehealth: Payer: Self-pay | Admitting: Podiatry

## 2022-04-08 NOTE — Telephone Encounter (Signed)
DOS: 05/07/2022  BCBS Effective 10/17/2021  Hammertoe Repair 2-5 Rt ([id number])  Deductible: $1,500 with $0 met Out-of-Pocket: $6,600 with $6,411 remaining CoInsurance: 30%  Prior authorization not required per Theophilus Bones Call Reference #: [id number]

## 2022-04-19 ENCOUNTER — Encounter: Payer: Self-pay | Admitting: Podiatry

## 2022-04-19 ENCOUNTER — Telehealth: Payer: Self-pay | Admitting: Urology

## 2022-04-19 NOTE — Telephone Encounter (Signed)
Pt is needing to know when she will be released back to work after sx on 05/07/22. Thanks.

## 2022-04-27 DIAGNOSIS — L814 Other melanin hyperpigmentation: Secondary | ICD-10-CM | POA: Diagnosis not present

## 2022-04-27 DIAGNOSIS — L719 Rosacea, unspecified: Secondary | ICD-10-CM | POA: Diagnosis not present

## 2022-04-27 DIAGNOSIS — L821 Other seborrheic keratosis: Secondary | ICD-10-CM | POA: Diagnosis not present

## 2022-05-07 ENCOUNTER — Other Ambulatory Visit: Payer: Self-pay | Admitting: Podiatry

## 2022-05-07 DIAGNOSIS — M24574 Contracture, right foot: Secondary | ICD-10-CM | POA: Diagnosis not present

## 2022-05-07 DIAGNOSIS — M2041 Other hammer toe(s) (acquired), right foot: Secondary | ICD-10-CM | POA: Diagnosis not present

## 2022-05-07 HISTORY — PX: FOOT SURGERY: SHX648

## 2022-05-07 MED ORDER — ACETAMINOPHEN 500 MG PO TABS: 1000.0000 mg | ORAL_TABLET | Freq: Four times a day (QID) | ORAL | 0 refills | Status: AC | PRN

## 2022-05-07 MED ORDER — IBUPROFEN 600 MG PO TABS: 600.0000 mg | ORAL_TABLET | Freq: Four times a day (QID) | ORAL | 0 refills | Status: AC | PRN

## 2022-05-07 MED ORDER — OXYCODONE HCL 5 MG PO TABS: 5.0000 mg | ORAL_TABLET | ORAL | 0 refills | Status: AC | PRN

## 2022-05-07 MED ORDER — GABAPENTIN 300 MG PO CAPS: 300.0000 mg | ORAL_CAPSULE | Freq: Three times a day (TID) | ORAL | 0 refills | Status: DC

## 2022-05-07 NOTE — Progress Notes (Signed)
2/16 right foot hammertoe correction 2/3/4/5

## 2022-05-08 ENCOUNTER — Encounter: Payer: Self-pay | Admitting: Podiatry

## 2022-05-17 ENCOUNTER — Ambulatory Visit (INDEPENDENT_AMBULATORY_CARE_PROVIDER_SITE_OTHER): Payer: BC Managed Care – PPO | Admitting: Podiatry

## 2022-05-17 ENCOUNTER — Ambulatory Visit (INDEPENDENT_AMBULATORY_CARE_PROVIDER_SITE_OTHER): Payer: BC Managed Care – PPO

## 2022-05-17 ENCOUNTER — Encounter: Payer: Self-pay | Admitting: Podiatry

## 2022-05-17 VITALS — BP 114/52 | HR 79

## 2022-05-17 DIAGNOSIS — Z9889 Other specified postprocedural states: Secondary | ICD-10-CM | POA: Diagnosis not present

## 2022-05-17 DIAGNOSIS — M2041 Other hammer toe(s) (acquired), right foot: Secondary | ICD-10-CM

## 2022-05-17 NOTE — Progress Notes (Signed)
  Subjective:  Patient ID: Erica Fox, female    DOB: August 30, 1985,  MRN: [id number]  Chief Complaint  Patient presents with   Routine Post Op    "It's okay."      DOS: 05/07/2022 Procedure: Hammertoe correction 2 through 5 on right foot  37 y.o. female returns for post-op check.  Overall doing okay pain is improving.  Review of Systems: Negative except as noted in the HPI. Denies N/V/F/Ch.   Objective:   Vitals:   05/17/22 1026  BP: (!) 114/52  Pulse: 79   There is no height or weight on file to calculate BMI. Constitutional Well developed. Well nourished.  Vascular Foot warm and well perfused. Capillary refill normal to all digits.  Calf is soft and supple, no posterior calf or knee pain, negative Homans' sign  Neurologic Normal speech. Oriented to person, place, and time. Epicritic sensation to light touch grossly present bilaterally.  Dermatologic Skin healing well without signs of infection. Skin edges well coapted without signs of infection.  Orthopedic: Tenderness to palpation noted about the surgical site.   Multiple view plain film radiographs: Some distal translation approximately 1 cm of the second toe wire Assessment:   1. Hammertoe of right foot    Plan:  Patient was evaluated and treated and all questions answered.  S/p foot surgery right -Progressing as expected post-operatively.  There was some distal translation of the second toe wire.  It was cut short and recapped -XR: Noted above -WB Status: WBAT in CAM boot.  Surgical shoe was dispensed today so that she may begin driving when she is ready to go back to work.  She is able to return to work for seated duties will need to remain in CAM boot. -Sutures: Return in 2 weeks for removal. -Medications: No refills required -Foot redressed.  Return in about 2 weeks (around 05/31/2022) for suture removal, post op (no x-rays).

## 2022-05-31 ENCOUNTER — Ambulatory Visit (INDEPENDENT_AMBULATORY_CARE_PROVIDER_SITE_OTHER): Payer: BC Managed Care – PPO | Admitting: Podiatry

## 2022-05-31 ENCOUNTER — Encounter: Payer: Self-pay | Admitting: Podiatry

## 2022-05-31 VITALS — BP 122/75 | HR 62

## 2022-05-31 DIAGNOSIS — M2041 Other hammer toe(s) (acquired), right foot: Secondary | ICD-10-CM

## 2022-05-31 NOTE — Progress Notes (Signed)
  Subjective:  Patient ID: Erica Fox, female    DOB: 10/14/1985,  MRN: [id number]  Chief Complaint  Patient presents with   Routine Post Op    "It's doing okay."    DOS: 05/07/2022 Procedure: Hammertoe correction 2 through 5 on right foot  37 y.o. female returns for post-op check.  Overall doing okay pain is improving.  Review of Systems: Negative except as noted in the HPI. Denies N/V/F/Ch.   Objective:   Vitals:   05/31/22 1013  BP: 122/75  Pulse: 62   There is no height or weight on file to calculate BMI. Constitutional Well developed. Well nourished.  Vascular Foot warm and well perfused. Capillary refill normal to all digits.  Calf is soft and supple, no posterior calf or knee pain, negative Homans' sign  Neurologic Normal speech. Oriented to person, place, and time. Epicritic sensation to light touch grossly present bilaterally.  Dermatologic Skin healing well without signs of infection. Skin edges well coapted without signs of infection.  Orthopedic: Tenderness to palpation noted about the surgical site.   Multiple view plain film radiographs: Some distal translation approximately 1 cm of the second toe wire Assessment:   1. Hammertoe of right foot    Plan:  Patient was evaluated and treated and all questions answered.  S/p foot surgery right -She is doing well sutures removed uneventfully today.  She may begin bathing and apply Neosporin before and after bathing.  Return in 3 weeks for new radiographs and removal of her Kirschner wires.  Hopefully should be able to transition back to regular shoe gear after that point.  I expect she will not be able to go back to work until 1 week after that visit.  Return in about 3 weeks (around 06/21/2022) for post op (new x-rays), pin removal.

## 2022-06-21 ENCOUNTER — Encounter: Payer: Self-pay | Admitting: Podiatry

## 2022-06-21 ENCOUNTER — Ambulatory Visit (INDEPENDENT_AMBULATORY_CARE_PROVIDER_SITE_OTHER): Payer: BC Managed Care – PPO

## 2022-06-21 ENCOUNTER — Ambulatory Visit (INDEPENDENT_AMBULATORY_CARE_PROVIDER_SITE_OTHER): Payer: BC Managed Care – PPO | Admitting: Podiatry

## 2022-06-21 DIAGNOSIS — M2041 Other hammer toe(s) (acquired), right foot: Secondary | ICD-10-CM

## 2022-06-21 NOTE — Progress Notes (Signed)
  Subjective:  Patient ID: Erica Fox, female    DOB: 09-01-1985,  MRN: [id number]  Chief Complaint  Patient presents with   Routine Post Op    POV #3 DOS 05/07/2022 HAMMERTOE CORRECTION 2-5 RT FOOT    DOS: 05/07/2022 Procedure: Hammertoe correction 2 through 5 on right foot  37 y.o. female returns for post-op check.  She is doing well pain continues to improve  Review of Systems: Negative except as noted in the HPI. Denies N/V/F/Ch.   Objective:   There were no vitals filed for this visit.  There is no height or weight on file to calculate BMI. Constitutional Well developed. Well nourished.  Vascular Foot warm and well perfused. Capillary refill normal to all digits.  Calf is soft and supple, no posterior calf or knee pain, negative Homans' sign  Neurologic Normal speech. Oriented to person, place, and time. Epicritic sensation to light touch grossly present bilaterally.  Dermatologic Incisions well-healed and not hypertrophic  Orthopedic: No pain or tenderness to palpation at surgical site   Multiple view plain film radiographs: Good early consolidation across fusion sites Assessment:   1. Hammertoe of right foot    Plan:  Patient was evaluated and treated and all questions answered.  S/p foot surgery right -Overall doing well, Kirschner wires removed uneventfully and bandages applied.  She may resume regular activity gradually in regular shoes.  She will begin range of motion exercises of the toes.  Okay to return to work next week discussed restrictions that she may have difficulty with stairs which she has had so far, long distances and may need to use boot as needed  Return in 6 weeks (on 08/02/2022) for post op (new x-rays bilateral foot).

## 2022-07-01 ENCOUNTER — Ambulatory Visit: Payer: BC Managed Care – PPO | Admitting: Physician Assistant

## 2022-07-08 ENCOUNTER — Ambulatory Visit: Payer: BC Managed Care – PPO | Admitting: Orthopaedic Surgery

## 2022-07-08 ENCOUNTER — Other Ambulatory Visit (INDEPENDENT_AMBULATORY_CARE_PROVIDER_SITE_OTHER): Payer: BC Managed Care – PPO

## 2022-07-08 ENCOUNTER — Telehealth: Payer: Self-pay

## 2022-07-08 DIAGNOSIS — M25562 Pain in left knee: Secondary | ICD-10-CM | POA: Diagnosis not present

## 2022-07-08 DIAGNOSIS — G8929 Other chronic pain: Secondary | ICD-10-CM

## 2022-07-08 MED ORDER — TRAMADOL HCL 50 MG PO TABS: 50.0000 mg | ORAL_TABLET | Freq: Two times a day (BID) | ORAL | 2 refills | Status: DC | PRN

## 2022-07-08 NOTE — Telephone Encounter (Signed)
Bil Gel approval for patient please

## 2022-07-08 NOTE — Progress Notes (Signed)
Office Visit Note   Patient: Erica Fox           Date of Birth: 1985/11/24           MRN: [id number] Visit Date: 07/08/2022              Requested by: Avanell Shackleton, NP-C [address] [place],  [place] [address] PCP: Avanell Shackleton, NP-C   Assessment & Plan: Visit Diagnoses:  1. Chronic pain of left knee     Plan: Impression is chronic bilateral knee pain with questionable underlying autoimmune disease.  Patient symptoms correlate more with an arthritic process.  We have discussed obtaining approval for viscosupplementation injections to hopefully provide some relief.  She is unable to get into the rheumatologist office until this July so we will put in an referral for a different provider in hopes of getting her in sooner.  I will send in some tramadol to take as needed.  Follow-up as needed.  Follow-Up Instructions: Return if symptoms worsen or fail to improve.   Orders:  Orders Placed This Encounter  Procedures   XR KNEE 3 VIEW LEFT   Meds ordered this encounter  Medications   traMADol (ULTRAM) 50 MG tablet    Sig: Take 1 tablet (50 mg total) by mouth every 12 (twelve) hours as needed.    Dispense:  30 tablet    Refill:  2      Procedures: No procedures performed   Clinical Data: No additional findings.   Subjective: Chief Complaint  Patient presents with   Left Knee - Pain    HPI patient is a pleasant 37 year old female who comes back in for recurrent bilateral knee pain left greater than right.  She was seen in our office back in December with bilateral knee and bilateral hand pain.  She has a history of juvenile RA she was treated with methotrexate for adult RA about 7 years ago in [place].  This did seem to help but unfortunately she neglected continue treatment when she moved here several years ago.  The pain she is having is to the entire aspect of both knees.  Pain is constant but worse going from a seated to standing position as well as with  stair climbing.  She denies mechanical symptoms.  She has been taking ibuprofen and Tylenol without significant relief.  She underwent bilateral knee cortisone injections back in December which provided good but unfortunately temporary relief only lasting about 2 weeks.  She has not undergone viscosupplementation injection in the past.  Review of Systems as detailed in HPI.  All others reviewed and are negative.   Objective: Vital Signs: There were no vitals taken for this visit.  Physical Exam well-developed well-nourished female no acute distress.  Alert and oriented x 3.  Ortho Exam bilateral knee exam is unchanged  Specialty Comments:  No specialty comments available.  Imaging: XR KNEE 3 VIEW LEFT  Result Date: 07/08/2022 X-rays demonstrate decreased joint space medial and patellofemoral compartments    PMFS History: There are no problems to display for this patient.  Past Medical History:  Diagnosis Date   Anemia    Degenerative joint disease     No family history on file.  Past Surgical History:  Procedure Laterality Date   FOOT SURGERY Left    Social History   Occupational History   Not on file  Tobacco Use   Smoking status: Never   Smokeless tobacco: Never  Vaping Use  Vaping Use: Never used  Substance and Sexual Activity   Alcohol use: Yes    Comment: very rarely   Drug use: Not Currently   Sexual activity: Not on file

## 2022-07-08 NOTE — Telephone Encounter (Signed)
VOB submitted for Orthovisc, bilateral knee 

## 2022-07-09 ENCOUNTER — Encounter: Payer: Self-pay | Admitting: Physician Assistant

## 2022-07-21 NOTE — Progress Notes (Unsigned)
Office Visit Note  Patient: Erica Fox             Date of Birth: 02-16-1986           MRN: [id number]             PCP: Avanell Shackleton, NP-C Referring: Ivin Poot Visit Date: 07/22/2022 Occupation: @GUAROCC @  Subjective:  Pain in multiple joints  History of Present Illness: Erica Fox is a 37 y.o. female seen in consultation per request of her PCP.  According to the patient her symptoms started when she was 37 years old with joint pain and swelling.  She was diagnosed with juvenile idiopathic arthritis.  She was under care of her rheumatologist in [place] until she was 37 years old.  She recalls being in a leg brace because her right knee joint was bent for a long time.  She was treated with multiple DMARDs and Biologics per patient.  She states the last medications she took at age 45 were methotrexate and Enbrel.  She discontinued all medications that she moved out of her house.  She continues to have pain and discomfort in her joints.  She states she dealt with pain all of her life.  8 years ago she started seeing a rheumatologist in [place] who did labs and x-rays and told her that she may have a spondyloarthropathy but patient did not want to take any medications that she was planning pregnancy.  In November 2023 she started experiencing increased pain and discomfort in her hands, feet and her knee joints.  She had been seeing Dr. 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  She had right foot surgery in February 2024.  She has a follow-up appointment with Dr. Lilian Kapur.  She was also referred to Dr. Deno Etienne for knee joint discomfort.  Dr. Deno Etienne did x-rays which showed moderate osteoarthritis.  Nothing else was offered.  She continues to have discomfort in her bilateral hands and have lack of sensation in her hands.  She notices swelling in her hands and her left knee.  There is no history of oral ulcers,  nasal ulcers, malar rash, photosensitivity, Raynaud's or lymphadenopathy.  Gravida 3, para 2 miscarriages 1.  She has IUD as a birth control method.  She does not know much about her family.  She was adopted.    Activities of Daily Living:  Patient reports morning stiffness for all day. Patient Reports nocturnal pain.  Difficulty dressing/grooming: Reports Difficulty climbing stairs: Reports Difficulty getting out of chair: Reports Difficulty using hands for taps, buttons, cutlery, and/or writing: Reports  Review of Systems  Constitutional:  Positive for fatigue.  HENT:  Negative for mouth sores and mouth dryness.   Eyes:  Negative for dryness.  Respiratory:  Negative for shortness of breath.   Cardiovascular:  Negative for chest pain and palpitations.  Gastrointestinal:  Negative for blood in stool, constipation and diarrhea.  Endocrine: Negative for increased urination.  Genitourinary:  Negative for involuntary urination.  Musculoskeletal:  Positive for joint pain, gait problem, joint pain, joint swelling and morning stiffness. Negative for myalgias, muscle weakness, muscle tenderness and myalgias.  Skin:  Negative for color change, rash, hair loss and sensitivity to sunlight.  Allergic/Immunologic: Positive for susceptible to infections.  Neurological:  Positive for headaches. Negative for dizziness.  Hematological:  Positive for swollen glands.  Psychiatric/Behavioral:  Positive for sleep disturbance. Negative for depressed mood. The patient is nervous/anxious.  PMFS History:  There are no problems to display for this patient.   Past Medical History:  Diagnosis Date   Anemia    Degenerative joint disease     Family History  Problem Relation Age of Onset   Heart disease Mother    Healthy Son    Healthy Son    Past Surgical History:  Procedure Laterality Date   CESAREAN SECTION     2007, 2012   EYE SURGERY Right    age 83, tighten muscle for lazy eye   FOOT  SURGERY Left    hammertoe correction & screws placed   FOOT SURGERY Right 05/07/2022   hammertoe correction   WISDOM TOOTH EXTRACTION     Social History   Social History Narrative   Not on file    There is no immunization history on file for this patient.   Objective: Vital Signs: BP 122/86 (BP Location: Right Arm, Patient Position: Sitting, Cuff Size: Large)   Pulse 68   Resp 16   Ht 5' 8.75" (1.746 m)   Wt 260 lb 3.2 oz (118 kg)   BMI 38.70 kg/m    Physical Exam Vitals and nursing note reviewed.  Constitutional:      Appearance: She is well-developed.  HENT:     Head: Normocephalic and atraumatic.  Eyes:     Conjunctiva/sclera: Conjunctivae normal.  Cardiovascular:     Rate and Rhythm: Normal rate and regular rhythm.     Heart sounds: Normal heart sounds.  Pulmonary:     Effort: Pulmonary effort is normal.     Breath sounds: Normal breath sounds.  Abdominal:     General: Bowel sounds are normal.     Palpations: Abdomen is soft.  Musculoskeletal:     Cervical back: Normal range of motion.  Lymphadenopathy:     Cervical: No cervical adenopathy.  Skin:    General: Skin is warm and dry.     Capillary Refill: Capillary refill takes less than 2 seconds.  Neurological:     Mental Status: She is alert and oriented to person, place, and time.  Psychiatric:        Behavior: Behavior normal.      Musculoskeletal Exam: She had good range of motion of cervical, thoracic and lumbar spine.  She had no SI joint tenderness.  Shoulder joints, elbow joints were in good range of motion.  She had no flexion or extension in her wrist joints and no synovitis was noted.  Synovial thickening was noted.  She has synovitis over MCPs and PIP joints as described below.  Hip joints and knee joints were in good range of motion.  She had warmth on palpation of bilateral knee joints.  Postsurgical changes were noted over the left ankle joint.  She had surgical scar over bilateral MTP joints  but no swelling was noted.  CDAI Exam: CDAI Score: 15.6  Patient Global: 10 mm; Provider Global: 6 mm Swollen: 7 ; Tender: 7  Joint Exam 07/22/2022      Right  Left  MCP 3  Swollen Tender     PIP 2  Swollen Tender  Swollen Tender  PIP 4  Swollen Tender  Swollen Tender  Knee  Swollen Tender  Swollen Tender     Investigation: No additional findings.  Imaging: XR KNEE 3 VIEW LEFT  Result Date: 07/08/2022 X-rays demonstrate decreased joint space medial and patellofemoral compartments   Recent Labs: Lab Results  Component Value Date   WBC 8.6 03/11/2022  HGB 13.8 03/11/2022   PLT 207.0 03/11/2022   NA 137 03/11/2022   K 4.1 03/11/2022   CL 107 03/11/2022   CO2 23 03/11/2022   GLUCOSE 90 03/11/2022   BUN 13 03/11/2022   CREATININE 0.64 03/11/2022   BILITOT 0.5 03/11/2022   ALKPHOS 66 03/11/2022   AST 14 03/11/2022   ALT 13 03/11/2022   PROT 8.3 03/11/2022   ALBUMIN 4.8 03/11/2022   CALCIUM 10.1 03/11/2022    Speciality Comments: No specialty comments available.  Procedures:  No procedures performed Allergies: Penicillins   Assessment / Plan:     Visit Diagnoses: Rheumatoid arthritis of multiple sites with negative rheumatoid factor (HCC)-patient was diagnosed with juvenile idiopathic arthritis at age 1.  She was on different DMARDs and Biologics until 89.  Patient recalls that she stopped methotrexate and Enbrel at age 67 because she did not want to be on any medications.  She continues to have joint pain and joint swelling over the years.  She had seen a rheumatologist to 8 years ago but did not want to go on the medications that she wanted to get pregnant.  She states the pain and swelling has been worse since November 2023.  She has been having pain and discomfort in her bilateral hands and has difficulty making a fist.  She also has ongoing pain and swelling in her bilateral knee joints.  She had pain and discomfort in her bilateral feet for which she has been  seeing Dr. Lilian Kapur and underwent multiple surgeries.  I reviewed x-rays of her bilateral hands, bilateral knee joints and bilateral feet.  X-rays were consistent with rheumatoid arthritis.  Detailed counsel regarding rheumatoid arthritis was provided.  Different treatment options and their side effects were discussed.  Patient will need aggressive therapy due to severe rheumatoid arthritis.  Patient does not like taking frequent injections.  I discussed the option of Simponi subcu injections.  Patient was in agreement.  Indications side effects contraindications were discussed at length.  A handout was given and consent was taken. Medication counseling:   Baseline Immunosuppressant Therapy Labs TB GOLD pending   Hepatitis Panel-pending   Chest x-ray: March 20, 2021  Does patient have diagnosis of heart failure?  No  Contraception-IUD  Counseled patient that Simponi Jarold Song is a TNF blocking agent.  Reviewed Simponi Aria dose of 2 mg/kg at weeks 0, 4, and then every 8 weeks thereafter.  Counseled patient on purpose, proper use, and adverse effects of Simponi.  Reviewed the most common adverse effects including infections and infusion reactions.  Discussed that there is the possibility of an increased risk of malignancy but it is not well understood if this increased risk is due to the medication or the disease state.  Advised patient to get yearly dermatology exams due to risk of skin cancer.  Reviewed the importance of regular labs while on Simponi therapy.  Counseled patient that Simponi should be held prior to scheduled surgery.  Counseled patient to avoid live vaccines while on Simponi.  Advised patient to get annual influenza vaccine and the pneumococcal vaccine as indicated.  Provided patient with medication education material and answered all questions.  Patient voiced understanding.  Patient consented to Simponi.  Will upload consent into the media tab.  Will submit benefits investigation for  Simponi Aria.     Pain in both hands -patient complains of pain and discomfort in her bilateral hands and significant stiffness.  She had synovitis over her bilateral MCPs and PIPs  as described above.  XR 03/19/22: I personally reviewed the x-rays which showed severe intercarpal and radiocarpal joint space narrowing.  MCP and PIP narrowing was also noted.  These findings are consistent with rheumatoid arthritis.  Her rheumatoid factor was negative and sedimentation rate was elevated.- Plan: Cyclic citrul peptide antibody, IgG, Mutated Citrullinated Vimentin (MCV) Antibody  Chronic pain of both knees -she complains of pain and inflammation in the bilateral knee joints.  She had warmth on palpation of bilateral knee joints.  She has difficulty getting up from the sitting position due to knee joint discomfort.  XR 03/19/22 Moderate OA in medial and patellofemoral compartments.  I reviewed the x-ray results with the patient.  Pain in both feet-she complains of pain and discomfort in her bilateral feet.  MRI of the ankle joint from October 24, 2020 was reviewed which showed early tibiotalar and subtalar joint degenerative changes.  She underwent collation of tibiotalar joint in December 2023.  She also had bilateral feet reconstructive surgery by Dr. Lilian Kapur.  Postsurgical changes were noted.  High risk medication use -will obtain following labs today in anticipation to start her immunosuppressive therapy.  Plan: CBC with Differential/Platelet, COMPLETE METABOLIC PANEL WITH GFR, Hepatitis B core antibody, IgM, Hepatitis B surface antigen, Hepatitis C antibody, QuantiFERON-TB Gold Plus, Serum protein electrophoresis with reflex, IgG, IgA, IgM  Elevated sed rate - 03/11/22: ESR 46, RF-, CRP WNL, ANA negative  Other fatigue - Plan: CK  JIA (juvenile idiopathic arthritis) (HCC)-patient was diagnosed at age 77 and was treated with immunosuppressive therapy.  She discontinued methotrexate and Enbrel at age 60 as  she did not want to take the medications anymore.  Orders: Orders Placed This Encounter  Procedures   CBC with Differential/Platelet   COMPLETE METABOLIC PANEL WITH GFR   CK   Cyclic citrul peptide antibody, IgG   Hepatitis B core antibody, IgM   Hepatitis B surface antigen   Hepatitis C antibody   QuantiFERON-TB Gold Plus   Serum protein electrophoresis with reflex   IgG, IgA, IgM   Mutated Citrullinated Vimentin (MCV) Antibody   No orders of the defined types were placed in this encounter.    Follow-Up Instructions: Return for Rheumatoid arthritis.   Pollyann Savoy, MD  Note - This record has been created using Animal nutritionist.  Chart creation errors have been sought, but may not always  have been located. Such creation errors do not reflect on  the standard of medical care.

## 2022-07-22 ENCOUNTER — Telehealth: Payer: Self-pay | Admitting: Pharmacist

## 2022-07-22 ENCOUNTER — Encounter: Payer: Self-pay | Admitting: Rheumatology

## 2022-07-22 ENCOUNTER — Ambulatory Visit: Payer: Commercial Managed Care - PPO | Attending: Rheumatology | Admitting: Rheumatology

## 2022-07-22 VITALS — BP 122/86 | HR 68 | Resp 16 | Ht 68.75 in | Wt 260.2 lb

## 2022-07-22 DIAGNOSIS — M25562 Pain in left knee: Secondary | ICD-10-CM

## 2022-07-22 DIAGNOSIS — M0880 Other juvenile arthritis, unspecified site: Secondary | ICD-10-CM

## 2022-07-22 DIAGNOSIS — R700 Elevated erythrocyte sedimentation rate: Secondary | ICD-10-CM

## 2022-07-22 DIAGNOSIS — G8929 Other chronic pain: Secondary | ICD-10-CM

## 2022-07-22 DIAGNOSIS — M79672 Pain in left foot: Secondary | ICD-10-CM

## 2022-07-22 DIAGNOSIS — M79671 Pain in right foot: Secondary | ICD-10-CM

## 2022-07-22 DIAGNOSIS — M79642 Pain in left hand: Secondary | ICD-10-CM

## 2022-07-22 DIAGNOSIS — M0609 Rheumatoid arthritis without rheumatoid factor, multiple sites: Secondary | ICD-10-CM

## 2022-07-22 DIAGNOSIS — M79641 Pain in right hand: Secondary | ICD-10-CM

## 2022-07-22 DIAGNOSIS — M25561 Pain in right knee: Secondary | ICD-10-CM | POA: Diagnosis not present

## 2022-07-22 DIAGNOSIS — R5383 Other fatigue: Secondary | ICD-10-CM

## 2022-07-22 DIAGNOSIS — Z79899 Other long term (current) drug therapy: Secondary | ICD-10-CM

## 2022-07-22 NOTE — Telephone Encounter (Signed)
Submitted a Prior Authorization request to [facility] for [facility] via CoverMyMeds. Will update once we receive a response.  Key: [id number]  Chesley Mires, PharmD, MPH, BCPS, CPP Clinical Pharmacist (Rheumatology and Pulmonology)

## 2022-07-22 NOTE — Progress Notes (Signed)
Pharmacy Note  Subjective: Patient presents today to [facility] for follow up office visit.  Patient seen by the pharmacist for counseling on Simponi for rheumatoid arthritis.  Prior therapy includes:Enbrel (less than one year and she does not recall her response to medication), methotrexate (patient choice)  Diagnosis of heart failure: No  Objective:  CBC    Component Value Date/Time   WBC 8.6 03/11/2022 1539   RBC 4.75 03/11/2022 1539   HGB 13.8 03/11/2022 1539   HCT 41.6 03/11/2022 1539   PLT 207.0 03/11/2022 1539   MCV 87.6 03/11/2022 1539   MCH 29.1 03/20/2021 2043   MCHC 33.2 03/11/2022 1539   RDW 13.5 03/11/2022 1539   LYMPHSABS 3.4 03/11/2022 1539   MONOABS 0.5 03/11/2022 1539   EOSABS 0.3 03/11/2022 1539   BASOSABS 0.1 03/11/2022 1539     CMP     Component Value Date/Time   NA 137 03/11/2022 1539   K 4.1 03/11/2022 1539   CL 107 03/11/2022 1539   CO2 23 03/11/2022 1539   GLUCOSE 90 03/11/2022 1539   BUN 13 03/11/2022 1539   CREATININE 0.64 03/11/2022 1539   CALCIUM 10.1 03/11/2022 1539   PROT 8.3 03/11/2022 1539   ALBUMIN 4.8 03/11/2022 1539   AST 14 03/11/2022 1539   ALT 13 03/11/2022 1539   ALKPHOS 66 03/11/2022 1539   BILITOT 0.5 03/11/2022 1539   GFRNONAA >60 03/20/2021 2043     Baseline Immunosuppressant Therapy Labs TB GOLD   Hepatitis Panel   HIV No results found for: "HIV" Immunoglobulins   SPEP    Latest Ref Rng & Units 03/11/2022    3:39 PM  Serum Protein Electrophoresis  Total Protein 6.0 - 8.3 g/dL 8.3    V7QI No results found for: "G6PDH" TPMT No results found for: "TPMT"   Chest x-ray: 03/20/21 - No active disease  Does patient have diagnosis of heart failure?  No  Assessment/Plan: Counseled patient that Simponi Jarold Song is a TNF blocking agent.  Counseled patient on purpose, proper use, and adverse effects of Simponi.  Reviewed the most common adverse effects including infections, headache, and injection site  reactions. Discussed that there is the possibility of an increased risk of malignancy including non-melanoma skin cancer but it is not well understood if this increased risk is due to the medication or the disease state.  Advised patient to get yearly dermatology exams due to risk of skin cancer. Counseled patient that Simponi should be held prior to scheduled surgery.  Counseled patient to avoid live vaccines while on Simponi.  Recommend annual influenza, PCV 15 or PCV20 or Pneumovax 23, and Shingrix as indicated.  Reviewed the importance of regular labs while on Simponi therapy. Will monitor CBC and CMP 1 month after starting and then every 3 months routinely thereafter. Will monitor TB gold annually. Standing orders placed. Provided patient with medication education material and answered all questions. Patient consented to Simponi.  Will upload consent into the media tab. Advised initial injection must be administered in office.  Patient verbalized understanding.  Dermatology referral will be placed at new start visit.  Patient dose will be Simponi 50 mg every 28 days.  Prescription pending lab results and/or insurance approval.  Chesley Mires, PharmD, MPH, BCPS, CPP Clinical Pharmacist (Rheumatology and Pulmonology)

## 2022-07-22 NOTE — Patient Instructions (Signed)
Rheumatoid Arthritis Rheumatoid arthritis (RA) is a long-term (chronic) disease. RA causes inflammation in your joints. Your joints may feel painful, stiff, swollen, and warm. RA may start slowly. It most often affects the small joints of the hands and feet. It can also affect other parts of the body. Symptoms of RA often come and go. There is no cure for RA, but medicines can help your symptoms. What are the causes? RA is an autoimmune disease. This means that your body's defense system (immune system) attacks healthy parts of your body by mistake. The exact cause of RA is not known. What increases the risk? Being female. Having a family history of RA or other diseases like RA. Smoking. Being very overweight (obese). Being exposed to pollutants or chemicals. What are the signs or symptoms? Symptoms start slowly. They are often worse in the morning. The first symptom is often morning stiffness that lasts longer than 30 minutes. As RA gets worse, symptoms may include: Pain, stiffness, swelling, warmth, and tenderness in joints on both sides of your body. Loss of energy. Not wanting to eat as much as normal. Weight loss. A low fever. Dry eyes and a dry mouth. Firm lumps that grow under your skin. Changes in the way your joints look or the way they work. Symptoms vary and they often come and go. Symptoms sometimes get worse for a period of time. These are called flares. How is this treated? Treatment may include: Taking good care of yourself. Be sure to rest as needed, eat a healthy diet, and exercise. Medicines. These may include: Pain relievers. Medicines to help with inflammation. Disease-modifying antirheumatic drugs (DMARDs). Medicines called biologic response modifiers. Physical therapy and occupational therapy. Surgery, if joint damage is very bad. Your doctor will work with you to find the best treatments. Follow these instructions at home: Managing pain, stiffness, and  swelling If told, put heat on the affected area. Do this as often as told by your doctor. Use the heat source that your doctor recommends, such as a moist heat pack or a heating pad. Place a towel between your skin and the heat source. Leave the heat on for 20-30 minutes. Take off the heat if your skin turns bright red. This is very important. If you cannot feel pain, heat, or cold, you have a greater risk of getting burned.  Activity Return to your normal activities when your doctor says that it is safe. Rest when you have a flare. Exercise as told by your doctor. This can help your joints move better and get stronger. General instructions Take over-the-counter and prescription medicines only as told by your doctor. Keep all follow-up visits. Where to find more information American College of Rheumatology: rheumatology.org Arthritis Foundation: arthritis.org Contact a doctor if: You have a flare. You have a fever. You have problems because of your medicines. Get help right away if: You have chest pain. You have trouble breathing. You get a hot, painful joint all of a sudden, and it is worse than your normal joint aches. These symptoms may be an emergency. Get help right away. Call 911. Do not wait to see if the symptoms will go away. Do not drive yourself to the hospital. Summary RA is a long-term disease. RA causes inflammation in your joints. Symptoms of RA start slowly. They are often worse in the morning. This information is not intended to replace advice given to you by your health care provider. Make sure you discuss any questions you have with   your health care provider. Document Revised: 01/08/2021 Document Reviewed: 01/08/2021 Elsevier Patient Education  2023 Elsevier Inc. Golimumab Injection What is this medication? GOLIMUMAB (goe LIM ue mab) treats autoimmune conditions, such as arthritis and ulcerative colitis. It works by slowing down an overactive immune system. It  belongs to a group of medications called TNF inhibitors. It is a monoclonal antibody. This medicine may be used for other purposes; ask your health care provider or pharmacist if you have questions. COMMON BRAND NAME(S): Simponi, SIMPONI ARIA What should I tell my care team before I take this medication? They need to know if you have any of these conditions: Cancer Diabetes Guillain-Barre syndrome Heart failure Hepatitis B or history of hepatitis B infection Immune system problems Infection or history of infections Low blood counts, such as low white cell, platelet, or red cell counts Multiple sclerosis Psoriasis Recently received or scheduled to receive a vaccine Tuberculosis, a positive skin test for tuberculosis, or recent close contact with someone who has tuberculosis An unusual or allergic reaction to golimumab, other medications, latex, rubber, foods, dyes, or preservatives Pregnant or trying to get pregnant Breast-feeding How should I use this medication? This medication is injected into a vein or under the skin. It is usually given by your care team in a hospital or clinic setting. It may also be given at home. If you get this medication at home, you will be taught how to prepare and give it. Use exactly as directed. Take it as directed on the prescription label at the same time every day. Keep taking it unless your care team tells you to stop. It is important that you put your used injectors, needles, and syringes in a special sharps container. Do not put them in a trash can. If you do not have a sharps container, call your pharmacist or care team to get one. A special MedGuide will be given to you by the pharmacist with each prescription and refill. Be sure to read this information carefully each time. If you get this medication in a hospital or clinic setting, a special MedGuide will be given to you before each treatment. Be sure to read this information carefully each time. Talk  to your care team about the use of this medication in children. While it may be prescribed for children as young as 2 years for selected conditions, precautions do apply. Overdosage: If you think you have taken too much of this medicine contact a poison control center or emergency room at once. NOTE: This medicine is only for you. Do not share this medicine with others. What if I miss a dose? If you get this medication at the hospital or clinic: It is important not to miss your dose. Call your care team if you are unable to keep an appointment. If you give yourself this medication at home: If you miss a dose, take it as soon as you can. If it is almost time for your next dose, take only that dose. Do not take double or extra doses. Call your care team with questions. What may interact with this medication? Do not take this medication with any of the following: Abatacept Adalimumab Anakinra Certolizumab Etanercept Infliximab Live virus vaccines Rilonacept Rituximab Tocilizumab This medication may also interact with the following: Cyclosporine Theophylline Vaccines Warfarin This list may not describe all possible interactions. Give your health care provider a list of all the medicines, herbs, non-prescription drugs, or dietary supplements you use. Also tell them if you smoke, drink  alcohol, or use illegal drugs. Some items may interact with your medicine. What should I watch for while using this medication? Visit your care team for regular checks on your progress. Tell your care team if your symptoms do not start to get better or if they get worse. You will be tested for tuberculosis (TB) before you start this medication. If your care team prescribes any medication for TB, you should start taking the TB medication before starting this medication. Make sure to finish the full course of TB medication. This medication may increase your risk of getting an infection. Call you care team if you get  a fever, chills, sore throat, or other symptoms of a cold or flu. Do not treat yourself. Try to avoid being around people who are sick. Talk to your care team about your risk of cancer. You may be more at risk for certain types of cancers if you take this medication. What side effects may I notice from receiving this medication? Side effects that you should report to your care team as soon as possible: Allergic reactions--skin rash, itching, hives, swelling of the face, lips, tongue, or throat Aplastic anemia--unusual weakness or fatigue, dizziness, headache, trouble breathing, increased bleeding or bruising Body pain, tingling, or numbness Heart failure--shortness of breath, swelling of the ankles, feet, or hands, sudden weight gain, unusual weakness or fatigue Infection--fever, chills, cough, sore throat, wounds that don't heal, pain or trouble when passing urine, general feeling of discomfort or being unwell Lupus-like syndrome--joint pain, swelling, or stiffness, butterfly-shaped rash on the face, rashes that get worse in the sun, fever, unusual weakness or fatigue Unusual bruising or bleeding Side effects that usually do not require medical attention (report to your care team if they continue or are bothersome): Dizziness Fever Increase in blood pressure Runny or stuffy nose Sore throat This list may not describe all possible side effects. Call your doctor for medical advice about side effects. You may report side effects to FDA at 1-800-FDA-1088. Where should I keep my medication? Infusions will be given in a hospital or clinic. They will not be stored at home. Storage for syringes or injectors given under the skin and stored at home: Keep out of the reach of children and pets. Refrigeration (preferred): Store in the refrigerator. Do not freeze. Do not shake. Keep this medication in the original container. Protect from light. Get rid of any unused medication after the expiration  date. Room Temperature: This medication may be stored at room temperature between 20 and 25 degrees C (68 and 77 degrees F) for up to 30 days. Do not put the medication back in the refrigerator after it reaches room temperature. Keep this medication in the original container. Protect from light. Do not freeze. Do not shake. If it is stored at room temperature, get rid any unused medication after 30 days or after it expires, whichever comes first. To get rid of medications that are no longer needed or have expired: Take the medication to a medication take-back program. Check with your pharmacy or law enforcement to find a location. If you cannot return the medication, ask your pharmacist or care team how to get rid of this medication safely. NOTE: This sheet is a summary. It may not cover all possible information. If you have questions about this medicine, talk to your doctor, pharmacist, or health care provider.  2023 Elsevier/Gold Standard (2021-05-21 00:00:00) Standing Labs We placed an order today for your standing lab work.   Please have  your standing labs drawn in 1 month after starting Simponi and then every 3 months  Please have your labs drawn 2 weeks prior to your appointment so that the provider can discuss your lab results at your appointment, if possible.  Please note that you may see your imaging and lab results in MyChart before we have reviewed them. We will contact you once all results are reviewed. Please allow our office up to 72 hours to thoroughly review all of the results before contacting the office for clarification of your results.  WALK-IN LAB HOURS  Monday through Thursday from 8:00 am -12:30 pm and 1:00 pm-5:00 pm and Friday from 8:00 am-12:00 pm.  Patients with office visits requiring labs will be seen before walk-in labs.  You may encounter longer than normal wait times. Please allow additional time. Wait times may be shorter on  Monday and Thursday afternoons.  We  do not book appointments for walk-in labs. We appreciate your patience and understanding with our staff.   Labs are drawn by [facility]. Please bring your co-pay at the time of your lab draw.  You may receive a bill from [facility] for your lab work.  Please note if you are on Hydroxychloroquine and and an order has been placed for a Hydroxychloroquine level,  you will need to have it drawn 4 hours or more after your last dose.  If you wish to have your labs drawn at another location, please call the office 24 hours in advance so we can fax the orders.  The office is located at [address], [place], [place] [address]   If you have any questions regarding directions or hours of operation,  please call [phone].   As a reminder, please drink plenty of water prior to coming for your lab work. Thanks!   Vaccines You are taking a medication(s) that can suppress your immune system.  The following immunizations are recommended: Flu annually Covid-19  Td/Tdap (tetanus, diphtheria, pertussis) every 10 years Pneumonia (Prevnar 15 then Pneumovax 23 at least 1 year apart.  Alternatively, can take Prevnar 20 without needing additional dose) Shingrix: 2 doses from 4 weeks to 6 months apart  Please check with your PCP to make sure you are up to date.   If you have signs or symptoms of an infection or start antibiotics: First, call your PCP for workup of your infection. Hold your medication through the infection, until you complete your antibiotics, and until symptoms resolve if you take the following: Injectable medication (Actemra, Benlysta, Cimzia, Cosentyx, Enbrel, Humira, Kevzara, Orencia, Remicade, Simponi, Stelara, Taltz, Tremfya) Methotrexate Leflunomide (Arava) Mycophenolate (Cellcept) Harriette Ohara, Olumiant, or Rinvoq  Please get an annual skin examination to screen for skin cancer while you are on Simponi.  Please use sunscreen and use sun protection.

## 2022-07-22 NOTE — Telephone Encounter (Signed)
Please start Simponi SQ BIV.  Dose: 50mg  SQ every 28 days  Dx: Rheumatoid arthritis (M05.9)  Previously tried therapies: Enbrel Methotrexate  Chesley Mires, PharmD, MPH, BCPS, CPP Clinical Pharmacist (Rheumatology and Pulmonology)

## 2022-07-23 ENCOUNTER — Telehealth: Payer: Self-pay | Admitting: *Deleted

## 2022-07-23 DIAGNOSIS — D802 Selective deficiency of immunoglobulin A [IgA]: Secondary | ICD-10-CM

## 2022-07-23 LAB — HEPATITIS C ANTIBODY: Hepatitis C Ab: NONREACTIVE

## 2022-07-23 NOTE — Telephone Encounter (Signed)
-----   Message from Pollyann Savoy, MD sent at 07/23/2022  8:16 AM EDT ----- Labs show severe IgA deficiency.  IgG and IgM are elevated.  Please refer patient to immunology as soon as possible as we may have to start her on immunosuppressive therapy.  Please notify patient that we will refer her to immunology.

## 2022-07-23 NOTE — Progress Notes (Signed)
Labs show severe IgA deficiency.  IgG and IgM are elevated.  Please refer patient to immunology as soon as possible as we may have to start her on immunosuppressive therapy.  Please notify patient that we will refer her to immunology.

## 2022-07-24 LAB — QUANTIFERON-TB GOLD PLUS
Mitogen-NIL: >10 IU/mL
NIL: 0.03 IU/mL

## 2022-07-24 LAB — IGG, IGA, IGM: IgG (Immunoglobin G), Serum: 1839 mg/dL — ABNORMAL HIGH (ref 600–1640)

## 2022-07-25 LAB — IGG, IGA, IGM
IgM, Serum: 508 mg/dL — ABNORMAL HIGH (ref 50–300)
Immunoglobulin A: <5 mg/dL — ABNORMAL LOW (ref 47–310)

## 2022-07-25 LAB — QUANTIFERON-TB GOLD PLUS: TB1-NIL: 0.01 IU/mL

## 2022-07-25 LAB — HEPATITIS B SURFACE ANTIGEN: Hepatitis B Surface Ag: NONREACTIVE

## 2022-07-26 ENCOUNTER — Other Ambulatory Visit: Payer: Self-pay

## 2022-07-26 ENCOUNTER — Ambulatory Visit: Payer: BC Managed Care – PPO | Admitting: Internal Medicine

## 2022-07-26 ENCOUNTER — Encounter: Payer: Self-pay | Admitting: Internal Medicine

## 2022-07-26 VITALS — BP 118/88 | HR 89 | Temp 98.3°F | Resp 20 | Ht 68.0 in | Wt 262.0 lb

## 2022-07-26 DIAGNOSIS — J343 Hypertrophy of nasal turbinates: Secondary | ICD-10-CM | POA: Diagnosis not present

## 2022-07-26 DIAGNOSIS — D802 Selective deficiency of immunoglobulin A [IgA]: Secondary | ICD-10-CM

## 2022-07-26 DIAGNOSIS — J3089 Other allergic rhinitis: Secondary | ICD-10-CM | POA: Diagnosis not present

## 2022-07-26 LAB — CBC WITH DIFFERENTIAL/PLATELET
Absolute Monocytes: 549 cells/uL (ref 200–950)
Basophils Absolute: 49 cells/uL (ref 0–200)
Basophils Relative: 0.8 %
Eosinophils Absolute: 128 cells/uL (ref 15–500)
Eosinophils Relative: 2.1 %
HCT: 44 % (ref 35.0–45.0)
Hemoglobin: 14.5 g/dL (ref 11.7–15.5)
Lymphs Abs: 1854 cells/uL (ref 850–3900)
MCH: 28.8 pg (ref 27.0–33.0)
MCHC: 33 g/dL (ref 32.0–36.0)
MCV: 87.3 fL (ref 80.0–100.0)
MPV: 10.4 fL (ref 7.5–12.5)
Monocytes Relative: 9 %
Neutro Abs: 3520 cells/uL (ref 1500–7800)
Neutrophils Relative %: 57.7 %
Platelets: 210 10*3/uL (ref 140–400)
RBC: 5.04 10*6/uL (ref 3.80–5.10)
RDW: 12.3 % (ref 11.0–15.0)
Total Lymphocyte: 30.4 %
WBC: 6.1 10*3/uL (ref 3.8–10.8)

## 2022-07-26 LAB — PROTEIN ELECTROPHORESIS, SERUM, WITH REFLEX
Albumin ELP: 4.5 g/dL (ref 3.8–4.8)
Alpha 1: 0.4 g/dL — ABNORMAL HIGH (ref 0.2–0.3)
Alpha 2: 0.8 g/dL (ref 0.5–0.9)
Beta 2: 0.3 g/dL (ref 0.2–0.5)
Beta Globulin: 0.5 g/dL (ref 0.4–0.6)
Gamma Globulin: 1.9 g/dL — ABNORMAL HIGH (ref 0.8–1.7)
Total Protein: 8.4 g/dL — ABNORMAL HIGH (ref 6.1–8.1)

## 2022-07-26 LAB — COMPLETE METABOLIC PANEL WITH GFR
AG Ratio: 1.3 (calc) (ref 1.0–2.5)
ALT: 17 U/L (ref 6–29)
AST: 18 U/L (ref 10–30)
Albumin: 4.7 g/dL (ref 3.6–5.1)
Alkaline phosphatase (APISO): 78 U/L (ref 31–125)
BUN: 12 mg/dL (ref 7–25)
CO2: 16 mmol/L — ABNORMAL LOW (ref 20–32)
Calcium: 9.3 mg/dL (ref 8.6–10.2)
Chloride: 105 mmol/L (ref 98–110)
Creat: 0.83 mg/dL (ref 0.50–0.97)
Globulin: 3.6 g/dL (calc) (ref 1.9–3.7)
Glucose, Bld: 98 mg/dL (ref 65–99)
Potassium: 3.9 mmol/L (ref 3.5–5.3)
Sodium: 136 mmol/L (ref 135–146)
Total Bilirubin: 0.3 mg/dL (ref 0.2–1.2)
Total Protein: 8.3 g/dL — ABNORMAL HIGH (ref 6.1–8.1)
eGFR: 92 mL/min/{1.73_m2} (ref >=60)

## 2022-07-26 LAB — QUANTIFERON-TB GOLD PLUS
QuantiFERON-TB Gold Plus: NEGATIVE
TB2-NIL: 0.05 IU/mL

## 2022-07-26 LAB — CK: Total CK: 25 U/L — ABNORMAL LOW (ref 29–143)

## 2022-07-26 LAB — MUTATED CITRULLINATED VIMENTIN (MCV) ANTIBODY: MUTATED CITRULLINATED VIMENTIN (MCV) AB: <20 U/mL (ref <20)

## 2022-07-26 LAB — HEPATITIS B CORE ANTIBODY, IGM: Hep B C IgM: NONREACTIVE

## 2022-07-26 LAB — CYCLIC CITRUL PEPTIDE ANTIBODY, IGG: Cyclic Citrullin Peptide Ab: <16 UNITS

## 2022-07-26 MED ORDER — AZELASTINE HCL 0.1 % NA SOLN: 1.0000 | Freq: Two times a day (BID) | NASAL | 5 refills | Status: DC | PRN

## 2022-07-26 MED ORDER — CETIRIZINE HCL 10 MG PO TABS
10.0000 mg | ORAL_TABLET | Freq: Every day | ORAL | 5 refills | Status: DC
Start: 2022-07-26 — End: 2023-03-30

## 2022-07-26 MED ORDER — FLUTICASONE PROPIONATE 50 MCG/ACT NA SUSP: 2.0000 | Freq: Every day | NASAL | 5 refills | Status: DC

## 2022-07-26 NOTE — Patient Instructions (Addendum)
IgA Deficiency - No history of frequent or severe infections, normal IgM and IgG. No need for further testing prior to starting immunosuppressant, recommend following standard precautions to monitor and avoid infections in the future.   - IgA deficiency is commonly seen in the general population and generally is not associated with increased risk of bacterial infections but can be associated with autoimmunity.  - Did discuss that if she were to need blood products, please let the provider know you are IgA deficient as unwashed blood products can result in an allergic reaction.    Chronic Rhinitis: - Positive skin test 07/2022: none - Use nasal saline rinses before nose sprays such as with Neilmed Sinus Rinse.  Use distilled water.   - Use Azelastine 1-2 sprays each nostril twice daily as needed for runny nose, sneezing, congestion, drainage. Aim upward and outward. - If no improvement, use Flonase 2 sprays each nostril daily. Aim upward and outward. - Use Zyrtec 10 mg daily as needed for runny nose, sneezing, itchy watery eyes.

## 2022-07-26 NOTE — Progress Notes (Signed)
NEW PATIENT  Date of Service/Encounter:  07/26/22  Consult requested by: Avanell Shackleton, NP-C   Subjective:   Erica Fox (DOB: [birth year]) is a 37 y.o. female who presents to the clinic on 07/26/2022 with a chief complaint of Immunotherapy (Rheumatologist wants her om immunotherapy for IGA deficiency.) .    History obtained from: chart review and patient.   Rhinitis:  Started years ago but worse since moving to [place] from [place] the past 2 years. Symptoms include: nasal congestion, rhinorrhea, and post nasal drainage  Occurs seasonally-Spring Potential triggers: pollen  Treatments tried:  Oral anti histamines PRN  Previous allergy testing: no History of sinus surgery: no Nonallergic triggers: none    Recurrent Infections:  Around age 50, for about 1 week was hospitalized and was undergoing workup for possible lymphoma but was negative after multiple biopsy/bone marrow.  No GI infections/frequent sinus infections/pneumonia. Never required IV antibiotics.     Past Medical History: Past Medical History:  Diagnosis Date   Anemia    Degenerative joint disease    Past Surgical History: Past Surgical History:  Procedure Laterality Date   CESAREAN SECTION     2007, 2012   EYE SURGERY Right    age 55, tighten muscle for lazy eye   FOOT SURGERY Left    hammertoe correction & screws placed   FOOT SURGERY Right 05/07/2022   hammertoe correction   WISDOM TOOTH EXTRACTION  2014    Family History: Family History  Problem Relation Age of Onset   Heart disease Mother    Breast cancer Paternal Aunt    Healthy Son    Healthy Son     Social History:  Lives in a 62 year house Flooring in bedroom: wood Pets: Medical laboratory scientific officer and dog Tobacco use/exposure: none Job: International aid/development worker   Medication List:  Allergies as of 07/26/2022       Reactions   Penicillins Other (See Comments)   Adopted mother informed her of allergy but unaware of reaction.        Medication List         Accurate as of Jul 26, 2022  3:07 PM. If you have any questions, ask your nurse or doctor.          traMADol 50 MG tablet Commonly known as: ULTRAM Take 1 tablet (50 mg total) by mouth every 12 (twelve) hours as needed.         REVIEW OF SYSTEMS: Pertinent positives and negatives discussed in HPI.   Objective:   Physical Exam: BP 118/88   Pulse 89   Temp 98.3 F (36.8 C) (Temporal)   Resp 20   Ht 5\' 8"  (1.727 m)   Wt 262 lb (118.8 kg)   SpO2 98%   BMI 39.84 kg/m  Body mass index is 39.84 kg/m. GEN: alert, well developed HEENT: clear conjunctiva, TM grey and translucent, nose with + inferior turbinate hypertrophy, pink nasal mucosa, slight clear rhinorrhea, no cobblestoning HEART: regular rate and rhythm, no murmur LUNGS: clear to auscultation bilaterally, no coughing, unlabored respiration ABDOMEN: soft, non distended  SKIN: no rashes or lesions  Reviewed:  07/22/2022: seen by Dr. [facility] for RF negative RA and hx of JIA.  Planning to start Simponi.  Labs showed IgA deficiency, referred to Allergy/Imm.   07/08/2022: seen by Ortho for chronic bl knee pain with concern for autoimmune disease.  Discussed viscosupplementation injections.  Also sending PRN tramadol.   04/27/2022: [facility] for seborrheic keratosis,  rosacea and lentigo.  Discussed Cosmetic LN2, avoid triggers and SPF daily.   Skin Testing:  Skin prick testing was placed, which includes aeroallergens/foods, histamine control, and saline control.  Verbal consent was obtained prior to placing test.  Patient tolerated procedure well.  Allergy testing results were read and interpreted by myself, documented by clinical staff. Adequate positive and negative control.  Results discussed with patient/family.  Airborne Adult Perc - 07/26/22 1441     Time Antigen Placed 1441    Allergen Manufacturer Waynette Buttery    Location Back    Number of Test 59    1. Control-Buffer  50% Glycerol Negative    2. Control-Histamine 1 mg/ml 3+    3. Albumin saline Negative    4. Bahia Negative    5. French Southern Territories Negative    6. Johnson Negative    7. [place] Blue Negative    8. Meadow Fescue Negative    9. Perennial Rye Negative    10. Sweet Vernal Negative    11. Timothy Negative    12. Cocklebur Negative    13. Burweed Marshelder Negative    14. Ragweed, short Negative    15. Ragweed, Giant Negative    16. Plantain,  English Negative    17. Lamb's Quarters Negative    18. Sheep Sorrell Negative    19. Rough Pigweed Negative    20. Marsh Elder, Rough Negative    21. Mugwort, Common Negative    22. Ash mix Negative    23. Birch mix Negative    24. Beech American Negative    25. Box, Elder Negative    26. Cedar, red Negative    27. Cottonwood, Guinea-Bissau Negative    28. Elm mix Negative    29. Hickory Negative    30. Maple mix Negative    31. Oak, Guinea-Bissau mix Negative    32. Pecan Pollen Negative    33. Pine mix Negative    34. Sycamore Eastern Negative    35. Walnut, Black Pollen Negative    36. Alternaria alternata Negative    37. Cladosporium Herbarum Negative    38. Aspergillus mix Negative    39. Penicillium mix Negative    40. Bipolaris sorokiniana (Helminthosporium) Negative    41. Drechslera spicifera (Curvularia) Negative    42. Mucor plumbeus Negative    43. Fusarium moniliforme Negative    44. Aureobasidium pullulans (pullulara) Negative    45. Rhizopus oryzae Negative    46. Botrytis cinera Negative    47. Epicoccum nigrum Negative    48. Phoma betae Negative    49. Candida Albicans Negative    50. Trichophyton mentagrophytes Negative    51. Mite, D Farinae  5,000 AU/ml Negative    52. Mite, D Pteronyssinus  5,000 AU/ml Negative    53. Cat Hair 10,000 BAU/ml Negative    54.  Dog Epithelia Negative    55. Mixed Feathers Negative    56. Horse Epithelia Negative    57. Cockroach, German Negative    58. Mouse Negative    59. Tobacco Leaf  Negative               Assessment:   1. IgA deficiency (HCC)   2. Nasal turbinate hypertrophy   3. Other allergic rhinitis     Plan/Recommendations:  IgA Deficiency - No history of frequent or severe infections, normal IgM and IgG. No need for further testing prior to starting immunosuppressant, recommend following standard precautions to monitor and avoid infections  in the future.   - IgA deficiency is commonly seen in the general population and generally is not associated with increased risk of bacterial infections but can be associated with autoimmunity.  - Did discuss that if she were to need blood products, please let the provider know you are IgA deficient as unwashed blood products can result in an allergic reaction.    Chronic Rhinitis: - Due to turbinate hypertrophy, seasonal symptoms and unresponsive to OTC meds, performed skin testing to identify aeroallergen triggers.   - Positive skin test 07/2022: none - Use nasal saline rinses before nose sprays such as with Neilmed Sinus Rinse.  Use distilled water.   - Use Azelastine 1-2 sprays each nostril twice daily as needed for runny nose, sneezing, congestion, drainage. Aim upward and outward. - If no improvement, use Flonase 2 sprays each nostril daily. Aim upward and outward. - Use Zyrtec 10 mg daily as needed for runny nose, sneezing, itchy watery eyes.    Return in about 3 months (around 10/26/2022).  Alesia Morin, MD [facility]

## 2022-07-26 NOTE — Progress Notes (Signed)
CMP normal, CK normal, SPEP normal, CBC normal, anti-CCP negative, MCV negative, hepatitis B-, hepatitis C negative, TB Gold negative

## 2022-07-27 NOTE — Telephone Encounter (Signed)
PA for Simponi SQ has been denied by [employer]:

## 2022-08-02 ENCOUNTER — Ambulatory Visit (INDEPENDENT_AMBULATORY_CARE_PROVIDER_SITE_OTHER): Payer: Commercial Managed Care - PPO

## 2022-08-02 ENCOUNTER — Ambulatory Visit (INDEPENDENT_AMBULATORY_CARE_PROVIDER_SITE_OTHER): Payer: BC Managed Care – PPO | Admitting: Podiatry

## 2022-08-02 DIAGNOSIS — M2041 Other hammer toe(s) (acquired), right foot: Secondary | ICD-10-CM

## 2022-08-02 DIAGNOSIS — M19072 Primary osteoarthritis, left ankle and foot: Secondary | ICD-10-CM

## 2022-08-02 NOTE — Patient Instructions (Signed)
Call [facility] to schedule your CT at the below locations.  Please allow at least 1 business day after your visit to process the referral.  It may take longer depending on approval from insurance.  Please let me know if you have issues or problems scheduling the CT   [facility] [phone] [address] [place], [place] [address]    

## 2022-08-03 NOTE — Progress Notes (Signed)
  Subjective:  Patient ID: Erica Fox, female    DOB: [birth year],  MRN: [id number]  Chief Complaint  Patient presents with   Routine Post Op    Right foot with new xrays    DOS: 05/07/2022 Procedure: Hammertoe correction 2 through 5 on right foot  37 y.o. female returns for post-op check.  She is doing well no pain on the toes on the right foot.  The left foot continues to be painful.  Review of Systems: Negative except as noted in the HPI. Denies N/V/F/Ch.   Objective:   There were no vitals filed for this visit.  There is no height or weight on file to calculate BMI. Constitutional Well developed. Well nourished.  Vascular Foot warm and well perfused. Capillary refill normal to all digits.  Calf is soft and supple, no posterior calf or knee pain, negative Homans' sign  Neurologic Normal speech. Oriented to person, place, and time. Epicritic sensation to light touch grossly present bilaterally.  Dermatologic Incisions well-healed and not hypertrophic  Orthopedic: No pain or tenderness to palpation at surgical site on right foot.  Good early range of motion of MPJ.  She does have pain and tenderness around the midfoot on the left centered about the sinus tarsi   Multiple view plain film radiographs: Good early consolidation across fusion sites Assessment:   1. Hammertoe of right foot   2. DJD (degenerative joint disease), ankle and foot, left    Plan:  Patient was evaluated and treated and all questions answered.  S/p foot surgery right -Regarding her hammertoe surgery she may continue regular shoe gear and activity.  I have no further restrictions for her.  She should continue regular range of motion of the digits.  Left foot arthritis Regarding her left foot she is now over 18 months out from her subtalar joint arthrodesis for talocalcaneal coalition.  She is still having persistent pain in the sinus tarsi.  She was concerned that this was near the screw sites.  These are  buried in bone and are remote to this area.  Is possible she is developing secondary arthrosis of the talonavicular calcaneocuboid joint.  Previous injection of the sinus tarsi has not helped.  At this point I would recommend a CT scan to further evaluate in better detail the position of the screws as well as the top navicular and calcaneocuboid joints.  This has been ordered and I will see her back after the CT scan for further options.  Return for after CT to review.

## 2022-08-04 ENCOUNTER — Encounter: Payer: Self-pay | Admitting: Podiatry

## 2022-08-04 ENCOUNTER — Telehealth: Payer: Self-pay | Admitting: Podiatry

## 2022-08-04 NOTE — Telephone Encounter (Signed)
  Pt called again to see if note was done and I verified in last office note ok to return to work as of today with no restrictions.   I typed note and faxed to number attached. Notified pt letter was faxed.

## 2022-08-04 NOTE — Telephone Encounter (Signed)
Pt requesting a work not so she can return back to work today.    Please advise   [phone]- fax

## 2022-08-10 ENCOUNTER — Telehealth: Payer: Self-pay

## 2022-08-10 NOTE — Telephone Encounter (Signed)
Patient contacted the office stating she was returning a call to the pharmacist. Patient call back number is [phone]. Please advise.

## 2022-08-10 NOTE — Telephone Encounter (Signed)
ATC patient regarding Simponi denial.  Patient can try Humira with q 2 weeks dosing if open to it.  Has tried MTX in the past - discontinued about 20 years ago  Erica Fox, PharmD, MPH, BCPS, CPP Clinical Pharmacist (Rheumatology and Pulmonology)

## 2022-08-11 NOTE — Telephone Encounter (Signed)
Submitted a Prior Authorization request to  [employer]  for [facility] via CoverMyMeds. Will update once we receive a response.  Key: [id number]

## 2022-08-11 NOTE — Telephone Encounter (Signed)
Returned call to patient - she has had change in insurance - is now under her husband's plan. Uploaded into media tab on 08/08/22. Will run Simponi PA through this plan

## 2022-08-11 NOTE — Telephone Encounter (Signed)
Returned call to patient - she has had change in insurance - is now under her husband's plan. Uploaded into media tab on 08/08/22  Please run Simponi SQ PA through new plan

## 2022-08-12 NOTE — Progress Notes (Deleted)
Office Visit Note  Patient: Erica Fox             Date of Birth: Apr 21, 1985           MRN: [id number]             PCP: Avanell Shackleton, NP-C Referring: Avanell Shackleton, NP-C Visit Date: 08/25/2022 Occupation: @GUAROCC @  Subjective:  No chief complaint on file.   History of Present Illness: Erica Fox is a 37 y.o. female ***     Activities of Daily Living:  Patient reports morning stiffness for *** {minute/hour:19697}.   Patient {ACTIONS;DENIES/REPORTS:21021675::"Denies"} nocturnal pain.  Difficulty dressing/grooming: {ACTIONS;DENIES/REPORTS:21021675::"Denies"} Difficulty climbing stairs: {ACTIONS;DENIES/REPORTS:21021675::"Denies"} Difficulty getting out of chair: {ACTIONS;DENIES/REPORTS:21021675::"Denies"} Difficulty using hands for taps, buttons, cutlery, and/or writing: {ACTIONS;DENIES/REPORTS:21021675::"Denies"}  No Rheumatology ROS completed.   PMFS History:  Patient Active Problem List   Diagnosis Date Noted   Rheumatoid arthritis of multiple sites with negative rheumatoid factor (HCC) 07/22/2022    Past Medical History:  Diagnosis Date   Anemia    Degenerative joint disease     Family History  Problem Relation Age of Onset   Heart disease Mother    Breast cancer Paternal Aunt    Healthy Son    Healthy Son    Past Surgical History:  Procedure Laterality Date   CESAREAN SECTION     2007, 2012   EYE SURGERY Right    age 62, tighten muscle for lazy eye   FOOT SURGERY Left    hammertoe correction & screws placed   FOOT SURGERY Right 05/07/2022   hammertoe correction   WISDOM TOOTH EXTRACTION  2014   Social History   Social History Narrative   Not on file    There is no immunization history on file for this patient.   Objective: Vital Signs: There were no vitals taken for this visit.   Physical Exam   Musculoskeletal Exam: ***  CDAI Exam: CDAI Score: -- Patient Global: --; Provider Global: -- Swollen: --; Tender: -- Joint Exam  08/25/2022   No joint exam has been documented for this visit   There is currently no information documented on the homunculus. Go to the Rheumatology activity and complete the homunculus joint exam.  Investigation: No additional findings.  Imaging: No results found.  Recent Labs: Lab Results  Component Value Date   WBC 6.1 07/22/2022   HGB 14.5 07/22/2022   PLT 210 07/22/2022   NA 136 07/22/2022   K 3.9 07/22/2022   CL 105 07/22/2022   CO2 16 (L) 07/22/2022   GLUCOSE 98 07/22/2022   BUN 12 07/22/2022   CREATININE 0.83 07/22/2022   BILITOT 0.3 07/22/2022   ALKPHOS 66 03/11/2022   AST 18 07/22/2022   ALT 17 07/22/2022   PROT 8.3 (H) 07/22/2022   PROT 8.4 (H) 07/22/2022   ALBUMIN 4.8 03/11/2022   CALCIUM 9.3 07/22/2022   QFTBGOLDPLUS NEGATIVE 07/22/2022   Jul 22, 2022 SPEP normal, TB Gold negative, hepatitis B negative, hepatitis C negative, MCV negative, IgG and IgM elevated, IgA<5, CK25, anti-CCP negative Speciality Comments: No specialty comments available.  Procedures:  No procedures performed Allergies: Penicillins   Assessment / Plan:     Visit Diagnoses: No diagnosis found.  Orders: No orders of the defined types were placed in this encounter.  No orders of the defined types were placed in this encounter.   Face-to-face time spent with patient was *** minutes. Greater than 50% of time was spent in counseling and coordination of  care.  Follow-Up Instructions: No follow-ups on file.   Pollyann Savoy, MD  Note - This record has been created using Animal nutritionist.  Chart creation errors have been sought, but may not always  have been located. Such creation errors do not reflect on  the standard of medical care.

## 2022-08-25 ENCOUNTER — Telehealth: Payer: Self-pay | Admitting: Rheumatology

## 2022-08-25 ENCOUNTER — Ambulatory Visit: Payer: BC Managed Care – PPO | Admitting: Rheumatology

## 2022-08-25 DIAGNOSIS — M79671 Pain in right foot: Secondary | ICD-10-CM

## 2022-08-25 DIAGNOSIS — D802 Selective deficiency of immunoglobulin A [IgA]: Secondary | ICD-10-CM

## 2022-08-25 DIAGNOSIS — M0609 Rheumatoid arthritis without rheumatoid factor, multiple sites: Secondary | ICD-10-CM

## 2022-08-25 DIAGNOSIS — M0880 Other juvenile arthritis, unspecified site: Secondary | ICD-10-CM

## 2022-08-25 DIAGNOSIS — M170 Bilateral primary osteoarthritis of knee: Secondary | ICD-10-CM

## 2022-08-25 DIAGNOSIS — Z79899 Other long term (current) drug therapy: Secondary | ICD-10-CM

## 2022-08-25 DIAGNOSIS — M79641 Pain in right hand: Secondary | ICD-10-CM

## 2022-08-25 NOTE — Telephone Encounter (Signed)
Patient left a Mychart message requesting an appointment ASAP.  Patient states her left knee is swollen and is having difficulty bending and walking.  Patient states she has been icing and elevating her knee, but "can't take the pain anymore and wants to know if it can be drained?"  Please advise.

## 2022-08-26 ENCOUNTER — Encounter: Payer: Self-pay | Admitting: Rheumatology

## 2022-08-26 ENCOUNTER — Telehealth: Payer: Self-pay | Admitting: Pharmacist

## 2022-08-26 ENCOUNTER — Ambulatory Visit: Payer: Commercial Managed Care - PPO | Attending: Rheumatology | Admitting: Rheumatology

## 2022-08-26 VITALS — BP 140/83 | HR 72 | Resp 16 | Ht 68.0 in | Wt 269.6 lb

## 2022-08-26 DIAGNOSIS — M0609 Rheumatoid arthritis without rheumatoid factor, multiple sites: Secondary | ICD-10-CM | POA: Diagnosis not present

## 2022-08-26 DIAGNOSIS — G8929 Other chronic pain: Secondary | ICD-10-CM

## 2022-08-26 DIAGNOSIS — M25462 Effusion, left knee: Secondary | ICD-10-CM | POA: Diagnosis not present

## 2022-08-26 DIAGNOSIS — M25562 Pain in left knee: Secondary | ICD-10-CM

## 2022-08-26 DIAGNOSIS — M25561 Pain in right knee: Secondary | ICD-10-CM | POA: Diagnosis not present

## 2022-08-26 DIAGNOSIS — M0880 Other juvenile arthritis, unspecified site: Secondary | ICD-10-CM

## 2022-08-26 DIAGNOSIS — Z79899 Other long term (current) drug therapy: Secondary | ICD-10-CM

## 2022-08-26 DIAGNOSIS — M79671 Pain in right foot: Secondary | ICD-10-CM

## 2022-08-26 DIAGNOSIS — M79672 Pain in left foot: Secondary | ICD-10-CM

## 2022-08-26 DIAGNOSIS — M79642 Pain in left hand: Secondary | ICD-10-CM

## 2022-08-26 DIAGNOSIS — M79641 Pain in right hand: Secondary | ICD-10-CM | POA: Diagnosis not present

## 2022-08-26 DIAGNOSIS — J302 Other seasonal allergic rhinitis: Secondary | ICD-10-CM

## 2022-08-26 MED ORDER — LIDOCAINE HCL 1 % IJ SOLN
3.0000 mL | INTRAMUSCULAR | Status: AC | PRN
Start: 2022-08-26 — End: 2022-08-26
  Administered 2022-08-26: 3 mL

## 2022-08-26 MED ORDER — METHOTREXATE SODIUM 2.5 MG PO TABS
15.0000 mg | ORAL_TABLET | ORAL | 0 refills | Status: DC
Start: 2022-08-26 — End: 2022-09-10

## 2022-08-26 MED ORDER — TRIAMCINOLONE ACETONIDE 40 MG/ML IJ SUSP
60.0000 mg | INTRAMUSCULAR | Status: AC | PRN
Start: 2022-08-26 — End: 2022-08-26
  Administered 2022-08-26: 60 mg via INTRA_ARTICULAR

## 2022-08-26 MED ORDER — PREDNISONE 10 MG PO TABS
ORAL_TABLET | ORAL | 0 refills | Status: DC
Start: 2022-08-26 — End: 2022-10-05

## 2022-08-26 MED ORDER — FOLIC ACID 1 MG PO TABS
2.0000 mg | ORAL_TABLET | Freq: Every day | ORAL | 3 refills | Status: DC
Start: 2022-08-26 — End: 2023-09-26

## 2022-08-26 NOTE — Telephone Encounter (Signed)
Submitted an URGENT appeal to  [facility]  for [facility].  Reference # [id number] Phone: [phone] Fax: [phone]  Chesley Mires, PharmD, MPH, BCPS, CPP Clinical Pharmacist (Rheumatology and Pulmonology)

## 2022-08-26 NOTE — Telephone Encounter (Signed)
Received a fax regarding Prior Authorization from [facility] for [facility]. Authorization has been DENIED because preferred options are Humira, Enbrel, and Rinvoq.  Patient has previously taken Enbrel and Humira. Will move forward with appea  Chesley Mires, PharmD, MPH, BCPS, CPP Clinical Pharmacist (Rheumatology and Pulmonology)

## 2022-08-26 NOTE — Progress Notes (Signed)
Pharmacy Note  Subjective: Patient presents today to [facility] for follow up office visit. Patient seen by the pharmacist for counseling on methotrexate for rheumatoid arthritis. Prior therapy includes: Enbrel, oral MTX, and Humira (6 months)  Objective: CBC    Component Value Date/Time   WBC 6.1 07/22/2022 0953   RBC 5.04 07/22/2022 0953   HGB 14.5 07/22/2022 0953   HCT 44.0 07/22/2022 0953   PLT 210 07/22/2022 0953   MCV 87.3 07/22/2022 0953   MCH 28.8 07/22/2022 0953   MCHC 33.0 07/22/2022 0953   RDW 12.3 07/22/2022 0953   LYMPHSABS 1,854 07/22/2022 0953   MONOABS 0.5 03/11/2022 1539   EOSABS 128 07/22/2022 0953   BASOSABS 49 07/22/2022 0953    CMP     Component Value Date/Time   NA 136 07/22/2022 0953   K 3.9 07/22/2022 0953   CL 105 07/22/2022 0953   CO2 16 (L) 07/22/2022 0953   GLUCOSE 98 07/22/2022 0953   BUN 12 07/22/2022 0953   CREATININE 0.83 07/22/2022 0953   CALCIUM 9.3 07/22/2022 0953   PROT 8.3 (H) 07/22/2022 0953   PROT 8.4 (H) 07/22/2022 0953   ALBUMIN 4.8 03/11/2022 1539   AST 18 07/22/2022 0953   ALT 17 07/22/2022 0953   ALKPHOS 66 03/11/2022 1539   BILITOT 0.3 07/22/2022 0953   GFRNONAA >60 03/20/2021 2043    Baseline Immunosuppressant Therapy Labs TB GOLD    Latest Ref Rng & Units 07/22/2022    9:53 AM  Quantiferon TB Gold  Quantiferon TB Gold Plus NEGATIVE NEGATIVE    Hepatitis Panel    Latest Ref Rng & Units 07/22/2022    9:53 AM  Hepatitis  Hep B Surface Ag NON-REACTIVE NON-REACTIVE   Hep B IgM NON-REACTIVE NON-REACTIVE   Hep C Ab NON-REACTIVE NON-REACTIVE    HIV No results found for: "HIV" Immunoglobulins    Latest Ref Rng & Units 07/22/2022    9:53 AM  Immunoglobulin Electrophoresis  IgA  47 - 310 mg/dL <5   IgG 098 - 1,191 mg/dL 4,782   IgM 50 - 956 mg/dL 213    SPEP    Latest Ref Rng & Units 07/22/2022    9:53 AM  Serum Protein Electrophoresis  Total Protein 6.1 - 8.1 g/dL 6.1 - 8.1 g/dL 8.3    8.4    Albumin 3.8 - 4.8 g/dL 4.5   Alpha-1 0.2 - 0.3 g/dL 0.4   Alpha-2 0.5 - 0.9 g/dL 0.8   Beta Globulin 0.4 - 0.6 g/dL 0.5   Beta 2 0.2 - 0.5 g/dL 0.3   Gamma Globulin 0.8 - 1.7 g/dL 1.9    Y8MV No results found for: "G6PDH" TPMT No results found for: "TPMT"   Chest x-ray: 03/20/21 - No active disease  Contraception: IUD  Alcohol use: rarely (once every 6 months)  Assessment/Plan:   Patient was counseled on the purpose, proper use, and adverse effects of methotrexate including nausea, infection, and signs and symptoms of pneumonitis. Discussed that there is the possibility of an increased risk of malignancy, specifically lymphomas, but it is not well understood if this increased risk is due to the medication or the disease state.  Instructed patient that medication should be held for infection and prior to surgery.  Advised patient to avoid live vaccines. Recommend annual influenza, Pneumovax 23, Prevnar 13, and Shingrix as indicated.   Reviewed instructions with patient to take methotrexate weekly along with folic acid daily.  Discussed the importance of frequent monitoring  of kidney and liver function and blood counts, and provided patient with standing lab instructions.  Counseled patient to avoid NSAIDs and alcohol while on methotrexate.  Provided patient with educational materials on methotrexate and answered all questions.   Patient voiced understanding.  Patient consented to methotrexate use.  Will upload into chart.    Dose of methotrexate will be 15mg  PO once weekly along with folic acid 2mg  daily. Prescription sent. We will work on authorization for injectable MTX (rasuvo or Otrexup). She does not have hand dexterity or strength to use MTX vial/syringe. She has previously taken oral methotrexate with inadequate response.  Will continue appeal for Simponi as she has failed Humira, Enbrel in past.  Chesley Mires, PharmD, MPH, BCPS, CPP Clinical Pharmacist (Rheumatology and  Pulmonology)

## 2022-08-26 NOTE — Patient Instructions (Addendum)
Prednisone 5 mg tablet, 4 tablets by mouth every morning for 1 week, 3 tablets by mouth every morning for 1 week, 2 tablets by mouth every morning for 1 week, 1 tablet by mouth every morning for 1 week  Standing Labs We placed an order today for your standing lab work.   Please have your standing labs drawn in weeks x 2 and then every 3 months  Please have your labs drawn 2 weeks prior to your appointment so that the provider can discuss your lab results at your appointment, if possible.  Please note that you may see your imaging and lab results in MyChart before we have reviewed them. We will contact you once all results are reviewed. Please allow our office up to 72 hours to thoroughly review all of the results before contacting the office for clarification of your results.  WALK-IN LAB HOURS  Monday through Thursday from 8:00 am -12:30 pm and 1:00 pm-5:00 pm and Friday from 8:00 am-12:00 pm.  Patients with office visits requiring labs will be seen before walk-in labs.  You may encounter longer than normal wait times. Please allow additional time. Wait times may be shorter on  Monday and Thursday afternoons.  We do not book appointments for walk-in labs. We appreciate your patience and understanding with our staff.   Labs are drawn by [facility]. Please bring your co-pay at the time of your lab draw.  You may receive a bill from [facility] for your lab work.  Please note if you are on Hydroxychloroquine and and an order has been placed for a Hydroxychloroquine level,  you will need to have it drawn 4 hours or more after your last dose.  If you wish to have your labs drawn at another location, please call the office 24 hours in advance so we can fax the orders.  The office is located at [address], [address], [place], [place] [address]   If you have any questions regarding directions or hours of operation,  please call [phone].   As a reminder, please drink plenty of water prior to  coming for your lab work. Thanks!   Prednisone Tablets What is this medication? PREDNISONE (PRED ni sone) treats many conditions such as asthma, allergic reactions, arthritis, inflammatory bowel diseases, adrenal, and blood or bone marrow disorders. It works by decreasing inflammation, slowing down an overactive immune system, or replacing cortisol normally made in the body. Cortisol is a hormone that plays an important role in how the body responds to stress, illness, and injury. It belongs to a group of medications called steroids. This medicine may be used for other purposes; ask your health care provider or pharmacist if you have questions. COMMON BRAND NAME(S): Deltasone, Predone, Sterapred, Sterapred DS What should I tell my care team before I take this medication? They need to know if you have any of these conditions: Cushing's syndrome Diabetes Glaucoma Heart disease High blood pressure Infection (especially a virus infection such as chickenpox, cold sores, or herpes) Kidney disease Liver disease Mental illness Myasthenia gravis Osteoporosis Seizures Stomach or intestine problems Thyroid disease An unusual or allergic reaction to lactose, prednisone, other medications, foods, dyes, or preservatives Pregnant or trying to get pregnant Breast-feeding How should I use this medication? Take this medication by mouth with a glass of water. Follow the directions on the prescription label. Take this medication with food. If you are taking this medication once a day, take it in the morning. Do not take more medication  than you are told to take. Do not suddenly stop taking your medication because you may develop a severe reaction. Your care team will tell you how much medication to take. If your care team wants you to stop the medication, the dose may be slowly lowered over time to avoid any side effects. Talk to your care team about the use of this medication in children. Special care may  be needed. Overdosage: If you think you have taken too much of this medicine contact a poison control center or emergency room at once. NOTE: This medicine is only for you. Do not share this medicine with others. What if I miss a dose? If you miss a dose, take it as soon as you can. If it is almost time for your next dose, talk to your care team. You may need to miss a dose or take an extra dose. Do not take double or extra doses without advice. What may interact with this medication? Do not take this medication with any of the following: Metyrapone Mifepristone This medication may also interact with the following: Aminoglutethimide Amphotericin B Aspirin and aspirin-like medications Barbiturates Certain medications for diabetes, like glipizide or glyburide Cholestyramine Cholinesterase inhibitors Cyclosporine Digoxin Diuretics Ephedrine Female hormones, like estrogens and birth control pills Isoniazid Ketoconazole NSAIDS, medications for pain and inflammation, like ibuprofen or naproxen Phenytoin Rifampin Toxoids Vaccines Warfarin This list may not describe all possible interactions. Give your health care provider a list of all the medicines, herbs, non-prescription drugs, or dietary supplements you use. Also tell them if you smoke, drink alcohol, or use illegal drugs. Some items may interact with your medicine. What should I watch for while using this medication? Visit your care team for regular checks on your progress. If you are taking this medication over a prolonged period, carry an identification card with your name and address, the type and dose of your medication, and your care team's name and address. This medication may increase your risk of getting an infection. Tell your care team if you are around anyone with measles or chickenpox, or if you develop sores or blisters that do not heal properly. If you are going to have surgery, tell your care team that you have taken  this medication within the last twelve months. Ask your care team about your diet. You may need to lower the amount of salt you eat. This medication may increase blood sugar. Ask your care team if changes in diet or medications are needed if you have diabetes. What side effects may I notice from receiving this medication? Side effects that you should report to your care team as soon as possible: Allergic reactions--skin rash, itching, hives, swelling of the face, lips, tongue, or throat Cushing syndrome--increased fat around the midsection, upper back, neck, or face, pink or purple stretch marks on the skin, thinning, fragile skin that easily bruises, unexpected hair growth High blood sugar (hyperglycemia)--increased thirst or amount of urine, unusual weakness or fatigue, blurry vision Increase in blood pressure Infection--fever, chills, cough, sore throat, wounds that don't heal, pain or trouble when passing urine, general feeling of discomfort or being unwell Low adrenal gland function--nausea, vomiting, loss of appetite, unusual weakness or fatigue, dizziness Mood and behavior changes--anxiety, nervousness, confusion, hallucinations, irritability, hostility, thoughts of suicide or self-harm, worsening mood, feelings of depression Stomach bleeding--bloody or black, tar-like stools, vomiting blood or brown material that looks like coffee grounds Swelling of the ankles, hands, or feet Side effects that usually do not require  medical attention (report to your care team if they continue or are bothersome): Acne General discomfort and fatigue Headache Increase in appetite Nausea Trouble sleeping Weight gain This list may not describe all possible side effects. Call your doctor for medical advice about side effects. You may report side effects to FDA at 1-800-FDA-1088. Where should I keep my medication? Keep out of the reach of children. Store at room temperature between 15 and 30 degrees C (59  and 86 degrees F). Protect from light. Keep container tightly closed. Throw away any unused medication after the expiration date. NOTE: This sheet is a summary. It may not cover all possible information. If you have questions about this medicine, talk to your doctor, pharmacist, or health care provider.  2024 Elsevier/Gold Standard (2020-06-06 00:00:00)   Methotrexate Injection What is this medication? METHOTREXATE (METH oh TREX ate) treats inflammatory conditions such as arthritis and psoriasis. It works by decreasing inflammation, which can reduce pain and prevent long-term injury to the joints and skin. It may also be used to treat some types of cancer. It works by slowing down the growth of cancer cells. This medicine may be used for other purposes; ask your health care provider or pharmacist if you have questions. What should I tell my care team before I take this medication? They need to know if you have any of these conditions: Fluid in the stomach area or lungs If you often drink alcohol Infection or immune system problems Kidney disease Liver disease Low blood counts (white cells, platelets, or red blood cells) Lung disease Recent or ongoing radiation Recent or upcoming vaccine Stomach ulcers Ulcerative colitis An unusual or allergic reaction to methotrexate, other medications, foods, dyes, or preservatives Pregnant or trying to get pregnant Breast-feeding How should I use this medication? This medication is for infusion into a vein or for injection into muscle or into the spinal fluid (whichever applies). It is usually given in a hospital or clinic setting. In rare cases, you might get this medication at home. You will be taught how to give this medication. Use exactly as directed. Take your medication at regular intervals. Do not take your medication more often than directed. If this medication is used for arthritis or psoriasis, it should be taken weekly, NOT daily. It is  important that you put your used needles and syringes in a special sharps container. Do not put them in a trash can. If you do not have a sharps container, call your pharmacist or care team to get one. Talk to your care team about the use of this medication in children. While this medication may be prescribed for children as young as 2 years for selected conditions, precautions do apply. Overdosage: If you think you have taken too much of this medicine contact a poison control center or emergency room at once. NOTE: This medicine is only for you. Do not share this medicine with others. What if I miss a dose? It is important not to miss your dose. Call your care team if you are unable to keep an appointment. If you give yourself the medication, and you miss a dose, talk with your care team. Do not take double or extra doses. What may interact with this medication? Do not take this medication with any of the following: Acitretin This medication may also interact with the following: Aspirin or aspirin-like medications including salicylates Azathioprine Certain antibiotics like chloramphenicol, penicillin, tetracycline Certain medications that treat or prevent blood clots like warfarin, apixaban, dabigatran,  and rivaroxaban Certain medications for stomach problems like esomeprazole, omeprazole, pantoprazole Cyclosporine Dapsone Diuretics Folic acid Gold Hydroxychloroquine Live virus vaccines Medications for infection like acyclovir, adefovir, amphotericin B, bacitracin, cidofovir, foscarnet, ganciclovir, gentamicin, pentamidine, vancomycin Mercaptopurine NSAIDs, medications for pain and inflammation, like ibuprofen or naproxen Pamidronate Pemetrexed Penicillamine Phenylbutazone Phenytoin Probenecid Pyrimethamine Retinoids such as isotretinoin and tretinoin Steroid medications like prednisone or cortisone Sulfonamides like sulfasalazine and  trimethoprim/sulfamethoxazole Theophylline Zoledronic acid This list may not describe all possible interactions. Give your health care provider a list of all the medicines, herbs, non-prescription drugs, or dietary supplements you use. Also tell them if you smoke, drink alcohol, or use illegal drugs. Some items may interact with your medicine. What should I watch for while using this medication? This medication may make you feel generally unwell. This is not uncommon as chemotherapy can affect healthy cells as well as cancer cells. Report any side effects. Continue your course of treatment even though you feel ill unless your care team tells you to stop. Your condition will be monitored carefully while you are receiving this medication. Avoid alcoholic drinks. This medication can cause serious side effects. To reduce the risk, your care team may give you other medications to take before receiving this one. Be sure to follow the directions from your care team. This medication can make you more sensitive to the sun. Keep out of the sun. If you cannot avoid being in the sun, wear protective clothing and use sunscreen. Do not use sun lamps or tanning beds/booths. You may get drowsy or dizzy. Do not drive, use machinery, or do anything that needs mental alertness until you know how this medication affects you. Do not stand or sit up quickly, especially if you are an older patient. This reduces the risk of dizzy or fainting spells. You may need blood work while you are taking this medication. Call your care team for advice if you get a fever, chills or sore throat, or other symptoms of a cold or flu. Do not treat yourself. This medication decreases your body's ability to fight infections. Try to avoid being around people who are sick. This medication may increase your risk to bruise or bleed. Call your care team if you notice any unusual bleeding. Be careful brushing or flossing your teeth or using a  toothpick because you may get an infection or bleed more easily. If you have any dental work done, tell your dentist you are receiving this medication Check with your care team if you get an attack of severe diarrhea, nausea and vomiting, or if you sweat a lot. The loss of too much body fluid can make it dangerous for you to take this medication. Talk to your care team about your risk of cancer. You may be more at risk for certain types of cancers if you take this medication. Do not become pregnant while taking this medication or for 6 months after stopping it. Women should inform their care team if they wish to become pregnant or think they might be pregnant. Men should not father a child while taking this medication and for 3 months after stopping it. There is potential for serious harm to an unborn child. Talk to your care team for more information. Do not breast-feed an infant while taking this medication or for 1 week after stopping it. This medication may make it more difficult to get pregnant or father a child. Talk to your care team if you are concerned about your fertility. What  side effects may I notice from receiving this medication? Side effects that you should report to your care team as soon as possible: Allergic reactions--skin rash, itching, hives, swelling of the face, lips, tongue, or throat Blood clot--pain, swelling, or warmth in the leg, shortness of breath, chest pain Dry cough, shortness of breath or trouble breathing Infection--fever, chills, cough, sore throat, wounds that don't heal, pain or trouble when passing urine, general feeling of discomfort or being unwell Kidney injury--decrease in the amount of urine, swelling of the ankles, hands, or feet Liver injury--right upper belly pain, loss of appetite, nausea, light-colored stool, dark yellow or brown urine, yellowing of the skin or eyes, unusual weakness or fatigue Low red blood cell count--unusual weakness or fatigue,  dizziness, headache, trouble breathing Redness, blistering, peeling, or loosening of the skin, including inside the mouth Seizures Unusual bruising or bleeding Side effects that usually do not require medical attention (report to your care team if they continue or are bothersome): Diarrhea Dizziness Hair loss Nausea Pain, redness, or swelling with sores inside the mouth or throat Vomiting This list may not describe all possible side effects. Call your doctor for medical advice about side effects. You may report side effects to FDA at 1-800-FDA-1088. Where should I keep my medication? This medication is given in a hospital or clinic. It will not be stored at home. NOTE: This sheet is a summary. It may not cover all possible information. If you have questions about this medicine, talk to your doctor, pharmacist, or health care provider.  2024 Elsevier/Gold Standard (2020-05-12 00:00:00)   Vaccines You are taking a medication(s) that can suppress your immune system.  The following immunizations are recommended: Flu annually Covid-19  Td/Tdap (tetanus, diphtheria, pertussis) every 10 years Pneumonia (Prevnar 15 then Pneumovax 23 at least 1 year apart.  Alternatively, can take Prevnar 20 without needing additional dose) Shingrix: 2 doses from 4 weeks to 6 months apart  Please check with your PCP to make sure you are up to date.   If you have signs or symptoms of an infection or start antibiotics: First, call your PCP for workup of your infection. Hold your medication through the infection, until you complete your antibiotics, and until symptoms resolve if you take the following: Injectable medication (Actemra, Benlysta, Cimzia, Cosentyx, Enbrel, Humira, Kevzara, Orencia, Remicade, Simponi, Stelara, Taltz, Tremfya) Methotrexate Leflunomide (Arava) Mycophenolate (Cellcept) Harriette Ohara, Olumiant, or Rinvoq   Please get an annual skin examination to screen for skin cancer while you are on  Simponi Aria infusions.  Please use sunscreen and sun protection.

## 2022-08-26 NOTE — Telephone Encounter (Signed)
Please start BIV for Rasuvo/Otrexup  Dose: 20mg  SQ weekly  Has previously taking oral MTX with waning response and does not have hand dexterity or strength to use MTX vial/syringe  Chesley Mires, PharmD, MPH, BCPS, CPP Clinical Pharmacist (Rheumatology and Pulmonology)

## 2022-08-26 NOTE — Progress Notes (Signed)
Office Visit Note  Patient: Erica Fox             Date of Birth: 1985-05-15           MRN: [id number]             PCP: Avanell Shackleton, NP-C Referring: Avanell Shackleton, NP-C Visit Date: 08/26/2022 Occupation: @GUAROCC @  Subjective:  Pain and swelling of left knee  History of Present Illness: Erica Fox is a 37 y.o. female with seronegative rheumatoid arthritis.  She returns today after her last visit on Jul 22, 2022.  She states she has been having progressively increasing pain and swelling in her left knee joint.  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  She has been having increased pain and discomfort in the left knee joint with routine activities.  She is having difficulty getting up from the low seat.  She went to the urgent care last week due to severe pain and discomfort in her left knee joint.  She was giving a Toradol intramuscular injection which did not give much relief.  He continues to have pain and swelling in her bilateral hands.  She has difficulty making a fist.  She has discomfort in her bilateral feet.  She has had several right knee joint aspirations in the past.  The last aspiration was about 4 to 5 years ago.  He is awaiting Simponi Aria infusions to be approved.    Activities of Daily Living:  Patient reports morning stiffness for all day. Patient Reports nocturnal pain.  Difficulty dressing/grooming: Reports Difficulty climbing stairs: Reports Difficulty getting out of chair: Reports Difficulty using hands for taps, buttons, cutlery, and/or writing: Reports  Review of Systems  Constitutional:  Positive for fatigue.  HENT:  Negative for mouth sores and mouth dryness.   Eyes:  Negative for dryness.  Respiratory:  Negative for shortness of breath.   Cardiovascular:  Negative for chest pain and palpitations.  Gastrointestinal:  Negative for blood in stool, constipation and diarrhea.   Endocrine: Negative for increased urination.  Genitourinary:  Negative for involuntary urination.  Musculoskeletal:  Positive for joint pain, gait problem, joint pain, joint swelling, myalgias, muscle weakness, morning stiffness, muscle tenderness and myalgias.  Skin:  Negative for color change, rash, hair loss and sensitivity to sunlight.  Allergic/Immunologic: Negative for susceptible to infections.  Neurological:  Negative for dizziness and headaches.  Hematological:  Negative for swollen glands.  Psychiatric/Behavioral:  Negative for depressed mood and sleep disturbance. The patient is nervous/anxious.     PMFS History:  Patient Active Problem List   Diagnosis Date Noted   Seasonal allergies 08/26/2022   Rheumatoid arthritis of multiple sites with negative rheumatoid factor (HCC) 07/22/2022    Past Medical History:  Diagnosis Date   Anemia    Degenerative joint disease     Family History  Problem Relation Age of Onset   Heart disease Mother    Breast cancer Paternal Aunt    Healthy Son    Healthy Son    Past Surgical History:  Procedure Laterality Date   CESAREAN SECTION     2007, 2012   EYE SURGERY Right    age 42, tighten muscle for lazy eye   FOOT SURGERY Left    hammertoe correction & screws placed   FOOT SURGERY Right 05/07/2022   hammertoe correction   WISDOM TOOTH EXTRACTION  2014  Social History   Social History Narrative   Not on file    There is no immunization history on file for this patient.   Objective: Vital Signs: BP (!) 140/83 (BP Location: Right Arm, Patient Position: Sitting, Cuff Size: Large)   Pulse 72   Resp 16   Ht 5\' 8"  (1.727 m)   Wt 269 lb 9.6 oz (122.3 kg)   BMI 40.99 kg/m    Physical Exam Vitals and nursing note reviewed.  Constitutional:      Appearance: She is well-developed.  HENT:     Head: Normocephalic and atraumatic.  Eyes:     Conjunctiva/sclera: Conjunctivae normal.  Cardiovascular:     Rate and Rhythm:  Normal rate and regular rhythm.     Heart sounds: Normal heart sounds.  Pulmonary:     Effort: Pulmonary effort is normal.     Breath sounds: Normal breath sounds.  Abdominal:     General: Bowel sounds are normal.     Palpations: Abdomen is soft.  Musculoskeletal:     Cervical back: Normal range of motion.  Lymphadenopathy:     Cervical: No cervical adenopathy.  Skin:    General: Skin is warm and dry.     Capillary Refill: Capillary refill takes less than 2 seconds.  Neurological:     Mental Status: She is alert and oriented to person, place, and time.  Psychiatric:        Behavior: Behavior normal.      Musculoskeletal Exam: She had limited range of motion of the cervical spine.  Thoracic and lumbar spine were in good range of motion.  She had no SI joint tenderness.  Shoulder joints and elbow joints in good range of motion.  Wrist joints are fused due to rheumatoid arthritis with minimal extension of flexion.  She had warmth on palpation of bilateral wrist joints.  She had synovial thickening over bilateral MCP joints with no synovitis.  She has synovial thickening over bilateral PIP joints with no synovitis.  Hip joints in good range of motion.  She has warmth swelling and effusion in her left knee joint.  She has warmth on palpation of her left ankle joint which had limited range of motion.  She has surgical scar over her right foot.  No synovitis was noted over MTP joints.  CDAI Exam: CDAI Score: 8  Patient Global: 10 mm; Provider Global: 10 mm Swollen: 4 ; Tender: 4  Joint Exam 08/26/2022      Right  Left  Wrist  Swollen Tender  Swollen Tender  Knee     Swollen Tender  Ankle     Swollen Tender     Investigation: No additional findings.  Imaging: No results found.  Recent Labs: Lab Results  Component Value Date   WBC 6.1 07/22/2022   HGB 14.5 07/22/2022   PLT 210 07/22/2022   NA 136 07/22/2022   K 3.9 07/22/2022   CL 105 07/22/2022   CO2 16 (L) 07/22/2022    GLUCOSE 98 07/22/2022   BUN 12 07/22/2022   CREATININE 0.83 07/22/2022   BILITOT 0.3 07/22/2022   ALKPHOS 66 03/11/2022   AST 18 07/22/2022   ALT 17 07/22/2022   PROT 8.3 (H) 07/22/2022   PROT 8.4 (H) 07/22/2022   ALBUMIN 4.8 03/11/2022   CALCIUM 9.3 07/22/2022   QFTBGOLDPLUS NEGATIVE 07/22/2022   Jul 22, 2022 SPEP normal, IgG elevated, IgM elevated, IgA low, TB Gold negative, hepatitis B-, hepatitis C negative, CK20 5, MCV  negative, anti-CCP negative March 11, 2022 ANA negative, RF negative, CRP<1.0, ESR 46, TSH normal Speciality Comments: Enbrel-discontinued Humira-inadequate response  Procedures:  Large Joint Inj: L knee on 08/26/2022 8:29 AM Indications: pain Details: 27 G 1.5 in needle, medial approach  Arthrogram: No  Medications: 60 mg triamcinolone acetonide 40 MG/ML; 3 mL lidocaine 1 % Aspirate: 0 mL Outcome: tolerated well, no immediate complications Procedure, treatment alternatives, risks and benefits explained, specific risks discussed. Consent was given by the patient. Immediately prior to procedure a time out was called to verify the correct patient, procedure, equipment, support staff and site/side marked as required. Patient was prepped and draped in the usual sterile fashion.     Allergies: Penicillins   Assessment / Plan:     Visit Diagnoses: Rheumatoid arthritis of multiple sites with negative rheumatoid factor (HCC) - RF negative, anti-CCP negative, MCV negative, elevated sedimentation rate.  Increased pain and swelling since November 2023.  Patient was diagnosed with JIA at age 39.  She had different DMARDs and Biologics.  Methotrexate and Enbrel were used until she was 15.  She stopped medications that she moved out of town.  She was also given Humira at one point which she took for about 1-1/2-year and stopped due to an inadequate response.  She has been having increased pain and discomfort in her joints since November 2023.  She was seen last in the office  on Jul 22, 2022.  At that time different treatment options were discussed.  She wanted to proceed with Simponi Aria infusions as she does not like taking subcutaneous injections.  Simponi Aria infusions were not approved by her insurance.  Patient has failed Enbrel and Humira both.  We will appeal for Simponi Aria infusions.  Today she presents with increased pain and swelling in her left knee joint with effusion.  She has been having increased pain and discomfort in her bilateral hands and her left ankle.  Synovitis was noted in multiple joints as described above.  Patient has been experiencing difficulty doing ADLs and getting up from the low seat.  She went to the emergency room last week and had Toradol injection.  I had a detailed discussion regarding different treatment options.  I will give her prednisone as a bridging therapy starting at 20 mg and taper by 5 mg every week.  We also decided to start her on methotrexate subcutaneous.  Indications side effects contraindications were discussed.  Patient wants to proceed with subcutaneous methotrexate.  We will try oral methotrexate first.  If patient does not tolerate  oral methotrexate then we will switch her to subcu methotrexate.  The dose will be methotrexate 15 mg oral or subcu weekly for 2 weeks and then increase it to 20 mg oral subcu weekly.  Will check labs in 2 weeks x 2 and then every 3 months.  Information was placed in the AVS.  Informed consent was obtained and is scanned in the chart.  She will be on folic acid 1 mg, 2 tablets p.o. daily.  Drug Counseling TB Gold: Jul 22, 2022 Hepatitis panel: Jul 22, 2022  Chest-xray: Pending  Contraception: IUD  Alcohol use: None  Patient was counseled on the purpose, proper use, and adverse effects of methotrexate including nausea, infection, and signs and symptoms of pneumonitis.  Reviewed instructions with patient to take methotrexate weekly along with folic acid daily.  Discussed the importance of  frequent monitoring of kidney and liver function and blood counts, and provided patient with  standing lab instructions.  Counseled patient to avoid NSAIDs and alcohol while on methotrexate.  Provided patient with educational materials on methotrexate and answered all questions.  Advised patient to get annual influenza vaccine and to get a pneumococcal vaccine if patient has not already had one.  Patient voiced understanding.  Patient consented to methotrexate use.  Will upload into chart.     High risk medication use -indications side effects contraindications of prednisone, methotrexate and Simponi Aria were discussed at length.  Patient would get labs in 2 weeks x 2 and then every 3 months.  Information on immunization was placed in the AVS.  She was advised to hold Simponi Aria and methotrexate if she develops an infection resume after the infection resolves.  Will also get a baseline chest x-ray.  Patient does not consume alcohol and she has an  IUD.  Annual skin examination to screen for skin cancer was advised.  Use of sunscreen and sun protection was discussed.  Pain in both hands -she had warmth on palpation of bilateral wrist joints today.  Synovial thickening was noted over MCPs and PIP joints with incomplete fist formation bilaterally.  She has no range of motion in wrist joints.  X-rays obtained at the last visit showed severe intercarpal and radiocarpal joint space narrowing.  X-ray findings were reviewed with the patient.  Chronic pain of both knees - History of recurrent effusion.  X-ray showed moderate osteoarthritis and patellar femoral narrowing.  She has had right knee joint aspirations in the past.  Last right knee joint aspiration was 5 years ago per patient.  Effusion, left knee-she presents today with severe pain and discomfort in the left knee joint.  She had moderate effusion in her left knee joint and warmth.  After informed consent was obtained left knee joint was aspirated but no  fluid could be obtained.  Left knee joint was injected with lidocaine and cortisone as described above.  Patient taught the procedure well.  Postprocedure instructions were given.  Pain in both feet - Pain and discomfort in bilateral feet.  MRI of the ankle joint August 2022 showed early tibiotalar and subtalar joint degenerative changes.  Status post collation of tibiotalar joint in December 2023 by Dr. Abbott Pao  JIA (juvenile idiopathic arthritis) (HCC) - Diagnosed at age 31.  Treated with different DMARDs and Biologics until age 60.  She stopped methotrexate and Enbrel at age 36.  Seasonal allergies  Orders: Orders Placed This Encounter  Procedures   Large Joint Inj: L knee   Meds ordered this encounter  Medications   predniSONE (DELTASONE) 10 MG tablet    Sig: Take 2 tabs by mouth x 7 days, then 1.5 tabs x 7 days, then 1 tab x 7 days, then 0.5 tabs x 7 days.    Dispense:  35 tablet    Refill:  0   methotrexate (RHEUMATREX) 2.5 MG tablet    Sig: Take 6 tablets (15 mg total) by mouth once a week. Caution:Chemotherapy. Protect from light.    Dispense:  24 tablet    Refill:  0   folic acid (FOLVITE) 1 MG tablet    Sig: Take 2 tablets (2 mg total) by mouth daily.    Dispense:  180 tablet    Refill:  3    Face-to-face time spent with patient was 50 minutes. Greater than 50% of time was spent in counseling and coordination of care.  Follow-Up Instructions: Return in about 6 weeks (around 10/07/2022) for Rheumatoid  arthritis.   Pollyann Savoy, MD  Note - This record has been created using Animal nutritionist.  Chart creation errors have been sought, but may not always  have been located. Such creation errors do not reflect on  the standard of medical care.

## 2022-08-27 ENCOUNTER — Other Ambulatory Visit (HOSPITAL_COMMUNITY): Payer: Self-pay

## 2022-08-27 NOTE — Telephone Encounter (Signed)
Attempted test claims for both formulations. Otrexup appears to be the preferred formulary product, however [facility] is not contracted to fill with this insurance and there is no specific information provided that reveals which pharmacy the pt needs to fill through. [facility] so [facility].  Submitted a Prior Authorization request to  [employer]  for OTREXUP via CoverMyMeds. Will update once we receive a response.  Key: Erica Fox

## 2022-09-03 ENCOUNTER — Telehealth: Payer: Self-pay

## 2022-09-03 NOTE — Telephone Encounter (Signed)
Patient contacted the office and states she is supposed to get her labs done at the two week mark after starting a new medication. Patient states she plans to stop by the office next week to get those labs drawn.

## 2022-09-03 NOTE — Telephone Encounter (Signed)
PA still pending.  

## 2022-09-07 ENCOUNTER — Other Ambulatory Visit: Payer: Self-pay | Admitting: *Deleted

## 2022-09-07 DIAGNOSIS — Z79899 Other long term (current) drug therapy: Secondary | ICD-10-CM

## 2022-09-07 LAB — CBC WITH DIFFERENTIAL/PLATELET
Absolute Monocytes: 774 cells/uL (ref 200–950)
Eosinophils Absolute: 44 cells/uL (ref 15–500)
Lymphs Abs: 2920 cells/uL (ref 850–3900)
MCHC: 32.2 g/dL (ref 32.0–36.0)
MPV: 10.3 fL (ref 7.5–12.5)
Platelets: 245 10*3/uL (ref 140–400)
WBC: 14.6 10*3/uL — ABNORMAL HIGH (ref 3.8–10.8)

## 2022-09-07 NOTE — Telephone Encounter (Signed)
Spoke with [facility] about status of appeal which has been downgraded from urgent to a standard appeal, per rep. Should expect to hear back within 30 days from 08/26/22.  Darolyn Rua, PharmD Student [employer]

## 2022-09-08 LAB — COMPLETE METABOLIC PANEL WITH GFR
AG Ratio: 1.5 (calc) (ref 1.0–2.5)
ALT: 9 U/L (ref 6–29)
AST: 7 U/L — ABNORMAL LOW (ref 10–30)
Albumin: 4.2 g/dL (ref 3.6–5.1)
Alkaline phosphatase (APISO): 72 U/L (ref 31–125)
BUN: 12 mg/dL (ref 7–25)
CO2: 25 mmol/L (ref 20–32)
Calcium: 9.1 mg/dL (ref 8.6–10.2)
Chloride: 106 mmol/L (ref 98–110)
Creat: 0.63 mg/dL (ref 0.50–0.97)
Globulin: 2.8 g/dL (calc) (ref 1.9–3.7)
Glucose, Bld: 120 mg/dL — ABNORMAL HIGH (ref 65–99)
Potassium: 4 mmol/L (ref 3.5–5.3)
Sodium: 137 mmol/L (ref 135–146)
Total Bilirubin: 0.4 mg/dL (ref 0.2–1.2)
Total Protein: 7 g/dL (ref 6.1–8.1)
eGFR: 116 mL/min/{1.73_m2} (ref >=60)

## 2022-09-08 LAB — CBC WITH DIFFERENTIAL/PLATELET
Basophils Absolute: 58 cells/uL (ref 0–200)
Basophils Relative: 0.4 %
Eosinophils Relative: 0.3 %
HCT: 39.7 % (ref 35.0–45.0)
Hemoglobin: 12.8 g/dL (ref 11.7–15.5)
MCH: 28.8 pg (ref 27.0–33.0)
MCV: 89.4 fL (ref 80.0–100.0)
Monocytes Relative: 5.3 %
Neutro Abs: 10804 cells/uL — ABNORMAL HIGH (ref 1500–7800)
Neutrophils Relative %: 74 %
RBC: 4.44 10*6/uL (ref 3.80–5.10)
RDW: 12.9 % (ref 11.0–15.0)
Total Lymphocyte: 20 %

## 2022-09-08 NOTE — Progress Notes (Signed)
Glucose is 120.  Rest of CMP WNL.  WBC count is elevated-14.6. absolute neutrophils are elevated. Rest of CBC WNL Elevation in WBC count is likely due to prednisone use.

## 2022-09-10 ENCOUNTER — Other Ambulatory Visit (HOSPITAL_COMMUNITY): Payer: Self-pay

## 2022-09-10 MED ORDER — OTREXUP 20 MG/0.4ML ~~LOC~~ SOAJ
20.0000 mg | SUBCUTANEOUS | 2 refills | Status: DC
Start: 2022-09-10 — End: 2022-12-03

## 2022-09-10 NOTE — Telephone Encounter (Signed)
Received notification from  [facility]  regarding a prior authorization for OTREXUP. Authorization has been APPROVED from 09/07/22 to 03/06/23. Approval letter sent to scan center.  Unable to run test claim because patient must fill through  specialty pharmacy [facility]?)  Authorization # [id number] Phone # [phone]  Rx sent to [facility] (Phone: [phone])  Otrexup copay card (covers max $250 per 30 day fill) ID: [id number] BIN: [id number] PCN: [id number] Group: [id number] If your pharmacist has questions while processing the co-pay card, please have him/her call [phone]. Patients with co-pay card questions may call [phone]  MyChart message sent to patient  Chesley Mires, PharmD, MPH, BCPS, CPP Clinical Pharmacist (Rheumatology and Pulmonology)

## 2022-09-16 ENCOUNTER — Other Ambulatory Visit (HOSPITAL_COMMUNITY): Payer: Self-pay

## 2022-09-16 NOTE — Telephone Encounter (Signed)
Received fax from [place]. Denial for Simponi SQ has been OVERTURNED. Simponi is approved from 09/16/22 through 03/15/23  Claim # [id number]  Phone: [phone]  Unable to run test claim because pt must fill with [facility].  Patient enrolled into Simponi copay card via [employer] Enrollment online: BIN: [id number] Group: [id number] ID: [id number]  ATC patient to schedule Simponi new start. Unable to reach. Left VM advising she may call clinic back to schedule . If patient returns call when I am OOO, please schedule new start visit for what works for her as long as she is not having symptoms of infection and is not actively using antibiotics. Will be using sample.  Chesley Mires, PharmD, MPH, BCPS, CPP Clinical Pharmacist (Rheumatology and Pulmonology)

## 2022-09-24 NOTE — Progress Notes (Signed)
Office Visit Note  Patient: Erica Fox             Date of Birth: 20-Jan-1986           MRN: [id number]             PCP: Avanell Shackleton, NP-C Referring: Avanell Shackleton, NP-C Visit Date: 10/06/2022 Occupation: @GUAROCC @  Subjective:  Pain in multiple joints  History of Present Illness: Arshi Duarte is a 37 y.o. female with seronegative rheumatoid arthritis.  She states she finished prednisone taper about 2 weeks ago since then she has been having increased difficulty walking and doing routine activities.  She took methotrexate for about 4 weeks and then switch to OTREXUP about 2 weeks ago.  She has been tolerating methotrexate well without any side effects.  She had first injection of Simponi yesterday.  She tolerated the injection well without any side effects.  She continues to have pain and discomfort in her bilateral wrist joints and bilateral knee joints.  She states that the left ankle is better.  She continues to have stiffness lasting almost all day.  She continues to have nocturnal pain, difficulty getting dressed and climbing stairs.    Activities of Daily Living:  Patient reports morning stiffness for all day. Patient Reports nocturnal pain.  Difficulty dressing/grooming: Reports Difficulty climbing stairs: Reports Difficulty getting out of chair: Reports Difficulty using hands for taps, buttons, cutlery, and/or writing: Reports  Review of Systems  Constitutional:  Positive for fatigue.  HENT:  Negative for mouth sores and mouth dryness.   Eyes:  Negative for dryness.  Respiratory:  Negative for shortness of breath.   Cardiovascular:  Negative for chest pain and palpitations.  Gastrointestinal:  Negative for blood in stool, constipation and diarrhea.  Endocrine: Negative for increased urination.  Genitourinary:  Negative for involuntary urination.  Musculoskeletal:  Positive for joint pain, gait problem, joint pain, joint swelling and morning stiffness. Negative for  myalgias, muscle weakness, muscle tenderness and myalgias.  Skin:  Negative for color change, rash, hair loss and sensitivity to sunlight.  Allergic/Immunologic: Negative for susceptible to infections.  Neurological:  Positive for headaches. Negative for dizziness and numbness.  Hematological:  Negative for swollen glands.  Psychiatric/Behavioral:  Negative for depressed mood and sleep disturbance. The patient is nervous/anxious.     PMFS History:  Patient Active Problem List   Diagnosis Date Noted   Seasonal allergies 08/26/2022   Rheumatoid arthritis of multiple sites with negative rheumatoid factor (HCC) 07/22/2022    Past Medical History:  Diagnosis Date   Anemia    Degenerative joint disease     Family History  Problem Relation Age of Onset   Heart disease Mother    Breast cancer Paternal Aunt    Healthy Son    Healthy Son    Past Surgical History:  Procedure Laterality Date   CESAREAN SECTION     2007, 2012   EYE SURGERY Right    age 24, tighten muscle for lazy eye   FOOT SURGERY Left    hammertoe correction & screws placed   FOOT SURGERY Right 05/07/2022   hammertoe correction   WISDOM TOOTH EXTRACTION  2014   Social History   Social History Narrative   Not on file    There is no immunization history on file for this patient.   Objective: Vital Signs: BP 135/83 (BP Location: Left Arm, Patient Position: Sitting, Cuff Size: Large)   Pulse 66   Resp 16  Ht 5\' 8"  (1.727 m)   Wt 277 lb 3.2 oz (125.7 kg)   BMI 42.15 kg/m    Physical Exam Vitals and nursing note reviewed.  Constitutional:      Appearance: She is well-developed.  HENT:     Head: Normocephalic and atraumatic.  Eyes:     Conjunctiva/sclera: Conjunctivae normal.  Cardiovascular:     Rate and Rhythm: Normal rate and regular rhythm.     Heart sounds: Normal heart sounds.  Pulmonary:     Effort: Pulmonary effort is normal.     Breath sounds: Normal breath sounds.  Abdominal:      General: Bowel sounds are normal.     Palpations: Abdomen is soft.  Musculoskeletal:     Cervical back: Normal range of motion.  Lymphadenopathy:     Cervical: No cervical adenopathy.  Skin:    General: Skin is warm and dry.     Capillary Refill: Capillary refill takes less than 2 seconds.  Neurological:     Mental Status: She is alert and oriented to person, place, and time.  Psychiatric:        Behavior: Behavior normal.      Musculoskeletal Exam: She had limited lateral rotation of the cervical spine.  Shoulder joints were in good range of motion.  Elbow joints were in good range of motion.  Bilateral wrist joints are fused with the minimal extension and flexion.  No synovitis was noted over wrist joints.  She has synovial thickening over bilateral MCP joints and PIP joints.  Incomplete fist formation was noted.  Hip joints were in good range of motion.  She has warmth on palpation of her left knee joint.  Effusion has resolved.  There was no tenderness or warmth on palpation of her ankles.  There was no tenderness over MTPs.  CDAI Exam: CDAI Score: 18.5  Patient Global: 75 / 100; Provider Global: 60 / 100 Swollen: 1 ; Tender: 4  Joint Exam 10/06/2022      Right  Left  Wrist   Tender   Tender  Knee   Tender  Swollen Tender     Investigation: No additional findings.  Imaging: No results found.  Recent Labs: Lab Results  Component Value Date   WBC 14.6 (H) 09/07/2022   HGB 12.8 09/07/2022   PLT 245 09/07/2022   NA 137 09/07/2022   K 4.0 09/07/2022   CL 106 09/07/2022   CO2 25 09/07/2022   GLUCOSE 120 (H) 09/07/2022   BUN 12 09/07/2022   CREATININE 0.63 09/07/2022   BILITOT 0.4 09/07/2022   ALKPHOS 66 03/11/2022   AST 7 (L) 09/07/2022   ALT 9 09/07/2022   PROT 7.0 09/07/2022   ALBUMIN 4.8 03/11/2022   CALCIUM 9.1 09/07/2022   QFTBGOLDPLUS NEGATIVE 07/22/2022   Jul 22, 2022 SPEP normal, TB Gold negative, IgG 1839, IgM 508, IgA<5, CK205, anti-CCP negative, MCV  negative, hepatitis B negative, hepatitis C negative  Speciality Comments: Enbrel-discontinued Humira-inadequate response Simponi started October 05, 2022 Methotrexate started August 26, 2022  Procedures:  No procedures performed Allergies: Penicillins   Assessment / Plan:     Visit Diagnoses: Rheumatoid arthritis of multiple sites with negative rheumatoid factor (HCC) - RF negative, anti-CCP negative, MCV negative, elevated sedimentation rate. JIA at age 61.  With severe erosive rheumatoid arthritis.  Prednisone taper was given at the last visit which was helpful.  Patient states her discomfort is coming back since she stopped prednisone 2 weeks ago.  She has  been on methotrexate since August 26, 2022.  She states she took oral methotrexate and then switched to OTREXUP 20 mg subcu weekly about 2 weeks ago.  She had her first Simponi 50 mg subcutaneous injection yesterday.  Patient has been tolerating both Otrexup and Simponi without any side effects.  She has noticed some improvement in her symptoms.  She requested another prednisone taper.  Side effects of prednisone were discussed at length.  I will send a prescription for prednisone 10 mg p.o. daily and taper by 2.5 mg p.o. daily.  High risk medication use -OTREXUP 20 mg subcu weekly, folic acid 2 mg p.o. daily, Simponi 50 mg subcu monthly.  (Inadequate response to Humira, Enbrel discontinued).  September 07, 2022 CBC and CMP were stable.  White cell count was elevated due to prednisone use.  TB Gold was negative on Jul 22, 2022.  Will check labs today.- Plan: CBC with Differential/Platelet, COMPLETE METABOLIC PANEL WITH GFR.  Will contact her once the lab results are available.  She was advised to get labs in 2 months and then every 3 months.  Information about immunization was placed in the AVS.  She was advised to hold Simponi and OTREXUP if she develops an infection resume after the infection resolves.  Annual skin examination to screen for skin cancer was  advised.  Use of sunscreen and sun protection was advised.  Pain in both hands -she has almost no range of motion in her wrist joints.  She has bilateral synovial thickening over wrist joints, MCPs and PIP joints.  She complains of discomfort in her hands.  She has incomplete fist formation.  Severe intercarpal radiocarpal joint space narrowing was noted on the x-rays which were reviewed..  Chronic pain of both knees - Moderate osteoarthritis and moderate chondromalacia patella was noted.  History of knee joint effusion and aspiration in the past.  She continues to have some warmth and discomfort in her knee joints.  Effusion, left knee - Moderate effusion was noted at the last visit.  Knee joint was injected with cortisone.  She had good response to cortisone injection.  She still have some residual warmth.  No effusion was noted today.  Pain in both feet-she has some pain and stiffness in her feet intermittently.  The ankle joint discomfort has resolved.  No swelling was noted over the left ankle.  JIA (juvenile idiopathic arthritis) (HCC) - Diagnosed at age 32.  Treated with different DMARDs and Biologics until age 19.  She stopped methotrexate and Enbrel at age 19.  Elevated sed rate  IgA deficiency (HCC)  Seasonal allergies  Orders: Orders Placed This Encounter  Procedures   CBC with Differential/Platelet   COMPLETE METABOLIC PANEL WITH GFR   Meds ordered this encounter  Medications   predniSONE (DELTASONE) 5 MG tablet    Sig: Take 10mg  po every day x 7 days, take 7.5mg  po every day x 7 days, take 5mg  po every day x 7 days, then take 2.5mg  po every day x 7 days.    Dispense:  35 tablet    Refill:  0     Follow-Up Instructions: Return in about 2 months (around 12/07/2022) for Rheumatoid arthritis.   Pollyann Savoy, MD  Note - This record has been created using Animal nutritionist.  Chart creation errors have been sought, but may not always  have been located. Such creation  errors do not reflect on  the standard of medical care.

## 2022-09-27 ENCOUNTER — Encounter: Payer: BC Managed Care – PPO | Admitting: Rheumatology

## 2022-09-29 NOTE — Telephone Encounter (Signed)
Patient scheduled for Simponi new start on 09/30/2022. She confirms she is not on antibiotics and does not have active infection  Erica Fox, PharmD, MPH, BCPS, CPP Clinical Pharmacist (Rheumatology and Pulmonology)

## 2022-09-30 ENCOUNTER — Ambulatory Visit: Payer: Commercial Managed Care - PPO | Admitting: Pharmacist

## 2022-10-03 NOTE — Progress Notes (Deleted)
Pharmacy Note  Subjective:   Patient presents to clinic today to receive first dose of Simponi for rheumatoid arthritis. Patient currently takes injectable methotrexate. Prior therapy includes: Enbrel, Humira, oral methotrexate  Patient running a fever or have signs/symptoms of infection? {yes/no:20286} Patient currently on antibiotics for the treatment of infection? {yes/no:20286} Patient have any upcoming invasive procedures/surgeries? {yes/no:20286}  Objective: CMP     Component Value Date/Time   NA 137 09/07/2022 1544   K 4.0 09/07/2022 1544   CL 106 09/07/2022 1544   CO2 25 09/07/2022 1544   GLUCOSE 120 (H) 09/07/2022 1544   BUN 12 09/07/2022 1544   CREATININE 0.63 09/07/2022 1544   CALCIUM 9.1 09/07/2022 1544   PROT 7.0 09/07/2022 1544   ALBUMIN 4.8 03/11/2022 1539   AST 7 (L) 09/07/2022 1544   ALT 9 09/07/2022 1544   ALKPHOS 66 03/11/2022 1539   BILITOT 0.4 09/07/2022 1544   GFRNONAA >60 03/20/2021 2043    CBC    Component Value Date/Time   WBC 14.6 (H) 09/07/2022 1544   RBC 4.44 09/07/2022 1544   HGB 12.8 09/07/2022 1544   HCT 39.7 09/07/2022 1544   PLT 245 09/07/2022 1544   MCV 89.4 09/07/2022 1544   MCH 28.8 09/07/2022 1544   MCHC 32.2 09/07/2022 1544   RDW 12.9 09/07/2022 1544   LYMPHSABS 2,920 09/07/2022 1544   MONOABS 0.5 03/11/2022 1539   EOSABS 44 09/07/2022 1544   BASOSABS 58 09/07/2022 1544    Baseline Immunosuppressant Therapy Labs TB GOLD    Latest Ref Rng & Units 07/22/2022    9:53 AM  Quantiferon TB Gold  Quantiferon TB Gold Plus NEGATIVE NEGATIVE    Hepatitis Panel    Latest Ref Rng & Units 07/22/2022    9:53 AM  Hepatitis  Hep B Surface Ag NON-REACTIVE NON-REACTIVE   Hep B IgM NON-REACTIVE NON-REACTIVE   Hep C Ab NON-REACTIVE NON-REACTIVE    HIV No results found for: "HIV" Immunoglobulins    Latest Ref Rng & Units 07/22/2022    9:53 AM  Immunoglobulin Electrophoresis  IgA  47 - 310 mg/dL <5   IgG 284 - 1,324 mg/dL 4,010   IgM  50 - 272 mg/dL 536    SPEP    Latest Ref Rng & Units 09/07/2022    3:44 PM  Serum Protein Electrophoresis  Total Protein 6.1 - 8.1 g/dL 7.0    U4QI No results found for: "G6PDH" TPMT No results found for: "TPMT"   Chest x-ray: ***  Assessment/Plan:  Reviewed importance of holding Simponi with signs/symptoms of an infections, if antibiotics are prescribed to treat an active infection, and with invasive procedures  Demonstrated proper injection technique with Simponi demo device  Patient able to demonstrate proper injection technique using the teach back method.  Patient self injected in the {injsitedsg:28167} with:  Sample Medication: *** NDC: *** Lot: *** Expiration: ***  Patient tolerated well.  Observed for 30 mins in office for adverse reaction and ***.   Patient is to return in 1 month for labs and 6-8 weeks for follow-up appointment.  Standing orders for CBC/CMP and *** placed.  TB gold will be monitored yearly. Lipid panel will be monitored every *** months. Referral to *** Dermatology placed today for yearly skin checks while on TNF inhibitor due to risk for non melanoma skin cancer  *** approved through {specialtycoverage:25706} .   Rx sent to: {SpecialtyPharmacies:25705}.  Patient provided with pharmacy phone number and advised to call later this week to  schedule shipment to home.  Patient will continue *** subcut every *** days in combination with ***.  All questions encouraged and answered.  Instructed patient to call with any further questions or concerns.  Chesley Mires, PharmD, MPH, BCPS, CPP Clinical Pharmacist (Rheumatology and Pulmonology)  10/03/2022 8:28 PM

## 2022-10-05 ENCOUNTER — Ambulatory Visit: Payer: Commercial Managed Care - PPO | Attending: Rheumatology | Admitting: Pharmacist

## 2022-10-05 DIAGNOSIS — M0609 Rheumatoid arthritis without rheumatoid factor, multiple sites: Secondary | ICD-10-CM

## 2022-10-05 DIAGNOSIS — Z7189 Other specified counseling: Secondary | ICD-10-CM

## 2022-10-05 DIAGNOSIS — Z79899 Other long term (current) drug therapy: Secondary | ICD-10-CM

## 2022-10-05 MED ORDER — GOLIMUMAB 50 MG/0.5ML ~~LOC~~ SOAJ
50.0000 mg | SUBCUTANEOUS | 2 refills | Status: DC
Start: 2022-10-05 — End: 2022-12-03

## 2022-10-05 NOTE — Patient Instructions (Addendum)
Your next SIMPONI dose is due on 8/16, 9/16, and every 1 month thereafter  CONTINUE   Otrexup (methotrexate) injection 20mg  once weekly with folic acid 2mg  daily.  HOLD SIMPONI AND OTREXUP if you have signs or symptoms of an infection. You can resume once you feel better or back to your baseline. HOLD SIMPONI AND OTREXUP if you start antibiotics to treat an infection. HOLD SIMPONI AND OTREXUP around the time of surgery/procedures. Your surgeon will be able to provide recommendations on when to hold BEFORE and when you are cleared to RESUME.  Pharmacy information: Your prescription will be shipped from [facility]. Their phone number is [phone]  Please call to schedule shipment and confirm address. They will mail your medication to your home.  Cost information: Your copay should be affordable. If you call the pharmacy and it is not affordable, please double-check that they are billing through your copay card as secondary coverage. That copay card information is: BIN: [id number] Group: [id number] ID: [id number]    Labs are due in 1 month then every 3 months. Lab hours are from Monday to Thursday 8am-12:30pm and 1pm-5pm and Friday 8am-12pm. You do not need an appointment if you come for labs during these times.  How to manage an injection site reaction: Remember the 5 C's: COUNTER - leave on the counter at least 30 minutes but up to overnight to bring medication to room temperature. This may help prevent stinging COLD - place something cold (like an ice gel pack or cold water bottle) on the injection site just before cleansing with alcohol. This may help reduce pain CLARITIN - use Claritin (generic name is loratadine) for the first two weeks of treatment or the day of, the day before, and the day after injecting. This will help to minimize injection site reactions CORTISONE CREAM - apply if injection site is irritated and itching CALL ME - if injection site reaction is  bigger than the size of your fist, looks infected, blisters, or if you develop hives

## 2022-10-05 NOTE — Progress Notes (Signed)
Pharmacy Note  Subjective:   Patient presents to clinic today to receive first dose of Simponi for rheumatoid arthritis. Patient currently takes Otrexup 20mg  SQ weekly. Has taken 2 doses thus far. Reports increasing stiffness since completion of prednisone taper.  Patient running a fever or have signs/symptoms of infection? No Patient currently on antibiotics for the treatment of infection? No Patient have any upcoming invasive procedures/surgeries? No  Objective: CMP     Component Value Date/Time   NA 137 09/07/2022 1544   K 4.0 09/07/2022 1544   CL 106 09/07/2022 1544   CO2 25 09/07/2022 1544   GLUCOSE 120 (H) 09/07/2022 1544   BUN 12 09/07/2022 1544   CREATININE 0.63 09/07/2022 1544   CALCIUM 9.1 09/07/2022 1544   PROT 7.0 09/07/2022 1544   ALBUMIN 4.8 03/11/2022 1539   AST 7 (L) 09/07/2022 1544   ALT 9 09/07/2022 1544   ALKPHOS 66 03/11/2022 1539   BILITOT 0.4 09/07/2022 1544   GFRNONAA >60 03/20/2021 2043    CBC    Component Value Date/Time   WBC 14.6 (H) 09/07/2022 1544   RBC 4.44 09/07/2022 1544   HGB 12.8 09/07/2022 1544   HCT 39.7 09/07/2022 1544   PLT 245 09/07/2022 1544   MCV 89.4 09/07/2022 1544   MCH 28.8 09/07/2022 1544   MCHC 32.2 09/07/2022 1544   RDW 12.9 09/07/2022 1544   LYMPHSABS 2,920 09/07/2022 1544   MONOABS 0.5 03/11/2022 1539   EOSABS 44 09/07/2022 1544   BASOSABS 58 09/07/2022 1544    Baseline Immunosuppressant Therapy Labs TB GOLD    Latest Ref Rng & Units 07/22/2022    9:53 AM  Quantiferon TB Gold  Quantiferon TB Gold Plus NEGATIVE NEGATIVE    Hepatitis Panel    Latest Ref Rng & Units 07/22/2022    9:53 AM  Hepatitis  Hep B Surface Ag NON-REACTIVE NON-REACTIVE   Hep B IgM NON-REACTIVE NON-REACTIVE   Hep C Ab NON-REACTIVE NON-REACTIVE    HIV No results found for: "HIV" Immunoglobulins    Latest Ref Rng & Units 07/22/2022    9:53 AM  Immunoglobulin Electrophoresis  IgA  47 - 310 mg/dL <5   IgG 098 - 1,191 mg/dL 4,782   IgM  50 - 956 mg/dL 213    SPEP    Latest Ref Rng & Units 09/07/2022    3:44 PM  Serum Protein Electrophoresis  Total Protein 6.1 - 8.1 g/dL 7.0    Y8MV No results found for: "G6PDH" TPMT No results found for: "TPMT"   Chest x-ray: 03/20/21 "No active disease"  Assessment/Plan:  Reviewed importance of holding Simponi with signs/symptoms of an infections, if antibiotics are prescribed to treat an active infection, and with invasive procedures  Demonstrated proper injection technique with Simponi demo device  Patient able to demonstrate proper injection technique using the teach back method.  Patient self injected in the right lower abdomen with:  Sample Medication: Simponi 50mg /mL pen injector NDC: 78469-629-52 Lot: [id number] Expiration: 07/2023  Patient tolerated well.  Observed for 30 mins in office for adverse reaction and patient had no redness, itchiness or other side effects.   Patient is to return in 1 month for labs and 6-8 weeks for follow-up appointment.  Standing orders for CBC/CMP are placed.  TB gold will be monitored yearly.  Patient states she is established with Specialty Dermatology placed today for yearly skin checks while on TNF inhibitor due to risk for non melanoma skin cancer  Simponi approved through insurance and  copay card. Rx sent to: [facility].  Patient provided with pharmacy phone number and advised to call later this week to schedule shipment to home.  Patient will continue Simponi 50mg  subcut every 1 month in combination with Otrexup (methotrexate) subcut 20mg  once weekly (and folic acid 2mg  daily)  All questions encouraged and answered.  Instructed patient to call with any further questions or concerns.  Rickey Primus, PharmD Candidate [employer] 2025  Chesley Mires, PharmD, MPH, BCPS, CPP Clinical Pharmacist (Rheumatology and Pulmonology)  10/05/2022 8:05 AM

## 2022-10-06 ENCOUNTER — Ambulatory Visit: Payer: Commercial Managed Care - PPO | Attending: Rheumatology | Admitting: Rheumatology

## 2022-10-06 ENCOUNTER — Encounter: Payer: Self-pay | Admitting: Rheumatology

## 2022-10-06 VITALS — BP 135/83 | HR 66 | Resp 16 | Ht 68.0 in | Wt 277.2 lb

## 2022-10-06 DIAGNOSIS — M79672 Pain in left foot: Secondary | ICD-10-CM

## 2022-10-06 DIAGNOSIS — R700 Elevated erythrocyte sedimentation rate: Secondary | ICD-10-CM

## 2022-10-06 DIAGNOSIS — M79642 Pain in left hand: Secondary | ICD-10-CM

## 2022-10-06 DIAGNOSIS — M79641 Pain in right hand: Secondary | ICD-10-CM

## 2022-10-06 DIAGNOSIS — M25561 Pain in right knee: Secondary | ICD-10-CM | POA: Diagnosis not present

## 2022-10-06 DIAGNOSIS — Z79899 Other long term (current) drug therapy: Secondary | ICD-10-CM | POA: Diagnosis not present

## 2022-10-06 DIAGNOSIS — J302 Other seasonal allergic rhinitis: Secondary | ICD-10-CM

## 2022-10-06 DIAGNOSIS — M0609 Rheumatoid arthritis without rheumatoid factor, multiple sites: Secondary | ICD-10-CM

## 2022-10-06 DIAGNOSIS — G8929 Other chronic pain: Secondary | ICD-10-CM

## 2022-10-06 DIAGNOSIS — M0880 Other juvenile arthritis, unspecified site: Secondary | ICD-10-CM

## 2022-10-06 DIAGNOSIS — M25562 Pain in left knee: Secondary | ICD-10-CM

## 2022-10-06 DIAGNOSIS — M79671 Pain in right foot: Secondary | ICD-10-CM

## 2022-10-06 DIAGNOSIS — D802 Selective deficiency of immunoglobulin A [IgA]: Secondary | ICD-10-CM

## 2022-10-06 DIAGNOSIS — M25462 Effusion, left knee: Secondary | ICD-10-CM

## 2022-10-06 MED ORDER — PREDNISONE 5 MG PO TABS: ORAL_TABLET | ORAL | 0 refills | Status: DC

## 2022-10-06 NOTE — Patient Instructions (Signed)
Standing Labs We placed an order today for your standing lab work.   Please have your standing labs drawn in  2 months and then every 3 months  Please have your labs drawn 2 weeks prior to your appointment so that the provider can discuss your lab results at your appointment, if possible.  Please note that you may see your imaging and lab results in MyChart before we have reviewed them. We will contact you once all results are reviewed. Please allow our office up to 72 hours to thoroughly review all of the results before contacting the office for clarification of your results.  WALK-IN LAB HOURS  Monday through Thursday from 8:00 am -12:30 pm and 1:00 pm-5:00 pm and Friday from 8:00 am-12:00 pm.  Patients with office visits requiring labs will be seen before walk-in labs.  You may encounter longer than normal wait times. Please allow additional time. Wait times may be shorter on  Monday and Thursday afternoons.  We do not book appointments for walk-in labs. We appreciate your patience and understanding with our staff.   Labs are drawn by [facility]. Please bring your co-pay at the time of your lab draw.  You may receive a bill from [facility] for your lab work.  Please note if you are on Hydroxychloroquine and and an order has been placed for a Hydroxychloroquine level,  you will need to have it drawn 4 hours or more after your last dose.  If you wish to have your labs drawn at another location, please call the office 24 hours in advance so we can fax the orders.  The office is located at [address], [address], [place], [place] [address]   If you have any questions regarding directions or hours of operation,  please call [phone].   As a reminder, please drink plenty of water prior to coming for your lab work. Thanks!   Vaccines You are taking a medication(s) that can suppress your immune system.  The following immunizations are recommended: Flu annually Covid-19  Td/Tdap (tetanus,  diphtheria, pertussis) every 10 years Pneumonia (Prevnar 15 then Pneumovax 23 at least 1 year apart.  Alternatively, can take Prevnar 20 without needing additional dose) Shingrix: 2 doses from 4 weeks to 6 months apart  Please check with your PCP to make sure you are up to date.   If you have signs or symptoms of an infection or start antibiotics: First, call your PCP for workup of your infection. Hold your medication through the infection, until you complete your antibiotics, and until symptoms resolve if you take the following: Injectable medication (Actemra, Benlysta, Cimzia, Cosentyx, Enbrel, Humira, Kevzara, Orencia, Remicade, Simponi, Stelara, Taltz, Tremfya) Methotrexate Leflunomide (Arava) Mycophenolate (Cellcept) Harriette Ohara, Olumiant, or Rinvoq  Please get an annual skin examination to screen for skin cancer while you are on Simponi injection.

## 2022-10-07 LAB — COMPLETE METABOLIC PANEL WITH GFR
AG Ratio: 1.6 (calc) (ref 1.0–2.5)
ALT: 9 U/L (ref 6–29)
AST: 11 U/L (ref 10–30)
Albumin: 4.2 g/dL (ref 3.6–5.1)
Alkaline phosphatase (APISO): 64 U/L (ref 31–125)
BUN: 9 mg/dL (ref 7–25)
CO2: 25 mmol/L (ref 20–32)
Calcium: 8.8 mg/dL (ref 8.6–10.2)
Chloride: 107 mmol/L (ref 98–110)
Creat: 0.66 mg/dL (ref 0.50–0.97)
Globulin: 2.7 g/dL (calc) (ref 1.9–3.7)
Glucose, Bld: 89 mg/dL (ref 65–99)
Potassium: 3.7 mmol/L (ref 3.5–5.3)
Sodium: 139 mmol/L (ref 135–146)
Total Bilirubin: 0.4 mg/dL (ref 0.2–1.2)
Total Protein: 6.9 g/dL (ref 6.1–8.1)
eGFR: 115 mL/min/{1.73_m2} (ref >=60)

## 2022-10-07 LAB — CBC WITH DIFFERENTIAL/PLATELET
Absolute Monocytes: 518 cells/uL (ref 200–950)
Basophils Absolute: 49 cells/uL (ref 0–200)
Basophils Relative: 0.7 %
Eosinophils Absolute: 161 cells/uL (ref 15–500)
Eosinophils Relative: 2.3 %
HCT: 39.4 % (ref 35.0–45.0)
Hemoglobin: 12.7 g/dL (ref 11.7–15.5)
Lymphs Abs: 3129 cells/uL (ref 850–3900)
MCH: 29.5 pg (ref 27.0–33.0)
MCHC: 32.2 g/dL (ref 32.0–36.0)
MCV: 91.6 fL (ref 80.0–100.0)
MPV: 10.6 fL (ref 7.5–12.5)
Monocytes Relative: 7.4 %
Neutro Abs: 3143 cells/uL (ref 1500–7800)
Neutrophils Relative %: 44.9 %
Platelets: 201 10*3/uL (ref 140–400)
RBC: 4.3 10*6/uL (ref 3.80–5.10)
RDW: 13.7 % (ref 11.0–15.0)
Total Lymphocyte: 44.7 %
WBC: 7 10*3/uL (ref 3.8–10.8)

## 2022-10-07 NOTE — Progress Notes (Signed)
CBC and CMP normal

## 2022-10-25 ENCOUNTER — Ambulatory Visit: Payer: Commercial Managed Care - PPO | Admitting: Internal Medicine

## 2022-10-25 ENCOUNTER — Ambulatory Visit: Payer: BC Managed Care – PPO | Admitting: Rheumatology

## 2022-11-18 NOTE — Progress Notes (Signed)
Office Visit Note  Patient: Erica Fox             Date of Birth: 03-31-1985           MRN: [id number]             PCP: Avanell Shackleton, NP-C Referring: Avanell Shackleton, NP-C Visit Date: 11/19/2022 Occupation: @GUAROCC @  Subjective:  Pain in multiple joints   History of Present Illness: Babita Liebert is a 37 y.o. female with history of rheumatoid arthritis.  Patient remains on otrexup 20 mg sq injections once weekly, folic acid 2 mg p.o. daily, and Simponi 50 mg sq injections monthly.  Patient was started on Simponi on 10/05/22.  She is tolerating both injections without any side effects or injection site reactions.  She has not yet noticed any clinical improvement in her symptoms.  She continues to have recurrent flares.  She presents today with pain and inflammation in both knees and the left ankle joint.  She is also having increased pain in her right thumb.  Patient states that her symptoms were better controlled while taking prednisone but have returned since discontinuing the taper 2 to 3 weeks ago.   Activities of Daily Living:  Patient reports joint stiffness all day   Patient Reports nocturnal pain.  Difficulty dressing/grooming: Reports Difficulty climbing stairs: Reports Difficulty getting out of chair: Reports Difficulty using hands for taps, buttons, cutlery, and/or writing: Reports  Review of Systems  Constitutional:  Positive for fatigue.  HENT:  Negative for mouth sores and mouth dryness.   Eyes:  Negative for dryness.  Respiratory:  Negative for shortness of breath.   Cardiovascular:  Negative for chest pain and palpitations.  Gastrointestinal:  Negative for blood in stool, constipation and diarrhea.  Endocrine: Negative for increased urination.  Genitourinary:  Negative for involuntary urination.  Musculoskeletal:  Positive for joint pain, joint pain, joint swelling, myalgias, muscle weakness, morning stiffness, muscle tenderness and myalgias. Negative for gait  problem.  Skin:  Negative for color change, rash, hair loss and sensitivity to sunlight.  Allergic/Immunologic: Negative for susceptible to infections.  Neurological:  Positive for headaches. Negative for dizziness.  Hematological:  Negative for swollen glands.  Psychiatric/Behavioral:  Negative for depressed mood and sleep disturbance. The patient is nervous/anxious.     PMFS History:  Patient Active Problem List   Diagnosis Date Noted   Seasonal allergies 08/26/2022   Rheumatoid arthritis of multiple sites with negative rheumatoid factor (HCC) 07/22/2022    Past Medical History:  Diagnosis Date   Anemia    Degenerative joint disease     Family History  Problem Relation Age of Onset   Heart disease Mother    Breast cancer Paternal Aunt    Healthy Son    Healthy Son    Past Surgical History:  Procedure Laterality Date   CESAREAN SECTION     2007, 2012   EYE SURGERY Right    age 19, tighten muscle for lazy eye   FOOT SURGERY Left    hammertoe correction & screws placed   FOOT SURGERY Right 05/07/2022   hammertoe correction   TUBAL LIGATION     WISDOM TOOTH EXTRACTION  2014   Social History   Social History Narrative   Not on file    There is no immunization history on file for this patient.   Objective: Vital Signs: BP 115/74 (BP Location: Left Arm, Patient Position: Sitting, Cuff Size: Normal)   Pulse 73   Resp  14   Ht 5\' 8"  (1.727 m)   Wt 275 lb (124.7 kg)   BMI 41.81 kg/m    Physical Exam Vitals and nursing note reviewed.  Constitutional:      Appearance: She is well-developed.  HENT:     Head: Normocephalic and atraumatic.  Eyes:     Conjunctiva/sclera: Conjunctivae normal.  Cardiovascular:     Rate and Rhythm: Normal rate and regular rhythm.     Heart sounds: Normal heart sounds.  Pulmonary:     Effort: Pulmonary effort is normal.     Breath sounds: Normal breath sounds.  Abdominal:     General: Bowel sounds are normal.     Palpations:  Abdomen is soft.  Musculoskeletal:     Cervical back: Normal range of motion.  Lymphadenopathy:     Cervical: No cervical adenopathy.  Skin:    General: Skin is warm and dry.     Capillary Refill: Capillary refill takes less than 2 seconds.  Neurological:     Mental Status: She is alert and oriented to person, place, and time.  Psychiatric:        Behavior: Behavior normal.      Musculoskeletal Exam: C-spine has limited range of motion with lateral rotation.  Shoulder joints have good range of motion.  Elbow joints have good range of motion with no tenderness or inflammation.  Both wrist joints are fused with minimal extension and flexion.  Synovial thickening over MCP and PIP joints.  Incomplete fist formation noted.  Hip joints have good range of motion with no groin pain.  Painful range of motion of both knee joints with crepitus bilaterally.  Warmth and swelling noted in the left knee.  Warmth of the right knee noted.  Tenderness and synovitis of the left ankle.  CDAI Exam: CDAI Score: 15  Patient Global: 60 / 100; Provider Global: 60 / 100 Swollen: 2 ; Tender: 3  Joint Exam 11/19/2022      Right  Left  Knee   Tender  Swollen Tender  Ankle     Swollen Tender     Investigation: No additional findings.  Imaging: DG Foot Complete Right  Result Date: 11/10/2022 Please see detailed radiograph report in office note.   Recent Labs: Lab Results  Component Value Date   WBC 7.0 10/06/2022   HGB 12.7 10/06/2022   PLT 201 10/06/2022   NA 139 10/06/2022   K 3.7 10/06/2022   CL 107 10/06/2022   CO2 25 10/06/2022   GLUCOSE 89 10/06/2022   BUN 9 10/06/2022   CREATININE 0.66 10/06/2022   BILITOT 0.4 10/06/2022   ALKPHOS 66 03/11/2022   AST 11 10/06/2022   ALT 9 10/06/2022   PROT 6.9 10/06/2022   ALBUMIN 4.8 03/11/2022   CALCIUM 8.8 10/06/2022   QFTBGOLDPLUS NEGATIVE 07/22/2022    Speciality Comments: Enbrel-discontinued Humira-inadequate response Simponi started October 05, 2022 Methotrexate started August 26, 2022  Procedures:  No procedures performed Allergies: Penicillins     Assessment / Plan:     Visit Diagnoses: Rheumatoid arthritis of multiple sites with negative rheumatoid factor (HCC) - RF negative, anti-CCP negative, MCV negative, elevated sedimentation rate. JIA at age 37.  With severe erosive rheumatoid arthritis: Patient presents today experiencing a flare involving both knees and the left ankle.  She has painful range of motion of both knee joints with warmth in the right knee and swelling in her left knee.  She has tenderness and synovitis of the left ankle  joint on examination.  She has been having difficulty ambulating especially walking on uneven terrain for work due to the severity of her symptoms. Patient has been on Otrexup for 3-1/2 months and initiated Simponi on 10/05/2022.  She has been tolerating both injections without any side effects or injection site reactions.  She has not had any recent or recurrent infections.  She has not yet noticed any clinical improvement since initiating either medication.  She completed a prednisone taper 2 to 3 weeks ago and her symptoms have recurred.  She has only had a total of 2 doses of Simponi thus far.  She is willing to give Simponi more time and for [place] to reassess for the full efficacy at her follow-up visit.   A prednisone taper starting at 20 mg tapering by 5 mg every week with sent to the pharmacy today.  Instructions were provided including taking prednisone first thing in the morning with food. Plan to try increasing the dose of Otrexup from 20 mg to 25 mg sq injections once weekly pending labs today.  She will then require repeat CBC and CMP 1 month and every 3 months. If she continues to have ongoing pain and inflammation involving multiple joints we will discuss different treatment options at her follow-up visit in 6 weeks.  High risk medication use - Otrexup 25 mg sq injections once weekly, folic  acid 2 mg p.o. daily, Simponi 50 mg sq injections monthly.  (Inadequate response to Humira, Enbrel discontinued).  Simply initiated on 10/05/2022. CBC and CMP WNL on 10/06/22.  Orders for CBC and CMP released today.  Plan to update lab work in 1 month and every 3 months after increasing the dose of Otrexup to 25 mg subcutaneous injections once weekly. TB gold negative 07/22/22 Discussed the importance of holding otrexup and simponi if she develops signs or symptoms of an infection and to resume once the infection has completely cleared.  - Plan: CBC with Differential/Platelet, COMPLETE METABOLIC PANEL WITH GFR  Pain in both hands: Limited range of motion of both wrist joints-both wrists are fused.  She is having increased discomfort in her right thumb currently.  Chronic pain of both knees: Patient presents today with pain and inflammation involving both knee joints.  Swelling was noted in the left knee joint today.  She had a left knee joint cortisone injection on 08/26/2022.  Patient completed a prednisone taper 2 to 3 weeks ago and has had a recurrence of symptoms.  She has had a total of 2 doses of Simponi thus far and has been on Otrexup for 3-1/2 months. Plan to give Simponi more time and to reassess for the full efficacy at her follow-up visit.  Plan on trying to increase the dose of Otrexup to 25 mg subcu injections once weekly with close lab monitoring.  Effusion, left knee - Injected with cortisone 08/26/22.  She has had a recurrence of left knee joint pain and swelling.  A prednisone taper was sent to the pharmacy today as discussed above.  Pain in both feet: Patient presents today with tenderness and synovitis of the left ankle joint.  No recent injury or fall.  She has difficulty ambulating for prolonged distances as well as when on uneven terrain.  A prednisone taper was sent to the pharmacy today.  She is willing to give Simponi injections more time and for [place] to reassess for the full efficacy.   Plan on trying to increase the dose of Otrexup from 20 mg  to 25 mg once weekly pending lab results today.   JIA (juvenile idiopathic arthritis) (HCC) - Diagnosed at age 70.  Treated with different DMARDs and Biologics until age 3.  She stopped methotrexate and Enbrel at age 37.  Elevated sed rate: ESR was 46 on 03/11/2022.  Other fatigue: She has been experiencing ongoing fatigue on a daily basis.  Vitamin D deficiency -vitamin D level be checked today.  Plan: VITAMIN D 25 Hydroxy (Vit-D Deficiency, Fractures)  Other medical conditions are listed as follows:   IgA deficiency (HCC)  Seasonal allergies   Orders: Orders Placed This Encounter  Procedures   CBC with Differential/Platelet   COMPLETE METABOLIC PANEL WITH GFR   VITAMIN D 25 Hydroxy (Vit-D Deficiency, Fractures)   Meds ordered this encounter  Medications   predniSONE (DELTASONE) 5 MG tablet    Sig: Take 4 tablets by mouth daily x1 week, 3 tablets daily x1wk, 2 tablets daily x1wk, 1 tablet daily x1wk.    Dispense:  70 tablet    Refill:  0     Follow-Up Instructions: Return in about 6 weeks (around 12/31/2022) for Rheumatoid arthritis.   Gearldine Bienenstock, PA-C  Note - This record has been created using Dragon software.  Chart creation errors have been sought, but may not always  have been located. Such creation errors do not reflect on  the standard of medical care.

## 2022-11-19 ENCOUNTER — Ambulatory Visit: Payer: Commercial Managed Care - PPO | Attending: Physician Assistant | Admitting: Physician Assistant

## 2022-11-19 ENCOUNTER — Encounter: Payer: Self-pay | Admitting: Physician Assistant

## 2022-11-19 VITALS — BP 115/74 | HR 73 | Resp 14 | Ht 68.0 in | Wt 275.0 lb

## 2022-11-19 DIAGNOSIS — M25561 Pain in right knee: Secondary | ICD-10-CM

## 2022-11-19 DIAGNOSIS — R5383 Other fatigue: Secondary | ICD-10-CM

## 2022-11-19 DIAGNOSIS — M79642 Pain in left hand: Secondary | ICD-10-CM

## 2022-11-19 DIAGNOSIS — M0880 Other juvenile arthritis, unspecified site: Secondary | ICD-10-CM

## 2022-11-19 DIAGNOSIS — G8929 Other chronic pain: Secondary | ICD-10-CM

## 2022-11-19 DIAGNOSIS — M79641 Pain in right hand: Secondary | ICD-10-CM | POA: Diagnosis not present

## 2022-11-19 DIAGNOSIS — D802 Selective deficiency of immunoglobulin A [IgA]: Secondary | ICD-10-CM

## 2022-11-19 DIAGNOSIS — Z79899 Other long term (current) drug therapy: Secondary | ICD-10-CM

## 2022-11-19 DIAGNOSIS — M79672 Pain in left foot: Secondary | ICD-10-CM

## 2022-11-19 DIAGNOSIS — M79671 Pain in right foot: Secondary | ICD-10-CM

## 2022-11-19 DIAGNOSIS — J302 Other seasonal allergic rhinitis: Secondary | ICD-10-CM

## 2022-11-19 DIAGNOSIS — E559 Vitamin D deficiency, unspecified: Secondary | ICD-10-CM

## 2022-11-19 DIAGNOSIS — R700 Elevated erythrocyte sedimentation rate: Secondary | ICD-10-CM

## 2022-11-19 DIAGNOSIS — M25562 Pain in left knee: Secondary | ICD-10-CM

## 2022-11-19 DIAGNOSIS — M0609 Rheumatoid arthritis without rheumatoid factor, multiple sites: Secondary | ICD-10-CM

## 2022-11-19 DIAGNOSIS — M25462 Effusion, left knee: Secondary | ICD-10-CM

## 2022-11-19 MED ORDER — PREDNISONE 5 MG PO TABS: ORAL_TABLET | ORAL | 0 refills | Status: DC

## 2022-11-19 NOTE — Patient Instructions (Signed)
Standing Labs We placed an order today for your standing lab work.   Please have your standing labs drawn in 1 month and then every 3 months.   Please have your labs drawn 2 weeks prior to your appointment so that the provider can discuss your lab results at your appointment, if possible.  Please note that you may see your imaging and lab results in MyChart before we have reviewed them. We will contact you once all results are reviewed. Please allow our office up to 72 hours to thoroughly review all of the results before contacting the office for clarification of your results.  WALK-IN LAB HOURS  Monday through Thursday from 8:00 am -12:30 pm and 1:00 pm-5:00 pm and Friday from 8:00 am-12:00 pm.  Patients with office visits requiring labs will be seen before walk-in labs.  You may encounter longer than normal wait times. Please allow additional time. Wait times may be shorter on  Monday and Thursday afternoons.  We do not book appointments for walk-in labs. We appreciate your patience and understanding with our staff.   Labs are drawn by [facility]. Please bring your co-pay at the time of your lab draw.  You may receive a bill from [facility] for your lab work.  Please note if you are on Hydroxychloroquine and and an order has been placed for a Hydroxychloroquine level,  you will need to have it drawn 4 hours or more after your last dose.  If you wish to have your labs drawn at another location, please call the office 24 hours in advance so we can fax the orders.  The office is located at [address], [place], [place] [address]   If you have any questions regarding directions or hours of operation,  please call [phone].   As a reminder, please drink plenty of water prior to coming for your lab work. Thanks!

## 2022-11-20 LAB — COMPLETE METABOLIC PANEL WITH GFR
AG Ratio: 1.5 (calc) (ref 1.0–2.5)
ALT: 16 U/L (ref 6–29)
AST: 17 U/L (ref 10–30)
Albumin: 4.4 g/dL (ref 3.6–5.1)
Alkaline phosphatase (APISO): 62 U/L (ref 31–125)
BUN: 8 mg/dL (ref 7–25)
CO2: 22 mmol/L (ref 20–32)
Calcium: 9.1 mg/dL (ref 8.6–10.2)
Chloride: 109 mmol/L (ref 98–110)
Creat: 0.72 mg/dL (ref 0.50–0.97)
Globulin: 3 g/dL (ref 1.9–3.7)
Glucose, Bld: 87 mg/dL (ref 65–99)
Potassium: 4 mmol/L (ref 3.5–5.3)
Sodium: 140 mmol/L (ref 135–146)
Total Bilirubin: 0.7 mg/dL (ref 0.2–1.2)
Total Protein: 7.4 g/dL (ref 6.1–8.1)
eGFR: 110 mL/min/{1.73_m2} (ref >=60)

## 2022-11-20 LAB — CBC WITH DIFFERENTIAL/PLATELET
Absolute Monocytes: 429 {cells}/uL (ref 200–950)
Basophils Absolute: 50 {cells}/uL (ref 0–200)
Basophils Relative: 0.9 %
Eosinophils Absolute: 242 {cells}/uL (ref 15–500)
Eosinophils Relative: 4.4 %
HCT: 41.1 % (ref 35.0–45.0)
Hemoglobin: 13.4 g/dL (ref 11.7–15.5)
Lymphs Abs: 2508 {cells}/uL (ref 850–3900)
MCH: 30.1 pg (ref 27.0–33.0)
MCHC: 32.6 g/dL (ref 32.0–36.0)
MCV: 92.4 fL (ref 80.0–100.0)
MPV: 10.6 fL (ref 7.5–12.5)
Monocytes Relative: 7.8 %
Neutro Abs: 2272 {cells}/uL (ref 1500–7800)
Neutrophils Relative %: 41.3 %
Platelets: 182 10*3/uL (ref 140–400)
RBC: 4.45 10*6/uL (ref 3.80–5.10)
RDW: 14.5 % (ref 11.0–15.0)
Total Lymphocyte: 45.6 %
WBC: 5.5 10*3/uL (ref 3.8–10.8)

## 2022-11-20 LAB — VITAMIN D 25 HYDROXY (VIT D DEFICIENCY, FRACTURES): Vit D, 25-Hydroxy: 17 ng/mL — ABNORMAL LOW (ref 30–100)

## 2022-11-23 NOTE — Progress Notes (Deleted)
Office Visit Note  Patient: Erica Fox             Date of Birth: [birth year]           MRN: [id number]             PCP: Avanell Shackleton, NP-C Referring: Avanell Shackleton, NP-C Visit Date: 12/07/2022 Occupation: @GUAROCC @  Subjective:  No chief complaint on file.   History of Present Illness: Erica Fox is a 37 y.o. female ***     Activities of Daily Living:  Patient reports morning stiffness for *** {minute/hour:19697}.   Patient {ACTIONS;DENIES/REPORTS:21021675::"Denies"} nocturnal pain.  Difficulty dressing/grooming: {ACTIONS;DENIES/REPORTS:21021675::"Denies"} Difficulty climbing stairs: {ACTIONS;DENIES/REPORTS:21021675::"Denies"} Difficulty getting out of chair: {ACTIONS;DENIES/REPORTS:21021675::"Denies"} Difficulty using hands for taps, buttons, cutlery, and/or writing: {ACTIONS;DENIES/REPORTS:21021675::"Denies"}  No Rheumatology ROS completed.   PMFS History:  Patient Active Problem List   Diagnosis Date Noted   Seasonal allergies 08/26/2022   Rheumatoid arthritis of multiple sites with negative rheumatoid factor (HCC) 07/22/2022    Past Medical History:  Diagnosis Date   Anemia    Degenerative joint disease     Family History  Problem Relation Age of Onset   Heart disease Mother    Breast cancer Paternal Aunt    Healthy Son    Healthy Son    Past Surgical History:  Procedure Laterality Date   CESAREAN SECTION     2007, 2012   EYE SURGERY Right    age 17, tighten muscle for lazy eye   FOOT SURGERY Left    hammertoe correction & screws placed   FOOT SURGERY Right 05/07/2022   hammertoe correction   TUBAL LIGATION     WISDOM TOOTH EXTRACTION  2014   Social History   Social History Narrative   Not on file    There is no immunization history on file for this patient.   Objective: Vital Signs: There were no vitals taken for this visit.   Physical Exam   Musculoskeletal Exam: ***  CDAI Exam: CDAI Score: -- Patient Global: --; Provider  Global: -- Swollen: --; Tender: -- Joint Exam 12/07/2022   No joint exam has been documented for this visit   There is currently no information documented on the homunculus. Go to the Rheumatology activity and complete the homunculus joint exam.  Investigation: No additional findings.  Imaging: DG Foot Complete Right  Result Date: 11/10/2022 Please see detailed radiograph report in office note.   Recent Labs: Lab Results  Component Value Date   WBC 5.5 11/19/2022   HGB 13.4 11/19/2022   PLT 182 11/19/2022   NA 140 11/19/2022   K 4.0 11/19/2022   CL 109 11/19/2022   CO2 22 11/19/2022   GLUCOSE 87 11/19/2022   BUN 8 11/19/2022   CREATININE 0.72 11/19/2022   BILITOT 0.7 11/19/2022   ALKPHOS 66 03/11/2022   AST 17 11/19/2022   ALT 16 11/19/2022   PROT 7.4 11/19/2022   ALBUMIN 4.8 03/11/2022   CALCIUM 9.1 11/19/2022   QFTBGOLDPLUS NEGATIVE 07/22/2022    Speciality Comments: Enbrel-discontinued Humira-inadequate response Simponi started October 05, 2022 Methotrexate started August 26, 2022  Procedures:  No procedures performed Allergies: Penicillins   Assessment / Plan:     Visit Diagnoses: Rheumatoid arthritis of multiple sites with negative rheumatoid factor (HCC)  High risk medication use  Pain in both hands  Chronic pain of both knees  Effusion, left knee  Pain in both feet  JIA (juvenile idiopathic arthritis) (HCC)  Elevated sed rate  IgA  deficiency (HCC)  Seasonal allergies  Other fatigue  Vitamin D deficiency  Orders: No orders of the defined types were placed in this encounter.  No orders of the defined types were placed in this encounter.   Face-to-face time spent with patient was *** minutes. Greater than 50% of time was spent in counseling and coordination of care.  Follow-Up Instructions: No follow-ups on file.   Gearldine Bienenstock, PA-C  Note - This record has been created using Dragon software.  Chart creation errors have been  sought, but may not always  have been located. Such creation errors do not reflect on  the standard of medical care.

## 2022-11-23 NOTE — Progress Notes (Signed)
CBC and CMP WNL  Vitamin D low.  Please notify the patient and send in vitamin D 50,000 units once weekly x3 months.  Recheck vitamin D in 3 months.

## 2022-11-24 ENCOUNTER — Other Ambulatory Visit: Payer: Self-pay | Admitting: *Deleted

## 2022-11-24 DIAGNOSIS — E559 Vitamin D deficiency, unspecified: Secondary | ICD-10-CM

## 2022-11-24 MED ORDER — VITAMIN D (ERGOCALCIFEROL) 1.25 MG (50000 UNIT) PO CAPS
50000.0000 [IU] | ORAL_CAPSULE | ORAL | 0 refills | Status: DC
Start: 2022-11-24 — End: 2023-03-30

## 2022-11-24 NOTE — Telephone Encounter (Signed)
-----   Message from Gearldine Bienenstock sent at 11/23/2022  7:40 AM EDT ----- CBC and CMP WNL  Vitamin D low.  Please notify the patient and send in vitamin D 50,000 units once weekly x3 months.  Recheck vitamin D in 3 months.

## 2022-11-30 ENCOUNTER — Telehealth: Payer: Self-pay | Admitting: Pharmacist

## 2022-11-30 DIAGNOSIS — Z79899 Other long term (current) drug therapy: Secondary | ICD-10-CM

## 2022-11-30 DIAGNOSIS — M0609 Rheumatoid arthritis without rheumatoid factor, multiple sites: Secondary | ICD-10-CM

## 2022-11-30 NOTE — Telephone Encounter (Signed)
Pt left VM stating patent now is insured under her employer's plan as primary. Please run BIV for SIMPONI SQ and OTREXUP    Chesley Mires, PharmD, MPH, BCPS, CPP Clinical Pharmacist (Rheumatology and Pulmonology)

## 2022-12-03 ENCOUNTER — Other Ambulatory Visit (HOSPITAL_COMMUNITY): Payer: Self-pay

## 2022-12-03 MED ORDER — GOLIMUMAB 50 MG/0.5ML ~~LOC~~ SOAJ
50.0000 mg | SUBCUTANEOUS | 2 refills | Status: DC
Start: 2022-12-03 — End: 2022-12-16

## 2022-12-03 MED ORDER — OTREXUP 25 MG/0.4ML ~~LOC~~ SOAJ
25.0000 mg | SUBCUTANEOUS | 2 refills | Status: DC
Start: 2022-12-03 — End: 2022-12-24

## 2022-12-03 NOTE — Telephone Encounter (Signed)
Submitted a Prior Authorization request to [employer] for SIMPONI SQ via CoverMyMeds. Will update once we receive a response.  Key: [id number]  Submitted a Prior Authorization request to [employer] for OTREXUP via CoverMyMeds. Will update once we receive a response.  Key: Raynelle Bring, PharmD, MPH, BCPS, CPP Clinical Pharmacist (Rheumatology and Pulmonology)

## 2022-12-03 NOTE — Telephone Encounter (Signed)
Received notification from [employer] regarding a prior authorization for [facility]. Authorization has been APPROVED from 12/03/22 to 12/03/2023. Authorization # [id number]  Received notification from [employer] regarding a prior authorization for OTREXUP. Authorization has been APPROVED from 12/03/22 to 12/03/23. Authorization # [id number]  Unable to run test claims because patient must fill through [facility]: [phone]  ATC pt to discuss. Unable to reach. Left VM w pharmacy phone number. MyChart message sent  Chesley Mires, PharmD, MPH, BCPS, CPP Clinical Pharmacist (Rheumatology and Pulmonology)

## 2022-12-07 ENCOUNTER — Ambulatory Visit: Payer: Commercial Managed Care - PPO | Admitting: Physician Assistant

## 2022-12-13 ENCOUNTER — Telehealth: Payer: Self-pay | Admitting: Pharmacist

## 2022-12-13 NOTE — Telephone Encounter (Signed)
Received notification from [employer] regarding a prior authorization for OTREXUP. Authorization has been APPROVED from 12/13/22 to 12/13/23. Approval letter sent to scan center.  Patient must fill through [facility]: [phone]  Authorization # [id number]

## 2022-12-13 NOTE — Telephone Encounter (Signed)
Received fax from [place]. [place] for Otrexup 25mg  strength requires separate PA. Form completed and faxed  Case # [id number] Fax: [phone] Phone: [phone]  Chesley Mires, PharmD, MPH, BCPS, CPP Clinical Pharmacist (Rheumatology and Pulmonology)

## 2022-12-16 ENCOUNTER — Encounter: Payer: Self-pay | Admitting: Rheumatology

## 2022-12-16 DIAGNOSIS — M0609 Rheumatoid arthritis without rheumatoid factor, multiple sites: Secondary | ICD-10-CM

## 2022-12-16 DIAGNOSIS — Z79899 Other long term (current) drug therapy: Secondary | ICD-10-CM

## 2022-12-16 MED ORDER — GOLIMUMAB 50 MG/0.5ML ~~LOC~~ SOAJ
50.0000 mg | SUBCUTANEOUS | 2 refills | Status: DC
Start: 2022-12-16 — End: 2022-12-24

## 2022-12-16 NOTE — Addendum Note (Signed)
Addended by: Murrell Redden on: 12/16/2022 09:10 AM   Modules accepted: Orders

## 2022-12-17 MED ORDER — METHOTREXATE SODIUM 2.5 MG PO TABS
25.0000 mg | ORAL_TABLET | ORAL | 0 refills | Status: DC
Start: 2022-12-17 — End: 2023-03-30

## 2022-12-17 NOTE — Progress Notes (Unsigned)
Rx for methotrexate tabs sent to local [facility] until patient can acquire Otrexup from [facility]. Pt notified via MyChart  Chesley Mires, PharmD, MPH, BCPS, CPP Clinical Pharmacist (Rheumatology and Pulmonology)

## 2022-12-17 NOTE — Telephone Encounter (Signed)
Responded to this concern in more recent MyChart message from 12/16/22

## 2022-12-21 NOTE — Progress Notes (Unsigned)
Office Visit Note  Patient: Erica Fox             Date of Birth: 1986-01-03           MRN: [id number]             PCP: Avanell Shackleton, NP-C Referring: Avanell Shackleton, NP-C Visit Date: 01/04/2023 Occupation: @GUAROCC @  Subjective:  Medication monitoring   History of Present Illness: Zira Yoho is a 37 y.o. female with history of seronegative rheumatoid arthritis.  Patient is currently taking Otrexup 25 mg sq inj weekly, folic acid 2 mg daily, and Simponi 50 mg sq injections monthly. Patient was started on Simponi on 10/05/22.  Patient is due for her fourth dose of Simponi tomorrow.  Patient denies any clinical improvement since initiating Simponi.  She continues to have recurrent flares involving her knees and ankle joints.  She has difficulty ambulating and performing ADLs during these flares.  While taking prednisone her mobility improves but does not resolve her pain.  She is currently taking prednisone 15 mg daily and is following the prednisone taper as prescribed.  She is open to discuss other treatment options since she has not noticed clinical improvement while on Simponi. She denies any recent or recurrent infections.   Activities of Daily Living:  Patient reports joint stiffness all day  Patient Reports nocturnal pain.  Difficulty dressing/grooming: Reports Difficulty climbing stairs: Reports Difficulty getting out of chair: Reports Difficulty using hands for taps, buttons, cutlery, and/or writing: Reports  Review of Systems  Constitutional:  Positive for fatigue.  HENT:  Negative for mouth sores and mouth dryness.   Eyes:  Negative for dryness.  Respiratory:  Negative for shortness of breath.   Cardiovascular:  Negative for chest pain and palpitations.  Gastrointestinal:  Negative for blood in stool, constipation and diarrhea.  Endocrine: Negative for increased urination.  Genitourinary:  Negative for involuntary urination.  Musculoskeletal:  Positive for joint  pain, joint pain, joint swelling, myalgias, muscle weakness, morning stiffness, muscle tenderness and myalgias. Negative for gait problem.  Skin:  Negative for color change, rash, hair loss and sensitivity to sunlight.  Allergic/Immunologic: Negative for susceptible to infections.  Neurological:  Positive for headaches. Negative for dizziness.  Hematological:  Negative for swollen glands.  Psychiatric/Behavioral:  Positive for depressed mood. Negative for sleep disturbance. The patient is nervous/anxious.     PMFS History:  Patient Active Problem List   Diagnosis Date Noted   Seasonal allergies 08/26/2022   Rheumatoid arthritis of multiple sites with negative rheumatoid factor (HCC) 07/22/2022    Past Medical History:  Diagnosis Date   Anemia    Degenerative joint disease     Family History  Problem Relation Age of Onset   Heart disease Mother    Breast cancer Paternal Aunt    Healthy Son    Healthy Son    Past Surgical History:  Procedure Laterality Date   CESAREAN SECTION     2007, 2012   EYE SURGERY Right    age 65, tighten muscle for lazy eye   FOOT SURGERY Left    hammertoe correction & screws placed   FOOT SURGERY Right 05/07/2022   hammertoe correction   TUBAL LIGATION     WISDOM TOOTH EXTRACTION  2014   Social History   Social History Narrative   Not on file    There is no immunization history on file for this patient.   Objective: Vital Signs: BP 134/81 (BP Location: Left Arm,  Patient Position: Sitting, Cuff Size: Normal)   Pulse 60   Resp 14   Ht 5\' 8"  (1.727 m)   Wt 277 lb (125.6 kg)   BMI 42.12 kg/m    Physical Exam Vitals and nursing note reviewed.  Constitutional:      Appearance: She is well-developed.  HENT:     Head: Normocephalic and atraumatic.  Eyes:     Conjunctiva/sclera: Conjunctivae normal.  Cardiovascular:     Rate and Rhythm: Normal rate and regular rhythm.     Heart sounds: Normal heart sounds.  Pulmonary:     Effort:  Pulmonary effort is normal.     Breath sounds: Normal breath sounds.  Abdominal:     General: Bowel sounds are normal.     Palpations: Abdomen is soft.  Musculoskeletal:     Cervical back: Normal range of motion.  Lymphadenopathy:     Cervical: No cervical adenopathy.  Skin:    General: Skin is warm and dry.     Capillary Refill: Capillary refill takes less than 2 seconds.  Neurological:     Mental Status: She is alert and oriented to person, place, and time.  Psychiatric:        Behavior: Behavior normal.      Musculoskeletal Exam: C-spine has limited range of motion with lateral rotation.  Painful limited mobility of the lumbar spine.  Shoulder joints and elbow joints have good range of motion.  Both wrist joints are fused due to underlying rheumatoid arthritis.  Tenderness palpation of both wrist joints.  Synovial thickening of all MCP joints.  Synovial thickening of PIP joints.  Hip joints have good range of motion with no groin pain.  Painful range of motion of both knee joints.  Warmth and swelling of the left knee noted.  Tenderness of the left ankle.  Limited range of motion of the left ankle joint.  CDAI Exam: CDAI Score: 14  Patient Global: 60 / 100; Provider Global: 60 / 100 Swollen: 1 ; Tender: 2  Joint Exam 01/04/2023      Right  Left  Knee     Swollen Tender  Ankle      Tender     Investigation: No additional findings.  Imaging: No results found.  Recent Labs: Lab Results  Component Value Date   WBC 5.5 11/19/2022   HGB 13.4 11/19/2022   PLT 182 11/19/2022   NA 140 11/19/2022   K 4.0 11/19/2022   CL 109 11/19/2022   CO2 22 11/19/2022   GLUCOSE 87 11/19/2022   BUN 8 11/19/2022   CREATININE 0.72 11/19/2022   BILITOT 0.7 11/19/2022   ALKPHOS 66 03/11/2022   AST 17 11/19/2022   ALT 16 11/19/2022   PROT 7.4 11/19/2022   ALBUMIN 4.8 03/11/2022   CALCIUM 9.1 11/19/2022   QFTBGOLDPLUS NEGATIVE 07/22/2022    Speciality Comments:  Enbrel-discontinued Humira-inadequate response Simponi started October 05, 2022 Methotrexate started August 26, 2022  Procedures:  No procedures performed Allergies: Penicillins   Assessment / Plan:     Visit Diagnoses: Rheumatoid arthritis of multiple sites with negative rheumatoid factor (HCC) - RF negative, anti-CCP negative, MCV negative, elevated sedimentation rate. JIA at age 69.  With severe erosive rheumatoid arthritis: Patient continues to experience recurrent flares despite being on combination therapy.  She was initiated on Simponi subcutaneous injections on 10/05/2022--due for the 4th dose tomorrow.  She is also on Rasuvo 25 mg sq injections once weekly and folic acid 2 mg daily.  She  has been tolerating combination therapy without any side effects or injection site reactions.  She has not noticed any clinical benefit since initiating Simponi. Patient was prescribed a prednisone taper on 08/26/2022, 10/06/2022, 11/19/2022, and 12/28/2022.  She is currently on prednisone 15 mg daily.  Different treatment options were discussed today in detail.  Plan to discontinue Simponi and will be switching to Rinvoq 15 mg 1 tablet by mouth daily.  Indications, contraindications, and potential side effects of Rinvoq were discussed today in detail.  All questions were addressed and consent was obtained.  She will remain on Rasuvo 25 mg sq injections once weekly along with folic acid 2 mg daily.  She will notify us if she cannot tolerate combination therapy.  Patient will follow-up in the office in 6 to 8 weeks to assess her response.   Counseled patient that Rinvoq is a JAK inhibitor indicated for Rheumatoid Arthritis.  Counseled patient on purpose, proper use, and adverse effects of Rinvoq.    Reviewed the most common adverse effects including infection, diarrhea, headaches.  Also reviewed rare adverse effects such as bowel injury and the need to contact us if they develop stomach pain during treatment. Counseled on  the increase risk of venous thrombosis. Counseled about FDA black box warning of MACE (major adverse CV events including cardiovascular death, myocardial infarction, and stroke).  Reviewed with patient that there is the possibility of an increased risk of malignancy specifically lung cancer and lymphomas but it is not well understood if this increased risk is due to the medication or the disease state. Instructed patient that medication should be held for infection and prior to surgery.  Advised patient to avoid live vaccines. Recommend annual influenza, PCV 15 or PCV20 or Pneumovax 23, and Shingrix as indicated.    Reviewed importance of routine lab monitoring including lipid panel.  Will recheck lipid panel 3 months after starting and annually thereafter. CBC and CMP will be monitored routinely every 3 months. Standing orders placed. Provided patient with medication education material and answered all questions.  Patient consented to Rinvoq.  Will upload into patient's chart.  Will apply through patient's insurance and update when we receive a response.    Patient dose will be 15 mg daily.  Prescription will be sent to pharmacy pending lab results and insurance approval.  High risk medication use - Plan to apply for Rinvoq 15 mg 1 tablet by mouth daily.  She will remain on Rasuvo 25 mg subcu days injections once weekly and folic acid 2 mg daily. Patient was started on Simponi on 10/05/22--patient plans to discontinue Simponi--she will not be injecting her fourth dose of Simponi scheduled for tomorrow. Inadequate response to Enbrel and Humira. CBC and CMP WNL on 11/19/22.  Orders for CBC and CMP released today.  Cardiac lab work will be due in 1 month and every 3 months. TB gold negative on 07/22/22.  No recent or recurrent infections.  Discussed the importance of holding otrexup and simponi if she develops signs or symptoms of an infection and to resume once the infection has completely cleared.  - Plan:  CBC with Differential/Platelet, COMPLETE METABOLIC PANEL WITH GFR  Pain in both hands: Limited extension of both wrist joints.  Synovial thickening of both wrists, MCPs, PIP joints.  Chronic pain of both knees: Patient continues to have chronic pain involving both knee joints.  She has difficulty rising from a seated position as well as ambulating due to severity of symptoms.  She has difficulty  rising from a seated position without assistance.  She is currently on prednisone 15 mg daily and has been following the prednisone taper as prescribed.  Prednisone helps to improve her mobility but she continues to have significant pain involving both knees. Plan to switch from Simponi to Rinvoq as discussed above.  She will remain on Rasuvo as prescribed.  Effusion, left knee - Injected with cortisone 08/26/22.  Patient continues to have recurrent flares.  Currently taking a prednisone taper.  Plan to switch from Simponi to Rinvoq due to no clinical improvement since initiating Simponi.  Pain in both feet: She continues to have ongoing pain in both ankle joints.  Tenderness over the left ankle was noted today.  Limited range of motion of the left ankle noted.  JIA (juvenile idiopathic arthritis) (HCC) - Diagnosed at age 70.  Treated with different DMARDs and Biologics until age 79.  She stopped methotrexate and Enbrel at age 60.  Elevated sed rate: History of elevated sed rate.  Patient continues to have recurrent flares.  She is currently on prednisone 15 mg daily and is following a prednisone taper as prescribed.  Other medical conditions are listed as follows:  IgA deficiency (HCC)  Seasonal allergies  Other fatigue  Vitamin D deficiency  Orders: Orders Placed This Encounter  Procedures   CBC with Differential/Platelet   COMPLETE METABOLIC PANEL WITH GFR   No orders of the defined types were placed in this encounter.    Follow-Up Instructions: Return in about 2 months (around 03/06/2023)  for Rheumatoid arthritis.   Gearldine Bienenstock, PA-C  Note - This record has been created using Dragon software.  Chart creation errors have been sought, but may not always  have been located. Such creation errors do not reflect on  the standard of medical care.

## 2022-12-24 ENCOUNTER — Telehealth: Payer: Self-pay | Admitting: Pharmacist

## 2022-12-24 ENCOUNTER — Other Ambulatory Visit (HOSPITAL_COMMUNITY): Payer: Self-pay

## 2022-12-24 DIAGNOSIS — Z79899 Other long term (current) drug therapy: Secondary | ICD-10-CM

## 2022-12-24 DIAGNOSIS — M0609 Rheumatoid arthritis without rheumatoid factor, multiple sites: Secondary | ICD-10-CM

## 2022-12-24 MED ORDER — GOLIMUMAB 50 MG/0.5ML ~~LOC~~ SOAJ
50.0000 mg | SUBCUTANEOUS | 1 refills | Status: DC
Start: 2022-12-24 — End: 2023-01-05

## 2022-12-24 MED ORDER — RASUVO 25 MG/0.5ML ~~LOC~~ SOAJ
25.0000 mg | SUBCUTANEOUS | 1 refills | Status: DC
Start: 2022-12-24 — End: 2023-02-28

## 2022-12-24 NOTE — Telephone Encounter (Signed)
Received notification from [facility] regarding a prior authorization for SIMPONI SQ. Authorization has been APPROVED from 12/24/2022 to 12/24/2023. Approval letter sent to scan center.  Unable to run test claim because patient must fill through [facility]: [phone]   Authorization # [id number]  MyChart message sent to pt  Chesley Mires, PharmD, MPH, BCPS, CPP Clinical Pharmacist (Rheumatology and Pulmonology)

## 2022-12-24 NOTE — Telephone Encounter (Signed)
Patient sent MyChart stating she has new insurance. She has uploaded copy of insurance card into media tab.  Submitted a Prior Authorization request to [facility] for SIMPONI SQ via CoverMyMeds. Will update once we receive a response.  Key: [id number]   Submitted a Prior Authorization request to [facility] for RASUVO 25mg  pens via Cover My Meds (because Rasuvo appears to not require PA per [facility]). Will update once we receive a response.  Key: [id number]   Per automated response from CMM: This medication or product is on your plan's list of covered drugs. Prior authorization is not required at this time. If your pharmacy has questions regarding the processing of your prescription, please have them call the [facility] pharmacy help desk at [phone].  Per test claim, copay for 28 days supply is $251.77.  Enrolled patient into Rasuvo copay card (unfortunately this only covers $125 per month): ID: [id number] BIN: [id number] PCN: [id number] Group: [id number]  Chesley Mires, PharmD, MPH, BCPS, CPP Clinical Pharmacist (Rheumatology and Pulmonology)

## 2022-12-27 ENCOUNTER — Other Ambulatory Visit: Payer: Self-pay | Admitting: Rheumatology

## 2022-12-27 DIAGNOSIS — M0609 Rheumatoid arthritis without rheumatoid factor, multiple sites: Secondary | ICD-10-CM

## 2022-12-27 DIAGNOSIS — Z79899 Other long term (current) drug therapy: Secondary | ICD-10-CM

## 2022-12-28 ENCOUNTER — Other Ambulatory Visit: Payer: Self-pay | Admitting: *Deleted

## 2022-12-28 MED ORDER — PREDNISONE 5 MG PO TABS: ORAL_TABLET | ORAL | 0 refills | Status: DC

## 2023-01-04 ENCOUNTER — Ambulatory Visit: Payer: Commercial Managed Care - PPO | Attending: Physician Assistant | Admitting: Physician Assistant

## 2023-01-04 ENCOUNTER — Encounter: Payer: Self-pay | Admitting: Physician Assistant

## 2023-01-04 VITALS — BP 134/81 | HR 60 | Resp 14 | Ht 68.0 in | Wt 277.0 lb

## 2023-01-04 DIAGNOSIS — J302 Other seasonal allergic rhinitis: Secondary | ICD-10-CM

## 2023-01-04 DIAGNOSIS — M25561 Pain in right knee: Secondary | ICD-10-CM | POA: Diagnosis not present

## 2023-01-04 DIAGNOSIS — M79642 Pain in left hand: Secondary | ICD-10-CM

## 2023-01-04 DIAGNOSIS — M79672 Pain in left foot: Secondary | ICD-10-CM

## 2023-01-04 DIAGNOSIS — M25562 Pain in left knee: Secondary | ICD-10-CM

## 2023-01-04 DIAGNOSIS — M79641 Pain in right hand: Secondary | ICD-10-CM | POA: Diagnosis not present

## 2023-01-04 DIAGNOSIS — Z79899 Other long term (current) drug therapy: Secondary | ICD-10-CM | POA: Diagnosis not present

## 2023-01-04 DIAGNOSIS — R700 Elevated erythrocyte sedimentation rate: Secondary | ICD-10-CM

## 2023-01-04 DIAGNOSIS — M0880 Other juvenile arthritis, unspecified site: Secondary | ICD-10-CM

## 2023-01-04 DIAGNOSIS — M0609 Rheumatoid arthritis without rheumatoid factor, multiple sites: Secondary | ICD-10-CM

## 2023-01-04 DIAGNOSIS — E559 Vitamin D deficiency, unspecified: Secondary | ICD-10-CM

## 2023-01-04 DIAGNOSIS — M79671 Pain in right foot: Secondary | ICD-10-CM

## 2023-01-04 DIAGNOSIS — M25462 Effusion, left knee: Secondary | ICD-10-CM

## 2023-01-04 DIAGNOSIS — R5383 Other fatigue: Secondary | ICD-10-CM

## 2023-01-04 DIAGNOSIS — G8929 Other chronic pain: Secondary | ICD-10-CM

## 2023-01-04 DIAGNOSIS — D802 Selective deficiency of immunoglobulin A [IgA]: Secondary | ICD-10-CM

## 2023-01-04 NOTE — Patient Instructions (Addendum)
Standing Labs We placed an order today for your standing lab work.   Please have your standing labs drawn in 1 month then every 3 months  Please have your labs drawn 2 weeks prior to your appointment so that the provider can discuss your lab results at your appointment, if possible.  Please note that you may see your imaging and lab results in MyChart before we have reviewed them. We will contact you once all results are reviewed. Please allow our office up to 72 hours to thoroughly review all of the results before contacting the office for clarification of your results.  WALK-IN LAB HOURS  Monday through Thursday from 8:00 am -12:30 pm and 1:00 pm-5:00 pm and Friday from 8:00 am-12:00 pm.  Patients with office visits requiring labs will be seen before walk-in labs.  You may encounter longer than normal wait times. Please allow additional time. Wait times may be shorter on  Monday and Thursday afternoons.  We do not book appointments for walk-in labs. We appreciate your patience and understanding with our staff.   Labs are drawn by [facility]. Please bring your co-pay at the time of your lab draw.  You may receive a bill from [facility] for your lab work.  Please note if you are on Hydroxychloroquine and and an order has been placed for a Hydroxychloroquine level,  you will need to have it drawn 4 hours or more after your last dose.  If you wish to have your labs drawn at another location, please call the office 24 hours in advance so we can fax the orders.  The office is located at [address], [place], [place] [address]   If you have any questions regarding directions or hours of operation,  please call [phone].   As a reminder, please drink plenty of water prior to coming for your lab work. Thanks!   Upadacitinib Extended-Release Tablets What is this medication? UPADACITINIB (ue PAD a SYE ti nib) treats autoimmune conditions, such as arthritis, eczema, and ulcerative  colitis. It is often used when other medications have not worked well enough or cannot be tolerated. It works by slowing down an overactive immune system. This decreases inflammation. This medicine may be used for other purposes; ask your health care provider or pharmacist if you have questions. COMMON BRAND NAME(S): RINVOQ What should I tell my care team before I take this medication? They need to know if you have any of these conditions: Blood clots Cancer Current or past tobacco use Diabetes Have had a heart attack or stroke Heart disease HIV or AIDs Immune system problems Infection or have had an infection that does not go away, such as tuberculosis (TB), shingles, or other bacterial, fungal, or viral infections Kidney disease Live or have traveled to the [facility] [place] or the [place] or [place] Liver disease, such as hepatitis Low blood cell levels (white cells, red cells, and platelets) Lung or breathing disease, such as asthma or COPD Recent or upcoming vaccine Stomach or intestine problems Taking NSAIDs, medications for pain and inflammation, such as ibuprofen or naproxen Taking steroid medications, such as prednisone or cortisone An unusual or allergic reaction to upadacitinib, other medications, foods, dyes, or preservatives Pregnant or trying to get pregnant Breastfeeding How should I use this medication? Take this medication by mouth with water. Take it as directed on the prescription label at the same time every day. Do not cut, crush, or chew this medication. Swallow the tablets whole. You  can take it with or without food. If it upsets your stomach, take it with food. Keep taking it unless your care team tells you to stop. Do not take this medication with grapefruit juice. A special MedGuide will be given to you by the pharmacist with each prescription and refill. Be sure to read this information carefully each time. Talk to your care team about the use of  this medication in children. While it may be prescribed for children as young as 2 years for selected conditions, precautions do apply. Overdosage: If you think you have taken too much of this medicine contact a poison control center or emergency room at once. NOTE: This medicine is only for you. Do not share this medicine with others. What if I miss a dose? If you miss a dose, take it as soon as you can. If it is almost time for your next dose, take only that dose. Do not take double or extra doses. What may interact with this medication? Certain antivirals for HIV or hepatitis Certain medications for fungal infections, such as ketoconazole, itraconazole, posaconazole, voriconazole Certain medications for seizures, such as carbamazepine, phenobarbital, phenytoin Clarithromycin Grapefruit and grapefruit juice Live virus vaccines Medications that lower your chance of fighting infection, such as abatacept, adalimumab, azathioprine, cyclosporine, etanercept, infliximab, rituximab Rifampin Supplements, such as St. John's wort Other medications may affect the way this medication works. Talk with your care team about all of the medications you take. They may suggest changes to your treatment plan to lower the risk of side effects and to make sure your medications work as intended. This list may not describe all possible interactions. Give your health care provider a list of all the medicines, herbs, non-prescription drugs, or dietary supplements you use. Also tell them if you smoke, drink alcohol, or use illegal drugs. Some items may interact with your medicine. What should I watch for while using this medication? Visit your care team for regular checks on your progress. Tell your care team if your symptoms do not start to get better or if they get worse. You may need blood work done while you are taking this medication. This medication may increase your risk of getting an infection. Call your care  team for advice if you get a fever, chills, sore throat, or other symptoms of a cold or flu. Do not treat yourself. Try to avoid being around people who are sick. Your care team will screen you for tuberculosis (TB) before you start this medication. If they think you are at risk, you may be treated with medication for TB. You should start taking the medication for TB before you start this medication. Make sure to finish the full course of TB medication. Avoid taking medications that contain aspirin, acetaminophen, ibuprofen, naproxen, or ketoprofen unless instructed by your care team. Talk to your care team about your risk of cancer. You may be more at risk for certain types of cancer if you take this medication. This medication can make you more sensitive to the sun. Keep out of the sun. If you cannot avoid being in the sun, wear protective clothing and sunscreen. Do not use sun lamps, tanning beds, or tanning booths. Tell your care team right away if you have any change in your eyesight. Talk to your care team if you often see part of the tablet in your stool. Talk to your care team if you may be pregnant. Serious birth defects can occur if you take this medication during  pregnancy and for 4 weeks after the last dose. You will need a negative pregnancy test before starting this medication. Contraception is recommended while taking this medication and for 4 weeks after the last dose. Your care team can help you find the option that works for you. Do not breastfeed while taking this medication and for 6 days after the last dose. What side effects may I notice from receiving this medication? Side effects that you should report to your care team as soon as possible: Allergic reactions--skin rash, itching, hives, swelling of the face, lips, tongue, or throat Blood clot--pain, swelling, or warmth in the leg, shortness of breath, chest pain Change in vision Heart attack--pain or tightness in the chest,  shoulders, arms, or jaw, nausea, shortness of breath, cold or clammy skin, feeling faint or lightheaded Infection--fever, chills, cough, sore throat, wounds that don't heal, pain or trouble when passing urine, general feeling of discomfort or being unwell Liver injury--right upper belly pain, loss of appetite, nausea, light-colored stool, dark yellow or brown urine, yellowing skin or eyes, unusual weakness or fatigue Low red blood cell level--unusual weakness or fatigue, dizziness, headache, trouble breathing Stomach pain that is severe, does not go away, or gets worse Stroke--sudden numbness or weakness of the face, arm, or leg, trouble speaking, confusion, trouble walking, loss of balance or coordination, dizziness, severe headache, change in vision Side effects that usually do not require medical attention (report these to your care team if they continue or are bothersome): Acne Cough Headache Nausea Runny or stuffy nose This list may not describe all possible side effects. Call your doctor for medical advice about side effects. You may report side effects to FDA at 1-800-FDA-1088. Where should I keep my medication? Keep out of the reach of children and pets. Store at room temperature between 20 and 25 degrees C (68 and 77 degrees F). Protect from moisture. Keep the container tightly closed. Keep this medication in the original container until you are ready to take it. Get rid of any unused medication after the expiration date. To get rid of medications that are no longer needed or have expired: Take the medication to a medication take-back program. Check with your pharmacy or law enforcement to find a location. If you cannot return the medication, check the label or package insert to see if the medication should be thrown out in the garbage or flushed down the toilet. If you are not sure, ask your care team. If it is safe to put it in the trash, empty the medication out of the container. Mix  the medication with cat litter, dirt, coffee grounds, or other unwanted substance. Seal the mixture in a bag or container. Put it in the trash. NOTE: This sheet is a summary. It may not cover all possible information. If you have questions about this medicine, talk to your doctor, pharmacist, or health care provider.  2024 Elsevier/Gold Standard (2022-07-26 00:00:00)

## 2023-01-05 ENCOUNTER — Encounter: Payer: Self-pay | Admitting: Pharmacist

## 2023-01-05 ENCOUNTER — Other Ambulatory Visit: Payer: Self-pay | Admitting: Pharmacist

## 2023-01-05 DIAGNOSIS — M0609 Rheumatoid arthritis without rheumatoid factor, multiple sites: Secondary | ICD-10-CM

## 2023-01-05 DIAGNOSIS — Z79899 Other long term (current) drug therapy: Secondary | ICD-10-CM

## 2023-01-05 LAB — CBC WITH DIFFERENTIAL/PLATELET
Absolute Lymphocytes: 3846 {cells}/uL (ref 850–3900)
Absolute Monocytes: 502 {cells}/uL (ref 200–950)
Basophils Absolute: 44 {cells}/uL (ref 0–200)
Basophils Relative: 0.5 %
Eosinophils Absolute: 246 {cells}/uL (ref 15–500)
Eosinophils Relative: 2.8 %
HCT: 39.9 % (ref 35.0–45.0)
Hemoglobin: 13 g/dL (ref 11.7–15.5)
MCH: 30.5 pg (ref 27.0–33.0)
MCHC: 32.6 g/dL (ref 32.0–36.0)
MCV: 93.7 fL (ref 80.0–100.0)
MPV: 10.7 fL (ref 7.5–12.5)
Monocytes Relative: 5.7 %
Neutro Abs: 4162 {cells}/uL (ref 1500–7800)
Neutrophils Relative %: 47.3 %
Platelets: 134 10*3/uL — ABNORMAL LOW (ref 140–400)
RBC: 4.26 10*6/uL (ref 3.80–5.10)
RDW: 13.3 % (ref 11.0–15.0)
Total Lymphocyte: 43.7 %
WBC: 8.8 10*3/uL (ref 3.8–10.8)

## 2023-01-05 LAB — COMPLETE METABOLIC PANEL WITH GFR
AG Ratio: 1.4 (calc) (ref 1.0–2.5)
ALT: 14 U/L (ref 6–29)
AST: 16 U/L (ref 10–30)
Albumin: 4.1 g/dL (ref 3.6–5.1)
Alkaline phosphatase (APISO): 60 U/L (ref 31–125)
BUN: 11 mg/dL (ref 7–25)
CO2: 23 mmol/L (ref 20–32)
Calcium: 8.9 mg/dL (ref 8.6–10.2)
Chloride: 107 mmol/L (ref 98–110)
Creat: 0.64 mg/dL (ref 0.50–0.97)
Globulin: 2.9 g/dL (ref 1.9–3.7)
Glucose, Bld: 86 mg/dL (ref 65–99)
Potassium: 3.7 mmol/L (ref 3.5–5.3)
Sodium: 138 mmol/L (ref 135–146)
Total Bilirubin: 0.6 mg/dL (ref 0.2–1.2)
Total Protein: 7 g/dL (ref 6.1–8.1)
eGFR: 116 mL/min/{1.73_m2} (ref >=60)

## 2023-01-05 MED ORDER — RINVOQ 15 MG PO TB24
15.0000 mg | ORAL_TABLET | Freq: Every day | ORAL | 0 refills | Status: DC
Start: 2023-01-05 — End: 2023-03-22

## 2023-01-05 NOTE — Progress Notes (Signed)
Patient is new start to Rinvoq. Submitted a Prior Authorization request to [facility] for [facility] via CoverMyMeds. Will update once we receive a response.  Key: [id number]   Chesley Mires, PharmD, MPH, BCPS, CPP Clinical Pharmacist (Rheumatology and Pulmonology)

## 2023-01-05 NOTE — Progress Notes (Signed)
Received notification from [facility] regarding a prior authorization for [facility]. Authorization has been APPROVED from 01/05/2023 to 01/05/2024. Approval letter sent to scan center.  Unable to run test claim because patient must fill through [facility]: [phone]   Authorization #  [id number] Phone # [phone]  Rinvoq copay card pending in [employer] portal. Rx sent to [facility] today. MyChart message sent to pt  Chesley Mires, PharmD, MPH, BCPS, CPP Clinical Pharmacist (Rheumatology and Pulmonology)

## 2023-01-05 NOTE — Progress Notes (Signed)
Platelet count is borderline low. Rest of CBC WNL.  CMP WNL.   Recommend rechecking CBC in 1 month.

## 2023-01-06 ENCOUNTER — Other Ambulatory Visit: Payer: Self-pay | Admitting: Rheumatology

## 2023-01-06 DIAGNOSIS — M0609 Rheumatoid arthritis without rheumatoid factor, multiple sites: Secondary | ICD-10-CM

## 2023-01-06 DIAGNOSIS — Z79899 Other long term (current) drug therapy: Secondary | ICD-10-CM

## 2023-01-07 NOTE — Progress Notes (Signed)
Rinvoq copay card still pending in [employer] portal

## 2023-01-09 ENCOUNTER — Other Ambulatory Visit: Payer: Self-pay | Admitting: Physician Assistant

## 2023-01-09 DIAGNOSIS — E559 Vitamin D deficiency, unspecified: Secondary | ICD-10-CM

## 2023-01-10 ENCOUNTER — Encounter: Payer: Self-pay | Admitting: Pharmacist

## 2023-01-10 NOTE — Progress Notes (Signed)
Initiated chat with [employer] insurance specialist. Rep able to enter patient into copay card: AV:[id number] Group: [id number] BIN: [id number] PCN: [id number]  MyChart message sent to patient with information above for her to provide to [facility]  Chesley Mires, PharmD, MPH, BCPS, CPP Clinical Pharmacist (Rheumatology and Pulmonology)

## 2023-01-12 ENCOUNTER — Telehealth: Payer: Self-pay | Admitting: Rheumatology

## 2023-01-13 ENCOUNTER — Other Ambulatory Visit: Payer: Self-pay | Admitting: Physician Assistant

## 2023-01-13 DIAGNOSIS — Z79899 Other long term (current) drug therapy: Secondary | ICD-10-CM

## 2023-01-13 DIAGNOSIS — M0609 Rheumatoid arthritis without rheumatoid factor, multiple sites: Secondary | ICD-10-CM

## 2023-01-17 ENCOUNTER — Other Ambulatory Visit: Payer: Self-pay | Admitting: Physician Assistant

## 2023-01-17 NOTE — Telephone Encounter (Signed)
I called patient, patient scheduled 01/17/2023 at 8:00 am.

## 2023-01-17 NOTE — Telephone Encounter (Signed)
If patient has an effusion she will likely require an aspiration-please schedule sooner visit with Dr. Corliss Skains.

## 2023-01-18 ENCOUNTER — Other Ambulatory Visit: Payer: Self-pay | Admitting: Family Medicine

## 2023-01-18 ENCOUNTER — Ambulatory Visit: Payer: Commercial Managed Care - PPO | Attending: Rheumatology | Admitting: Rheumatology

## 2023-01-18 VITALS — BP 126/50 | HR 77

## 2023-01-18 DIAGNOSIS — M25462 Effusion, left knee: Secondary | ICD-10-CM

## 2023-01-18 MED ORDER — TRIAMCINOLONE ACETONIDE 40 MG/ML IJ SUSP
60.0000 mg | INTRAMUSCULAR | Status: AC | PRN
  Administered 2023-01-18: 60 mg via INTRA_ARTICULAR

## 2023-01-18 MED ORDER — LIDOCAINE HCL 1 % IJ SOLN
5.0000 mL | INTRAMUSCULAR | Status: AC | PRN
  Administered 2023-01-18: 5 mL

## 2023-01-18 NOTE — Progress Notes (Signed)
Procedure Note  Patient: Erica Fox             Date of Birth: 07-05-1985           MRN: [id number]             Visit Date: 01/18/2023  Procedures: Visit Diagnoses:  1. Effusion, left knee     Large Joint Inj: L knee on 01/18/2023 8:10 AM Indications: pain Details: 27 G 1.5 in needle, medial approach  Arthrogram: No  Medications: 60 mg triamcinolone acetonide 40 MG/ML; 5 mL lidocaine 1 % Aspirate: 24.5 mL; sent for lab analysis Outcome: tolerated well, no immediate complications Procedure, treatment alternatives, risks and benefits explained, specific risks discussed. Consent was given by the patient. Immediately prior to procedure a time out was called to verify the correct patient, procedure, equipment, support staff and site/side marked as required. Patient was prepped and draped in the usual sterile fashion.     Postprocedure instructions were given.  Pollyann Savoy, MD

## 2023-01-19 NOTE — Progress Notes (Signed)
Synovial fluid is not inflammatory.  No crystals were seen.  No infection was noted.

## 2023-01-20 ENCOUNTER — Ambulatory Visit: Payer: Commercial Managed Care - PPO | Admitting: Family Medicine

## 2023-01-20 NOTE — Telephone Encounter (Signed)
Recommend evaluation by orthopedics to rule out a tear

## 2023-01-23 LAB — ANAEROBIC AND AEROBIC CULTURE
AER RESULT:: NO GROWTH
GRAM STAIN:: NONE SEEN
MICRO NUMBER:: 15657570
MICRO NUMBER:: 15657571
SPECIMEN QUALITY:: ADEQUATE
SPECIMEN QUALITY:: ADEQUATE

## 2023-01-23 LAB — SYNOVIAL FLUID ANALYSIS, COMPLETE
Basophils, %: 0 %
Eosinophils-Synovial: 0 % (ref 0–2)
Lymphocytes-Synovial Fld: 0 % (ref 0–74)
Monocyte/Macrophage: 98 % — ABNORMAL HIGH (ref 0–69)
Neutrophil, Synovial: 2 % (ref 0–24)
Synoviocytes, %: 0 % (ref 0–15)
WBC, Synovial: 221 {cells}/uL — ABNORMAL HIGH (ref <150)

## 2023-01-29 ENCOUNTER — Other Ambulatory Visit: Payer: Self-pay | Admitting: Physician Assistant

## 2023-01-29 DIAGNOSIS — M0609 Rheumatoid arthritis without rheumatoid factor, multiple sites: Secondary | ICD-10-CM

## 2023-01-29 DIAGNOSIS — Z79899 Other long term (current) drug therapy: Secondary | ICD-10-CM

## 2023-01-31 ENCOUNTER — Other Ambulatory Visit: Payer: Self-pay | Admitting: Internal Medicine

## 2023-02-02 ENCOUNTER — Encounter: Payer: Self-pay | Admitting: Family Medicine

## 2023-02-02 ENCOUNTER — Ambulatory Visit: Payer: Commercial Managed Care - PPO | Admitting: Family Medicine

## 2023-02-02 VITALS — BP 122/84 | HR 75 | Temp 97.6°F | Ht 68.0 in | Wt 278.0 lb

## 2023-02-02 DIAGNOSIS — L719 Rosacea, unspecified: Secondary | ICD-10-CM | POA: Insufficient documentation

## 2023-02-02 DIAGNOSIS — R1032 Left lower quadrant pain: Secondary | ICD-10-CM | POA: Diagnosis not present

## 2023-02-02 LAB — POC URINALSYSI DIPSTICK (AUTOMATED)
Bilirubin, UA: NEGATIVE
Blood, UA: NEGATIVE
Glucose, UA: NEGATIVE
Ketones, UA: NEGATIVE
Leukocytes, UA: NEGATIVE
Nitrite, UA: NEGATIVE
Protein, UA: NEGATIVE
Spec Grav, UA: >=1.03 — AB (ref 1.010–1.025)
Urobilinogen, UA: 0.2 U/dL
pH, UA: 5.5 (ref 5.0–8.0)

## 2023-02-02 NOTE — Patient Instructions (Addendum)
You may call to schedule with one of these specialists:    [facility] [address] [place], [place] [address]  [phone]  Or  [facility]  [address] [place], [place] [address] [phone]    Continue to monitor your symptoms. Look for possible causes or associated symptoms.   Please call and schedule with your OB/GYN

## 2023-02-02 NOTE — Progress Notes (Signed)
Subjective:     Patient ID: Erica Fox, female    DOB: [birth year], 37 y.o.   MRN: [id number]  Chief Complaint  Patient presents with   Abdominal Pain    Lower left abdominal pain, going on and off for a few months but consistent for the last few weeks. Sharp pain comes and goes (sometimes can last seconds, other times it can linger around longer)    Abdominal Pain Associated symptoms include frequency. Pertinent negatives include no constipation, diarrhea, dysuria, fever, hematuria, nausea or vomiting.     History of Present Illness         C/o intermittent sharp pain in LLQ. It lasts a few seconds to 1-2 minutes.   More consistent over the past month, 1-3 times per week.   Last episode of pain 2 days ago.   Denies any changes in bowel habits. Pain is not associated with movement.   Denies fever, chills, dizziness, chest pain, palpitations, shortness of breath, N/V/D or constipation, urinary symptoms    Health Maintenance Due  Topic Date Due   DTaP/Tdap/Td (1 - Tdap) Never done   Cervical Cancer Screening (HPV/Pap Cotest)  Never done    Past Medical History:  Diagnosis Date   Anemia    Degenerative joint disease     Past Surgical History:  Procedure Laterality Date   CESAREAN SECTION     2007, 2012   EYE SURGERY Right    age 31, tighten muscle for lazy eye   FOOT SURGERY Left    hammertoe correction & screws placed   FOOT SURGERY Right 05/07/2022   hammertoe correction   TUBAL LIGATION     WISDOM TOOTH EXTRACTION  2014    Family History  Problem Relation Age of Onset   Heart disease Mother    Breast cancer Paternal Aunt    Healthy Son    Healthy Son     Social History   Socioeconomic History   Marital status: Married    Spouse name: Not on file   Number of children: Not on file   Years of education: Not on file   Highest education level: Not on file  Occupational History   Not on file  Tobacco Use   Smoking status: Never    Passive  exposure: Past   Smokeless tobacco: Never  Vaping Use   Vaping status: Never Used  Substance and Sexual Activity   Alcohol use: Yes    Comment: very rarely   Drug use: Not Currently   Sexual activity: Yes    Birth control/protection: I.U.D.  Other Topics Concern   Not on file  Social History Narrative   Not on file   Social Determinants of Health   Financial Resource Strain: Not on file  Food Insecurity: Not on file  Transportation Needs: Not on file  Physical Activity: Not on file  Stress: Not on file  Social Connections: Not on file  Intimate Partner Violence: Not on file    Outpatient Medications Prior to Visit  Medication Sig Dispense Refill   azelastine (ASTELIN) 0.1 % nasal spray Place 1 spray into both nostrils 2 (two) times daily as needed for rhinitis. Use in each nostril as directed 30 mL 5   folic acid (FOLVITE) 1 MG tablet Take 2 tablets (2 mg total) by mouth daily. 180 tablet 3   Methotrexate, PF, (RASUVO) 25 MG/0.5ML SOAJ Inject 25 mg into the skin once a week. 2 mL 1   Upadacitinib ER (RINVOQ) 15  MG TB24 Take 1 tablet (15 mg total) by mouth daily. 90 tablet 0   Vitamin D, Ergocalciferol, (DRISDOL) 1.25 MG (50000 UNIT) CAPS capsule Take 1 capsule (50,000 Units total) by mouth every 7 (seven) days. 12 capsule 0   cetirizine (ZYRTEC ALLERGY) 10 MG tablet Take 1 tablet (10 mg total) by mouth daily. (Patient not taking: Reported on 10/06/2022) 30 tablet 5   fluticasone (FLONASE) 50 MCG/ACT nasal spray Place 2 sprays into both nostrils daily. (Patient not taking: Reported on 10/06/2022) 16 g 5   methotrexate (RHEUMATREX) 2.5 MG tablet Take 10 tablets (25 mg total) by mouth once a week. Split dosing into morning and evening (Patient not taking: Reported on 01/04/2023) 40 tablet 0   predniSONE (DELTASONE) 5 MG tablet Take 4 tablets by mouth daily x1 week, 3 tablets daily x1wk, 2 tablets daily x1wk, 1 tablet daily x1wk. (Patient not taking: Reported on 01/04/2023) 70 tablet 0    predniSONE (DELTASONE) 5 MG tablet Take 4 tabs po x 4 days, 3  tabs po x 4 days, 2  tabs po x 4 days, 1  tab po x 4 days 40 tablet 0   traMADol (ULTRAM) 50 MG tablet SMARTSIG:1 Tablet(s) By Mouth Every 12 Hours PRN     No facility-administered medications prior to visit.    Allergies  Allergen Reactions   Penicillins Other (See Comments)    Adopted mother informed her of allergy but unaware of reaction.    Review of Systems  Constitutional:  Negative for chills, fever and malaise/fatigue.  Respiratory:  Negative for shortness of breath.   Cardiovascular:  Negative for chest pain, palpitations and leg swelling.  Gastrointestinal:  Positive for abdominal pain. Negative for constipation, diarrhea, nausea and vomiting.  Genitourinary:  Positive for frequency. Negative for dysuria, flank pain, hematuria and urgency.  Neurological:  Negative for dizziness and focal weakness.       Objective:    Physical Exam Constitutional:      General: She is not in acute distress.    Appearance: She is not ill-appearing.  Eyes:     Extraocular Movements: Extraocular movements intact.     Conjunctiva/sclera: Conjunctivae normal.  Cardiovascular:     Rate and Rhythm: Normal rate.  Pulmonary:     Effort: Pulmonary effort is normal.  Abdominal:     General: Abdomen is flat. Bowel sounds are normal. There is no distension.     Palpations: Abdomen is soft.     Tenderness: There is no abdominal tenderness.  Musculoskeletal:     Cervical back: Normal range of motion and neck supple.  Skin:    General: Skin is warm and dry.  Neurological:     General: No focal deficit present.     Mental Status: She is alert and oriented to person, place, and time.  Psychiatric:        Mood and Affect: Mood normal.        Behavior: Behavior normal.        Thought Content: Thought content normal.      BP 122/84 (BP Location: Left Arm, Patient Position: Sitting, Cuff Size: Large)   Pulse 75   Temp 97.6 F  (36.4 C) (Temporal)   Ht 5\' 8"  (1.727 m)   Wt 278 lb (126.1 kg)   SpO2 98%   BMI 42.27 kg/m  Wt Readings from Last 3 Encounters:  02/02/23 278 lb (126.1 kg)  01/04/23 277 lb (125.6 kg)  11/19/22 275 lb (124.7 kg)  Assessment & Plan:   Problem List Items Addressed This Visit   None Visit Diagnoses     Intermittent left lower quadrant abdominal pain    -  Primary   Relevant Orders   POCT Urinalysis Dipstick (Automated) (Completed)      Pain is intermittent and brief. Last episode 2 days ago lasting seconds to 1-2 minutes and resolved spontaneously. No red flag symptoms. No sign of infection.  UA negative.  Recommend follow up with OB/GYN for possible ovarian cyst or other gyn etiologies.  If negative and pain persists, consider referral to GI.    I have discontinued Juliyah Overacker's traMADol, predniSONE, and predniSONE. I am also having her maintain her fluticasone, azelastine, cetirizine, folic acid, Vitamin D (Ergocalciferol), methotrexate, Rasuvo, and Rinvoq.  No orders of the defined types were placed in this encounter.

## 2023-02-16 ENCOUNTER — Other Ambulatory Visit: Payer: Self-pay | Admitting: Medical Genetics

## 2023-02-18 ENCOUNTER — Other Ambulatory Visit (HOSPITAL_COMMUNITY): Payer: Commercial Managed Care - PPO | Attending: Medical Genetics

## 2023-02-18 NOTE — Progress Notes (Unsigned)
Office Visit Note  Patient: Erica Fox             Date of Birth: [birth year]           MRN: [id number]             PCP: Avanell Shackleton, NP-C Referring: Avanell Shackleton, NP-C Visit Date: 03/03/2023 Occupation: @GUAROCC @  Subjective:  Medication monitoring   History of Present Illness: Erica Fox is a 37 y.o. female with history of seronegative rheumatoid arthritis.  Patient was started on Rinvoq 15 mg 1 tablet by mouth daily. She has also remained on Rasuvo 25 mg subcu days injections once weekly and folic acid 2 mg daily.   CBC and CMP updated on 01/04/23. Orders for CBC and CMP released today.  Her next lab work will be due in March and every 3 months.  TB gold negative 07/22/22. Lipid panel  Discussed the importance of holding rinvoq and rasuvo if she develops signs or symptoms of an infection and to resume once the infection has completely cleared.   Activities of Daily Living:  Patient reports morning stiffness for *** {minute/hour:19697}.   Patient {ACTIONS;DENIES/REPORTS:21021675::"Denies"} nocturnal pain.  Difficulty dressing/grooming: {ACTIONS;DENIES/REPORTS:21021675::"Denies"} Difficulty climbing stairs: {ACTIONS;DENIES/REPORTS:21021675::"Denies"} Difficulty getting out of chair: {ACTIONS;DENIES/REPORTS:21021675::"Denies"} Difficulty using hands for taps, buttons, cutlery, and/or writing: {ACTIONS;DENIES/REPORTS:21021675::"Denies"}  No Rheumatology ROS completed.   PMFS History:  Patient Active Problem List   Diagnosis Date Noted   Rosacea 02/02/2023   Seasonal allergies 08/26/2022   Rheumatoid arthritis of multiple sites with negative rheumatoid factor (HCC) 07/22/2022    Past Medical History:  Diagnosis Date   Anemia    Degenerative joint disease     Family History  Problem Relation Age of Onset   Heart disease Mother    Breast cancer Paternal Aunt    Healthy Son    Healthy Son    Past Surgical History:  Procedure Laterality Date   CESAREAN SECTION      2007, 2012   EYE SURGERY Right    age 77, tighten muscle for lazy eye   FOOT SURGERY Left    hammertoe correction & screws placed   FOOT SURGERY Right 05/07/2022   hammertoe correction   TUBAL LIGATION     WISDOM TOOTH EXTRACTION  2014   Social History   Social History Narrative   Not on file    There is no immunization history on file for this patient.   Objective: Vital Signs: There were no vitals taken for this visit.   Physical Exam Vitals and nursing note reviewed.  Constitutional:      Appearance: She is well-developed.  HENT:     Head: Normocephalic and atraumatic.  Eyes:     Conjunctiva/sclera: Conjunctivae normal.  Cardiovascular:     Rate and Rhythm: Normal rate and regular rhythm.     Heart sounds: Normal heart sounds.  Pulmonary:     Effort: Pulmonary effort is normal.     Breath sounds: Normal breath sounds.  Abdominal:     General: Bowel sounds are normal.     Palpations: Abdomen is soft.  Musculoskeletal:     Cervical back: Normal range of motion.  Lymphadenopathy:     Cervical: No cervical adenopathy.  Skin:    General: Skin is warm and dry.     Capillary Refill: Capillary refill takes less than 2 seconds.  Neurological:     Mental Status: She is alert and oriented to person, place, and time.  Psychiatric:  Behavior: Behavior normal.      Musculoskeletal Exam: ***  CDAI Exam: CDAI Score: -- Patient Global: --; Provider Global: -- Swollen: --; Tender: -- Joint Exam 03/03/2023   No joint exam has been documented for this visit   There is currently no information documented on the homunculus. Go to the Rheumatology activity and complete the homunculus joint exam.  Investigation: No additional findings.  Imaging: No results found.  Recent Labs: Lab Results  Component Value Date   WBC 8.8 01/04/2023   HGB 13.0 01/04/2023   PLT 134 (L) 01/04/2023   NA 138 01/04/2023   K 3.7 01/04/2023   CL 107 01/04/2023   CO2 23  01/04/2023   GLUCOSE 86 01/04/2023   BUN 11 01/04/2023   CREATININE 0.64 01/04/2023   BILITOT 0.6 01/04/2023   ALKPHOS 66 03/11/2022   AST 16 01/04/2023   ALT 14 01/04/2023   PROT 7.0 01/04/2023   ALBUMIN 4.8 03/11/2022   CALCIUM 8.9 01/04/2023   QFTBGOLDPLUS NEGATIVE 07/22/2022    Speciality Comments: Enbrel-discontinued Humira-inadequate response Simponi started October 05, 2022 Methotrexate started August 26, 2022  Procedures:  No procedures performed Allergies: Penicillins   Assessment / Plan:     Visit Diagnoses: Rheumatoid arthritis of multiple sites with negative rheumatoid factor (HCC)  High risk medication use  Effusion, left knee  Pain in both hands  Chronic pain of both knees  Pain in both feet  JIA (juvenile idiopathic arthritis) (HCC)  Elevated sed rate  IgA deficiency (HCC)  Seasonal allergies  Other fatigue  Vitamin D deficiency  Orders: No orders of the defined types were placed in this encounter.  No orders of the defined types were placed in this encounter.   Face-to-face time spent with patient was *** minutes. Greater than 50% of time was spent in counseling and coordination of care.  Follow-Up Instructions: No follow-ups on file.   Gearldine Bienenstock, PA-C  Note - This record has been created using Dragon software.  Chart creation errors have been sought, but may not always  have been located. Such creation errors do not reflect on  the standard of medical care.

## 2023-02-23 ENCOUNTER — Other Ambulatory Visit: Payer: Self-pay | Admitting: Medical Genetics

## 2023-02-27 ENCOUNTER — Other Ambulatory Visit: Payer: Self-pay | Admitting: Physician Assistant

## 2023-02-27 DIAGNOSIS — M0609 Rheumatoid arthritis without rheumatoid factor, multiple sites: Secondary | ICD-10-CM

## 2023-02-27 DIAGNOSIS — Z79899 Other long term (current) drug therapy: Secondary | ICD-10-CM

## 2023-02-28 NOTE — Telephone Encounter (Signed)
Last Fill: 12/24/2022  Labs: 01/04/2023 Platelet count is borderline low. Rest of CBC WNL. CMP WNL.    Next Visit: 03/03/2023  Last Visit: 01/04/2023  DX: Rheumatoid arthritis of multiple sites with negative rheumatoid factor   Current Dose per office note 01/04/2023: Rasuvo 25 mg subcu days injections once weekly   Okay to refill Rasuvo?

## 2023-03-01 DIAGNOSIS — H35719 Central serous chorioretinopathy, unspecified eye: Secondary | ICD-10-CM

## 2023-03-01 HISTORY — DX: Central serous chorioretinopathy, unspecified eye: H35.719

## 2023-03-03 ENCOUNTER — Encounter: Payer: Self-pay | Admitting: Physician Assistant

## 2023-03-03 ENCOUNTER — Ambulatory Visit: Payer: Commercial Managed Care - PPO | Attending: Physician Assistant | Admitting: Physician Assistant

## 2023-03-03 VITALS — BP 125/76 | HR 72 | Resp 15 | Ht 68.0 in | Wt 280.6 lb

## 2023-03-03 DIAGNOSIS — E559 Vitamin D deficiency, unspecified: Secondary | ICD-10-CM

## 2023-03-03 DIAGNOSIS — M79642 Pain in left hand: Secondary | ICD-10-CM

## 2023-03-03 DIAGNOSIS — M25562 Pain in left knee: Secondary | ICD-10-CM

## 2023-03-03 DIAGNOSIS — R700 Elevated erythrocyte sedimentation rate: Secondary | ICD-10-CM

## 2023-03-03 DIAGNOSIS — G8929 Other chronic pain: Secondary | ICD-10-CM

## 2023-03-03 DIAGNOSIS — R5383 Other fatigue: Secondary | ICD-10-CM

## 2023-03-03 DIAGNOSIS — Z79899 Other long term (current) drug therapy: Secondary | ICD-10-CM

## 2023-03-03 DIAGNOSIS — M25561 Pain in right knee: Secondary | ICD-10-CM

## 2023-03-03 DIAGNOSIS — M79671 Pain in right foot: Secondary | ICD-10-CM

## 2023-03-03 DIAGNOSIS — M0880 Other juvenile arthritis, unspecified site: Secondary | ICD-10-CM

## 2023-03-03 DIAGNOSIS — M0609 Rheumatoid arthritis without rheumatoid factor, multiple sites: Secondary | ICD-10-CM

## 2023-03-03 DIAGNOSIS — Z13220 Encounter for screening for lipoid disorders: Secondary | ICD-10-CM

## 2023-03-03 DIAGNOSIS — M79672 Pain in left foot: Secondary | ICD-10-CM

## 2023-03-03 DIAGNOSIS — M25462 Effusion, left knee: Secondary | ICD-10-CM

## 2023-03-03 DIAGNOSIS — J302 Other seasonal allergic rhinitis: Secondary | ICD-10-CM

## 2023-03-03 DIAGNOSIS — D802 Selective deficiency of immunoglobulin A [IgA]: Secondary | ICD-10-CM

## 2023-03-03 DIAGNOSIS — M79641 Pain in right hand: Secondary | ICD-10-CM

## 2023-03-03 NOTE — Patient Instructions (Signed)

## 2023-03-04 LAB — COMPLETE METABOLIC PANEL WITH GFR
AG Ratio: 1.5 (calc) (ref 1.0–2.5)
ALT: 12 U/L (ref 6–29)
AST: 14 U/L (ref 10–30)
Albumin: 4.3 g/dL (ref 3.6–5.1)
Alkaline phosphatase (APISO): 59 U/L (ref 31–125)
BUN: 13 mg/dL (ref 7–25)
CO2: 25 mmol/L (ref 20–32)
Calcium: 9.4 mg/dL (ref 8.6–10.2)
Chloride: 107 mmol/L (ref 98–110)
Creat: 0.62 mg/dL (ref 0.50–0.97)
Globulin: 2.8 g/dL (ref 1.9–3.7)
Glucose, Bld: 88 mg/dL (ref 65–99)
Potassium: 4.3 mmol/L (ref 3.5–5.3)
Sodium: 138 mmol/L (ref 135–146)
Total Bilirubin: 0.6 mg/dL (ref 0.2–1.2)
Total Protein: 7.1 g/dL (ref 6.1–8.1)
eGFR: 117 mL/min/{1.73_m2} (ref >=60)

## 2023-03-04 LAB — CBC WITH DIFFERENTIAL/PLATELET
Absolute Lymphocytes: 2310 {cells}/uL (ref 850–3900)
Absolute Monocytes: 393 {cells}/uL (ref 200–950)
Basophils Absolute: 31 {cells}/uL (ref 0–200)
Basophils Relative: 0.4 %
Eosinophils Absolute: 108 {cells}/uL (ref 15–500)
Eosinophils Relative: 1.4 %
HCT: 39.5 % (ref 35.0–45.0)
Hemoglobin: 13 g/dL (ref 11.7–15.5)
MCH: 31.9 pg (ref 27.0–33.0)
MCHC: 32.9 g/dL (ref 32.0–36.0)
MCV: 97.1 fL (ref 80.0–100.0)
MPV: 10.4 fL (ref 7.5–12.5)
Monocytes Relative: 5.1 %
Neutro Abs: 4859 {cells}/uL (ref 1500–7800)
Neutrophils Relative %: 63.1 %
Platelets: 221 10*3/uL (ref 140–400)
RBC: 4.07 10*6/uL (ref 3.80–5.10)
RDW: 13.2 % (ref 11.0–15.0)
Total Lymphocyte: 30 %
WBC: 7.7 10*3/uL (ref 3.8–10.8)

## 2023-03-04 LAB — LIPID PANEL
Cholesterol: 160 mg/dL (ref <200)
HDL: 56 mg/dL (ref >=50)
LDL Cholesterol (Calc): 91 mg/dL
Non-HDL Cholesterol (Calc): 104 mg/dL (ref <130)
Total CHOL/HDL Ratio: 2.9 (calc) (ref <5.0)
Triglycerides: 43 mg/dL (ref <150)

## 2023-03-04 LAB — VITAMIN D 25 HYDROXY (VIT D DEFICIENCY, FRACTURES): Vit D, 25-Hydroxy: 33 ng/mL (ref 30–100)

## 2023-03-04 NOTE — Progress Notes (Signed)
CBC and CMP WNL.  Lipid panel WNL  Vitamin D WNL-continue maintenance dose of vitamin D.

## 2023-03-22 ENCOUNTER — Other Ambulatory Visit: Payer: Self-pay | Admitting: Physician Assistant

## 2023-03-22 DIAGNOSIS — Z79899 Other long term (current) drug therapy: Secondary | ICD-10-CM

## 2023-03-22 DIAGNOSIS — M0609 Rheumatoid arthritis without rheumatoid factor, multiple sites: Secondary | ICD-10-CM

## 2023-03-22 NOTE — Telephone Encounter (Signed)
 Last Fill: 01/05/2023  Labs: 03/03/2023 CBC and CMP WNL. Lipid panel WNL  TB Gold: 06/01/2023   Next Visit: 03/03/2023  Last Visit: 03/03/2023  DX: Rheumatoid arthritis of multiple sites with negative rheumatoid factor   Current Dose per office note 03/03/2023: Rinvoq  15 mg 1 tablet by mouth daily,   Okay to refill Rinvoq ?

## 2023-03-23 DIAGNOSIS — F429 Obsessive-compulsive disorder, unspecified: Secondary | ICD-10-CM

## 2023-03-23 DIAGNOSIS — F4310 Post-traumatic stress disorder, unspecified: Secondary | ICD-10-CM

## 2023-03-23 DIAGNOSIS — F909 Attention-deficit hyperactivity disorder, unspecified type: Secondary | ICD-10-CM

## 2023-03-23 HISTORY — DX: Post-traumatic stress disorder, unspecified: F43.10

## 2023-03-23 HISTORY — DX: Obsessive-compulsive disorder, unspecified: F42.9

## 2023-03-23 HISTORY — DX: Attention-deficit hyperactivity disorder, unspecified type: F90.9

## 2023-03-30 ENCOUNTER — Other Ambulatory Visit (HOSPITAL_COMMUNITY)
Admission: RE | Admit: 2023-03-30 | Discharge: 2023-03-30 | Disposition: A | Discharge status: Home / Self Care | Payer: 59 | Source: Ambulatory Visit | Attending: Radiology | Admitting: Radiology

## 2023-03-30 ENCOUNTER — Ambulatory Visit: Payer: 59 | Admitting: Radiology

## 2023-03-30 ENCOUNTER — Encounter: Payer: Self-pay | Admitting: Radiology

## 2023-03-30 VITALS — BP 116/82 | HR 88 | Ht 68.0 in | Wt 272.0 lb

## 2023-03-30 DIAGNOSIS — R1032 Left lower quadrant pain: Secondary | ICD-10-CM | POA: Diagnosis not present

## 2023-03-30 DIAGNOSIS — Z01419 Encounter for gynecological examination (general) (routine) without abnormal findings: Secondary | ICD-10-CM

## 2023-03-30 NOTE — Telephone Encounter (Signed)
Ok to apply for visco supplementation for both knees.

## 2023-03-30 NOTE — Progress Notes (Signed)
 Erica Fox 1985/06/05 [id number]   History:  38 y.o. G3P2 presents for annual exam as a new patient. C/o intermittent stabbing LLQ pain x 1 month, referred by PCP for eval. No other gyn concerns. Mirena  placed 02/2022 per pt.  Gynecologic History No LMP recorded. (Menstrual status: IUD).   Contraception/Family planning: IUD Sexually active: yes Last Pap: >5 years ago. Results were: normal per pt   Obstetric History OB History  Gravida Para Term Preterm AB Living  3 2    2   SAB IAB Ectopic Multiple Live Births      2    # Outcome Date GA Lbr Len/2nd Weight Sex Type Anes PTL Lv  3 Gravida           2 Para           1 Para             The following portions of the patient's history were reviewed and updated as appropriate: allergies, current medications, past family history, past medical history, past social history, past surgical history, and problem list.  Review of Systems  All other systems reviewed and are negative.   Past medical history, past surgical history, family history and social history were all reviewed and documented in the EPIC chart.  Exam:  Vitals:   03/30/23 0847  BP: 116/82  Pulse: 88  SpO2: 99%  Weight: 272 lb (123.4 kg)  Height: 5' 8 (1.727 m)   Body mass index is 41.36 kg/m.  Physical Exam Vitals and nursing note reviewed. Exam conducted with a chaperone present.  Constitutional:      Appearance: Normal appearance. Erica Fox is normal weight.  HENT:     Head: Normocephalic and atraumatic.  Neck:     Thyroid: No thyroid mass, thyromegaly or thyroid tenderness.  Cardiovascular:     Rate and Rhythm: Regular rhythm.     Heart sounds: Normal heart sounds.  Pulmonary:     Effort: Pulmonary effort is normal.     Breath sounds: Normal breath sounds.  Chest:  Breasts:    Breasts are symmetrical.     Right: Normal. No inverted nipple, mass, nipple discharge, skin change or tenderness.     Left: Normal. No inverted nipple, mass, nipple  discharge, skin change or tenderness.  Abdominal:     General: Abdomen is flat. Bowel sounds are normal.     Palpations: Abdomen is soft.  Genitourinary:    General: Normal vulva.     Vagina: Normal. No vaginal discharge, bleeding or lesions.     Cervix: Normal. No discharge or lesion.     Uterus: Normal. Not enlarged and not tender.      Adnexa: Right adnexa normal.       Right: No mass, tenderness or fullness.         Left: Tenderness present. No mass or fullness.       Comments: IUD strings seen 2cm from os Lymphadenopathy:     Upper Body:     Right upper body: No axillary adenopathy.     Left upper body: No axillary adenopathy.  Skin:    General: Skin is warm and dry.  Neurological:     Mental Status: Erica Fox is alert and oriented to person, place, and time.  Psychiatric:        Mood and Affect: Mood normal.        Thought Content: Thought content normal.        Judgment: Judgment normal.  Darice Hoit, CMA present for exam  Assessment/Plan:   1. Well woman exam with routine gynecological exam (Primary) - Cytology - PAP( [place])  2. LLQ pain - US  Transvaginal Non-OB; Future   Discussed SBE, pap screening as directed/appropriate. Recommend of exercise weekly, including weight bearing exercise.   Return for us / then JC.  GINETTE COZIER B WHNP-BC 9:04 AM 03/30/2023

## 2023-03-30 NOTE — Patient Instructions (Signed)

## 2023-03-31 ENCOUNTER — Telehealth: Payer: Self-pay | Admitting: Rheumatology

## 2023-03-31 NOTE — Telephone Encounter (Signed)
 VOB submitted for Euflexxa, Bilateral knee(s) BV pending

## 2023-03-31 NOTE — Telephone Encounter (Signed)
 Ok to apply for visco supplementation for both knees per Sherron Ales

## 2023-04-01 LAB — CYTOLOGY - PAP
Comment: NEGATIVE
Diagnosis: NEGATIVE
High risk HPV: NEGATIVE

## 2023-04-06 ENCOUNTER — Other Ambulatory Visit (HOSPITAL_COMMUNITY): Payer: Self-pay | Attending: Medical Genetics

## 2023-04-07 ENCOUNTER — Other Ambulatory Visit: Payer: 59 | Admitting: Radiology

## 2023-04-07 ENCOUNTER — Other Ambulatory Visit: Payer: 59

## 2023-04-20 NOTE — Telephone Encounter (Signed)
Euflexxa authorization #[id number] Auth dates:  04/20/2023 to 04/19/2024

## 2023-04-20 NOTE — Telephone Encounter (Signed)
Please call to schedule visco injections.  Approved for Euflexxa, Bilateral knee(s). Buy & Bill [phone] Co-Pay Deductible does not apply Drug and administration are covered at 100% of the allowable amount Once the OOP has been met $6350 (Met $8.14), copay will be waived

## 2023-04-21 ENCOUNTER — Ambulatory Visit: Payer: 59 | Attending: Physician Assistant | Admitting: Physician Assistant

## 2023-04-21 DIAGNOSIS — M170 Bilateral primary osteoarthritis of knee: Secondary | ICD-10-CM | POA: Diagnosis not present

## 2023-04-21 MED ORDER — LIDOCAINE HCL 1 % IJ SOLN
1.5000 mL | INTRAMUSCULAR | Status: AC | PRN
  Administered 2023-04-21: 1.5 mL

## 2023-04-21 MED ORDER — SODIUM HYALURONATE (VISCOSUP) 20 MG/2ML IX SOSY
20.0000 mg | PREFILLED_SYRINGE | INTRA_ARTICULAR | Status: AC | PRN
  Administered 2023-04-21: 20 mg via INTRA_ARTICULAR

## 2023-04-21 NOTE — Progress Notes (Signed)
   Procedure Note  Patient: Ellery Tash             Date of Birth: 04-01-1985           MRN: [id number]             Visit Date: 04/21/2023  Procedures: Visit Diagnoses:  1. Primary osteoarthritis of both knees     Euflexxa #1 bilateral knees, B/B Large Joint Inj: bilateral knee on 04/21/2023 3:00 PM Indications: pain Details: 27 G 1.5 in needle, medial approach  Arthrogram: No  Medications (Right): 1.5 mL lidocaine 1 %; 20 mg Sodium Hyaluronate (Viscosup) 20 MG/2ML Aspirate (Right): 0 mL Medications (Left): 1.5 mL lidocaine 1 %; 20 mg Sodium Hyaluronate (Viscosup) 20 MG/2ML Aspirate (Left): 0 mL Outcome: tolerated well, no immediate complications Procedure, treatment alternatives, risks and benefits explained, specific risks discussed. Consent was given by the patient.    Patient was provided paperwork to renew her temporary handicap placard.  Patient tolerated the procedures well.  Procedure notes were completed above.  Aftercare was discussed. Sherron Ales, PA-C

## 2023-04-22 ENCOUNTER — Other Ambulatory Visit: Payer: Self-pay | Admitting: Rheumatology

## 2023-04-22 DIAGNOSIS — M0609 Rheumatoid arthritis without rheumatoid factor, multiple sites: Secondary | ICD-10-CM

## 2023-04-22 DIAGNOSIS — Z79899 Other long term (current) drug therapy: Secondary | ICD-10-CM

## 2023-04-27 ENCOUNTER — Ambulatory Visit (INDEPENDENT_AMBULATORY_CARE_PROVIDER_SITE_OTHER): Payer: 59

## 2023-04-27 ENCOUNTER — Encounter: Payer: Self-pay | Admitting: Podiatry

## 2023-04-27 ENCOUNTER — Ambulatory Visit (INDEPENDENT_AMBULATORY_CARE_PROVIDER_SITE_OTHER): Payer: 59 | Admitting: Podiatry

## 2023-04-27 DIAGNOSIS — M779 Enthesopathy, unspecified: Secondary | ICD-10-CM

## 2023-04-27 DIAGNOSIS — M7662 Achilles tendinitis, left leg: Secondary | ICD-10-CM

## 2023-04-27 DIAGNOSIS — M62462 Contracture of muscle, left lower leg: Secondary | ICD-10-CM | POA: Diagnosis not present

## 2023-04-27 NOTE — Patient Instructions (Signed)
 Achilles Tendinitis  with Rehab Achilles tendinitis is a disorder of the Achilles tendon. The Achilles tendon connects the large calf muscles (Gastrocnemius and Soleus) to the heel bone (calcaneus). This tendon is sometimes called the heel cord. It is important for pushing-off and standing on your toes and is important for walking, running, or jumping. Tendinitis is often caused by overuse and repetitive microtrauma. SYMPTOMS Pain, tenderness, swelling, warmth, and redness may occur over the Achilles tendon even at rest. Pain with pushing off, or flexing or extending the ankle. Pain that is worsened after or during activity. CAUSES  Overuse sometimes seen with rapid increase in exercise programs or in sports requiring running and jumping. Poor physical conditioning (strength and flexibility or endurance). Running sports, especially training running down hills. Inadequate warm-up before practice or play or failure to stretch before participation. Injury to the tendon. PREVENTION  Warm up and stretch before practice or competition. Allow time for adequate rest and recovery between practices and competition. Keep up conditioning. Keep up ankle and leg flexibility. Improve or keep muscle strength and endurance. Improve cardiovascular fitness. Use proper technique. Use proper equipment (shoes, skates). To help prevent recurrence, taping, protective strapping, or an adhesive bandage may be recommended for several weeks after healing is complete. PROGNOSIS  Recovery may take weeks to several months to heal. Longer recovery is expected if symptoms have been prolonged. Recovery is usually quicker if the inflammation is due to a direct blow as compared with overuse or sudden strain. RELATED COMPLICATIONS  Healing time will be prolonged if the condition is not correctly treated. The injury must be given plenty of time to heal. Symptoms can reoccur if activity is resumed too soon. Untreated,  tendinitis may increase the risk of tendon rupture requiring additional time for recovery and possibly surgery. TREATMENT  The first treatment consists of rest anti-inflammatory medication, and ice to relieve the pain. Stretching and strengthening exercises after resolution of pain will likely help reduce the risk of recurrence. Referral to a physical therapist or athletic trainer for further evaluation and treatment may be helpful. A walking boot or cast may be recommended to rest the Achilles tendon. This can help break the cycle of inflammation and microtrauma. Arch supports (orthotics) may be prescribed or recommended by your caregiver as an adjunct to therapy and rest. Surgery to remove the inflamed tendon lining or degenerated tendon tissue is rarely necessary and has shown less than predictable results. MEDICATION  Nonsteroidal anti-inflammatory medications, such as aspirin  and ibuprofen , may be used for pain and inflammation relief. Do not take within 7 days before surgery. Take these as directed by your caregiver. Contact your caregiver immediately if any bleeding, stomach upset, or signs of allergic reaction occur. Other minor pain relievers, such as acetaminophen , may also be used. Pain relievers may be prescribed as necessary by your caregiver. Do not take prescription pain medication for longer than 4 to 7 days. Use only as directed and only as much as you need. Cortisone injections are rarely indicated. Cortisone injections may weaken tendons and predispose to rupture. It is better to give the condition more time to heal than to use them. HEAT AND COLD Cold is used to relieve pain and reduce inflammation for acute and chronic Achilles tendinitis. Cold should be applied for 10 to 15 minutes every 2 to 3 hours for inflammation and pain and immediately after any activity that aggravates your symptoms. Use ice packs or an ice massage. Heat may be used before performing stretching  and  strengthening activities prescribed by your caregiver. Use a heat pack or a warm soak. SEEK MEDICAL CARE IF: Symptoms get worse or do not improve in 2 weeks despite treatment. New, unexplained symptoms develop. Drugs used in treatment may produce side effects.  EXERCISES:  RANGE OF MOTION (ROM) AND STRETCHING EXERCISES - Achilles Tendinitis  These exercises may help you when beginning to rehabilitate your injury. Your symptoms may resolve with or without further involvement from your physician, physical therapist or athletic trainer. While completing these exercises, remember:  Restoring tissue flexibility helps normal motion to return to the joints. This allows healthier, less painful movement and activity. An effective stretch should be held for at least 30 seconds. A stretch should never be painful. You should only feel a gentle lengthening or release in the stretched tissue.  STRETCH  Gastroc, Standing  Place hands on wall. Extend right / left leg, keeping the front knee somewhat bent. Slightly point your toes inward on your back foot. Keeping your right / left heel on the floor and your knee straight, shift your weight toward the wall, not allowing your back to arch. You should feel a gentle stretch in the right / left calf. Hold this position for 10 seconds. Repeat 3 times. Complete this stretch 2 times per day.  STRETCH  Soleus, Standing  Place hands on wall. Extend right / left leg, keeping the other knee somewhat bent. Slightly point your toes inward on your back foot. Keep your right / left heel on the floor, bend your back knee, and slightly shift your weight over the back leg so that you feel a gentle stretch deep in your back calf. Hold this position for 10 seconds. Repeat 3 times. Complete this stretch 2 times per day.  STRETCH  Gastrocsoleus, Standing  Note: This exercise can place a lot of stress on your foot and ankle. Please complete this exercise only if specifically  instructed by your caregiver.  Place the ball of your right / left foot on a step, keeping your other foot firmly on the same step. Hold on to the wall or a rail for balance. Slowly lift your other foot, allowing your body weight to press your heel down over the edge of the step. You should feel a stretch in your right / left calf. Hold this position for 10 seconds. Repeat this exercise with a slight bend in your knee. Repeat 3 times. Complete this stretch 2 times per day.   STRENGTHENING EXERCISES - Achilles Tendinitis These exercises may help you when beginning to rehabilitate your injury. They may resolve your symptoms with or without further involvement from your physician, physical therapist or athletic trainer. While completing these exercises, remember:  Muscles can gain both the endurance and the strength needed for everyday activities through controlled exercises. Complete these exercises as instructed by your physician, physical therapist or athletic trainer. Progress the resistance and repetitions only as guided. You may experience muscle soreness or fatigue, but the pain or discomfort you are trying to eliminate should never worsen during these exercises. If this pain does worsen, stop and make certain you are following the directions exactly. If the pain is still present after adjustments, discontinue the exercise until you can discuss the trouble with your clinician.  STRENGTH - Plantar-flexors  Sit with your right / left leg extended. Holding onto both ends of a rubber exercise band/tubing, loop it around the ball of your foot. Keep a slight tension in the band. Slowly  push your toes away from you, pointing them downward. Hold this position for 10 seconds. Return slowly, controlling the tension in the band/tubing. Repeat 3 times. Complete this exercise 2 times per day.   STRENGTH - Plantar-flexors  Stand with your feet shoulder width apart. Steady yourself with a wall or table  using as little support as needed. Keeping your weight evenly spread over the width of your feet, rise up on your toes.* Hold this position for 10 seconds. Repeat 3 times. Complete this exercise 2 times per day.  *If this is too easy, shift your weight toward your right / left leg until you feel challenged. Ultimately, you may be asked to do this exercise with your right / left foot only.  STRENGTH  Plantar-flexors, Eccentric  Note: This exercise can place a lot of stress on your foot and ankle. Please complete this exercise only if specifically instructed by your caregiver.  Place the balls of your feet on a step. With your hands, use only enough support from a wall or rail to keep your balance. Keep your knees straight and rise up on your toes. Slowly shift your weight entirely to your right / left toes and pick up your opposite foot. Gently and with controlled movement, lower your weight through your right / left foot so that your heel drops below the level of the step. You will feel a slight stretch in the back of your calf at the end position. Use the healthy leg to help rise up onto the balls of both feet, then lower weight only on the right / left leg again. Build up to 15 repetitions. Then progress to 3 consecutive sets of 15 repetitions.* After completing the above exercise, complete the same exercise with a slight knee bend (about 30 degrees). Again, build up to 15 repetitions. Then progress to 3 consecutive sets of 15 repetitions.* Perform this exercise 2 times per day.  *When you easily complete 3 sets of 15, your physician, physical therapist or athletic trainer may advise you to add resistance by wearing a backpack filled with additional weight.  STRENGTH - Plantar Flexors, Seated  Sit on a chair that allows your feet to rest flat on the ground. If necessary, sit at the edge of the chair. Keeping your toes firmly on the ground, lift your right / left heel as far as you can without  increasing any discomfort in your ankle. Repeat 3 times. Complete this exercise 2 times a day.       Look for Voltaren gel at the pharmacy over the counter or online (also known as diclofenac 1% gel). Apply to the painful areas 3-4x daily with the supplied dosing card. Allow to dry for 10 minutes before going into socks/shoes

## 2023-04-28 ENCOUNTER — Other Ambulatory Visit: Payer: Self-pay | Admitting: Family Medicine

## 2023-04-28 ENCOUNTER — Encounter: Payer: Self-pay | Admitting: Podiatry

## 2023-04-28 ENCOUNTER — Ambulatory Visit: Payer: 59 | Attending: Physician Assistant | Admitting: Physician Assistant

## 2023-04-28 DIAGNOSIS — M170 Bilateral primary osteoarthritis of knee: Secondary | ICD-10-CM

## 2023-04-28 MED ORDER — LIDOCAINE HCL 1 % IJ SOLN
1.5000 mL | INTRAMUSCULAR | Status: AC | PRN
  Administered 2023-04-28: 1.5 mL

## 2023-04-28 MED ORDER — PREDNISONE 5 MG PO TABS: ORAL_TABLET | ORAL | 0 refills | Status: AC

## 2023-04-28 MED ORDER — SODIUM HYALURONATE (VISCOSUP) 20 MG/2ML IX SOSY
20.0000 mg | PREFILLED_SYRINGE | INTRA_ARTICULAR | Status: AC | PRN
  Administered 2023-04-28: 20 mg via INTRA_ARTICULAR

## 2023-04-28 NOTE — Progress Notes (Signed)
  Subjective:  Patient ID: Erica Fox, female    DOB: Mar 13, 1986,  MRN: [id number]  Chief Complaint  Patient presents with   Foot Pain    My left foot is hurting on the side.    38 y.o. female presents with the above complaint. History confirmed with patient.  She returns today with a new issue her surgical site and toes are doing well she notes some pain that is going from the side and back of the heel and pulling up into the leg.  Objective:  Physical Exam: warm, good capillary refill, no trophic changes or ulcerative lesions, normal DP and PT pulses, normal sensory exam, and well-healed surgical scars from subtalar fusion and hammertoe correction of left foot she has pain in the mid substance of the Achilles tendon on the left with gastrocnemius equinus noted, she does not have any pain at the insertion.   Radiographs: Multiple views x-ray of the left foot: No complication of surgical sites or hardware there is a small posterior calcaneal enthesophyte Assessment:   1. Achilles tendinitis of left lower extremity      Plan:  Patient was evaluated and treated and all questions answered.  Discussed the etiology and treatment options for Achilles tendinitis including stretching, formal physical therapy with an eccentric exercises therapy plan, supportive shoegears such as a running shoe or sneaker, heel lifts, topical and oral medications.  We also discussed that I do not routinely perform injections in this area because of the risk of an increased damage or rupture of the tendon.  We also discussed the role of surgical treatment of this for patients who do not improve after exhausting non-surgical treatment options.  -XR reviewed with patient -Educated on stretching and icing of the affected limb. -Rx for Medrol  6-day taper. Advised on risks, benefits, and alternatives of the medication   No follow-ups on file.

## 2023-04-28 NOTE — Progress Notes (Signed)
   Procedure Note  Patient: Erica Fox             Date of Birth: 12-21-1985           MRN: [id number]             Visit Date: 04/28/2023  Procedures: Visit Diagnoses:  1. Primary osteoarthritis of both knees     Euflexxa #2 bilateral knees, B/B Large Joint Inj: bilateral knee on 04/28/2023 3:03 PM Indications: pain Details: 27 G 1.5 in needle, medial approach  Arthrogram: No  Medications (Right): 1.5 mL lidocaine  1 %; 20 mg Sodium Hyaluronate (Viscosup) 20 MG/2ML Aspirate (Right): 0 mL Medications (Left): 1.5 mL lidocaine  1 %; 20 mg Sodium Hyaluronate (Viscosup) 20 MG/2ML Aspirate (Left): 0 mL Outcome: tolerated well, no immediate complications Procedure, treatment alternatives, risks and benefits explained, specific risks discussed. Consent was given by the patient.      Patient tolerated the procedures well.  Procedure notes were completed above.

## 2023-04-29 ENCOUNTER — Other Ambulatory Visit (HOSPITAL_COMMUNITY): Payer: Self-pay

## 2023-04-29 ENCOUNTER — Other Ambulatory Visit: Payer: Self-pay

## 2023-04-29 ENCOUNTER — Encounter: Payer: Self-pay | Admitting: Pharmacist

## 2023-04-29 MED ORDER — SERTRALINE HCL 50 MG PO TABS
50.0000 mg | ORAL_TABLET | Freq: Every day | ORAL | 2 refills | Status: DC
  Filled 2023-04-29: qty 30, 30d supply, fill #0

## 2023-04-29 NOTE — Telephone Encounter (Signed)
 Looks like this will be her first fill w you

## 2023-04-29 NOTE — Telephone Encounter (Signed)
 Called pt and she reports she has been on it for a little over a month now

## 2023-05-03 ENCOUNTER — Encounter: Payer: Self-pay | Admitting: Radiology

## 2023-05-05 ENCOUNTER — Ambulatory Visit: Payer: 59 | Attending: Physician Assistant | Admitting: Physician Assistant

## 2023-05-05 DIAGNOSIS — M170 Bilateral primary osteoarthritis of knee: Secondary | ICD-10-CM | POA: Diagnosis not present

## 2023-05-05 MED ORDER — SODIUM HYALURONATE (VISCOSUP) 20 MG/2ML IX SOSY
20.0000 mg | PREFILLED_SYRINGE | INTRA_ARTICULAR | Status: AC | PRN
  Administered 2023-05-05: 20 mg via INTRA_ARTICULAR

## 2023-05-05 MED ORDER — LIDOCAINE HCL 1 % IJ SOLN
1.5000 mL | INTRAMUSCULAR | Status: AC | PRN
  Administered 2023-05-05: 1.5 mL

## 2023-05-05 NOTE — Progress Notes (Signed)
   Procedure Note  Patient: Erica Fox             Date of Birth: 1985-04-29           MRN: [id number]             Visit Date: 05/05/2023  Procedures: Visit Diagnoses:  1. Primary osteoarthritis of both knees    Euflexxa #3 bilateral knees, B/B Large Joint Inj: bilateral knee on 05/05/2023 2:55 PM Indications: pain Details: 27 G 1.5 in needle, medial approach  Arthrogram: No  Medications (Right): 1.5 mL lidocaine 1 %; 20 mg Sodium Hyaluronate (Viscosup) 20 MG/2ML Aspirate (Right): 0 mL Medications (Left): 1.5 mL lidocaine 1 %; 20 mg Sodium Hyaluronate (Viscosup) 20 MG/2ML Aspirate (Left): 0 mL Outcome: tolerated well, no immediate complications Procedure, treatment alternatives, risks and benefits explained, specific risks discussed. Consent was given by the patient. Immediately prior to procedure a time out was called to verify the correct patient, procedure, equipment, support staff and site/side marked as required. Patient was prepped and draped in the usual sterile fashion.      Patient tolerated the procedures well.  Aftercare was discussed.  Sherron Ales, PA-C

## 2023-05-11 ENCOUNTER — Telehealth: Payer: Self-pay | Admitting: Radiology

## 2023-05-11 NOTE — Telephone Encounter (Signed)
 Patient no show to her ultrasound appointment and has not rescheduled. Two message have been left and a letter mailed for patient to call and reschedule; patient has not called back.

## 2023-05-17 ENCOUNTER — Other Ambulatory Visit: Payer: Self-pay | Admitting: Rheumatology

## 2023-05-17 DIAGNOSIS — M0609 Rheumatoid arthritis without rheumatoid factor, multiple sites: Secondary | ICD-10-CM

## 2023-05-17 DIAGNOSIS — Z79899 Other long term (current) drug therapy: Secondary | ICD-10-CM

## 2023-05-17 NOTE — Telephone Encounter (Signed)
 Last Fill: 02/28/2023  Labs: 03/03/2023 CBC and CMP WNL.   Next Visit: 06/01/2023  Last Visit: 03/03/2023  DX: Rheumatoid arthritis of multiple sites with negative rheumatoid factor   Current Dose per office note 03/03/2023: Rasuvo 25 mg sq injections once weekly   Okay to refill Rasuvo?

## 2023-05-18 NOTE — Progress Notes (Unsigned)
 Office Visit Note  Patient: Erica Fox             Date of Birth: 1985-04-29           MRN: [id number]             PCP: Avanell Shackleton, NP-C Referring: Avanell Shackleton, NP-C Visit Date: 06/01/2023 Occupation: @GUAROCC @  Subjective:  Bilateral knee pain   History of Present Illness: Erica Fox is a 38 y.o. female with history seronegative rheumatoid arthritis and osteoarthritis.  Patient is taking Rinvoq 15 mg 1 tablet by mouth daily, Rasuvo 25 mg sq injections once weekly, and folic acid 2 mg daily.  She is tolerating combination therapy without any side effects and has not had any recent gaps in therapy.  She denies any recent or recurrent infections.  Patient states that she is having severe pain involving both knees despite undergoing viscosupplementation in February 2025.  Patient currently rates her pain a 9 out of 10.  Her symptoms are exacerbated by climbing steps and having to stand or walk for prolonged periods of time.  Patient states that she has had a 0% improvement in her symptoms since undergoing viscosupplementation.  Patient states that at times she notices some inflammation in the left knee.  Patient states that the other day her right knee buckled to the point she almost had a fall.  Patient is no longer following up with orthopedics.  Patient states that her knee joint pain is not responsive to Tylenol or Aspercreme use. She has been using a heated blanket at night.  Patient states that overall rest of her joints have been well-controlled on the current treatment regimen.   Activities of Daily Living:  Patient reports morning stiffness for all day. Patient Reports nocturnal pain.  Difficulty dressing/grooming: Reports Difficulty climbing stairs: Reports Difficulty getting out of chair: Reports Difficulty using hands for taps, buttons, cutlery, and/or writing: Reports  Review of Systems  Constitutional:  Positive for fatigue.  HENT:  Negative for mouth sores, mouth  dryness and nose dryness.   Eyes:  Negative for pain and dryness.  Respiratory:  Negative for shortness of breath and difficulty breathing.   Cardiovascular:  Negative for chest pain and palpitations.  Gastrointestinal:  Negative for blood in stool, constipation and diarrhea.  Endocrine: Negative for increased urination.  Genitourinary:  Negative for involuntary urination.  Musculoskeletal:  Positive for joint pain, gait problem, joint pain, joint swelling and morning stiffness. Negative for myalgias, muscle weakness, muscle tenderness and myalgias.  Skin:  Negative for color change, rash, hair loss and sensitivity to sunlight.  Allergic/Immunologic: Negative for susceptible to infections.  Neurological:  Positive for headaches. Negative for dizziness.  Hematological:  Negative for swollen glands.  Psychiatric/Behavioral:  Positive for depressed mood. Negative for sleep disturbance. The patient is nervous/anxious.     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   left eye, dx by opthalmology   Degenerative joint disease    OCD (obsessive compulsive disorder) 2025   per patient   PTSD (post-traumatic stress disorder) 2025   per patient   Rheumatoid arthritis (HCC)     Family History  Problem Relation Age of Onset   Heart disease Mother    Breast cancer Paternal Aunt    Healthy Son    Healthy Son    Past Surgical History:  Procedure Laterality Date   CESAREAN SECTION     2007, 2012   EYE SURGERY Right    age 34, tighten muscle for lazy eye   FOOT SURGERY Left    hammertoe correction & screws placed   FOOT SURGERY Right 05/07/2022   hammertoe  correction   TUBAL LIGATION     WISDOM TOOTH EXTRACTION  2014   Social History   Social History Narrative   Not on file    There is no immunization history on file for this patient.   Objective: Vital Signs: BP 123/80 (BP Location: Left Arm, Patient Position: Sitting, Cuff Size: Large)   Pulse 67   Resp 14   Ht 5\' 8"  (1.727 m)   Wt 278 lb 12.8 oz (126.5 kg)   BMI 42.39 kg/m    Physical Exam Vitals and nursing note reviewed.  Constitutional:      Appearance: She is well-developed.  HENT:     Head: Normocephalic and atraumatic.  Eyes:     Conjunctiva/sclera: Conjunctivae normal.  Cardiovascular:     Rate and Rhythm: Normal rate and regular rhythm.     Heart sounds: Normal heart sounds.  Pulmonary:     Effort: Pulmonary effort is normal.     Breath sounds: Normal breath sounds.  Abdominal:     General: Bowel sounds are normal.     Palpations: Abdomen is soft.  Musculoskeletal:     Cervical back: Normal range of motion.  Lymphadenopathy:     Cervical: No cervical adenopathy.  Skin:    General: Skin is warm and dry.     Capillary Refill: Capillary refill takes less than 2 seconds.  Neurological:     Mental Status: She is alert and oriented to person, place, and time.  Psychiatric:        Behavior: Behavior normal.      Musculoskeletal Exam: C-spine has slightly limited range of motion with lateral rotation.  Shoulder joints have good range of motion.  Elbow joints have good range of motion with no tenderness along the joint line.  Severely limited range of motion of both wrist joints with mild warmth of the right wrist.  Synovial thickening of all MCP joints and PIP joints.  No synovitis noted.  Painful range of motion of both knees with crepitus bilaterally.  Mild warmth in the left knee.  No knee joint effusions noted.  Ankle joints have limited range of motion particularly in the left ankle.  CDAI Exam: CDAI Score: -- Patient Global: --; Provider Global:  -- Swollen: 0 ; Tender: 2  Joint Exam 06/01/2023      Right  Left  Knee   Tender   Tender     Investigation: No additional findings.  Imaging: No results found.   Recent Labs: Lab Results  Component Value Date   WBC 7.7 03/03/2023   HGB 13.0 03/03/2023   PLT 221 03/03/2023   NA 138 03/03/2023   K 4.3 03/03/2023   CL 107 03/03/2023   CO2 25 03/03/2023   GLUCOSE 88 03/03/2023   BUN 13 03/03/2023   CREATININE 0.62 03/03/2023   BILITOT 0.6 03/03/2023   ALKPHOS 66 03/11/2022   AST 14 03/03/2023   ALT 12 03/03/2023   PROT 7.1 03/03/2023   ALBUMIN 4.8 03/11/2022  CALCIUM 9.4 03/03/2023   QFTBGOLDPLUS NEGATIVE 07/22/2022    Speciality Comments: Enbrel-discontinued Humira-inadequate response Simponi started October 05, 2022 Methotrexate started August 26, 2022  Procedures:  No procedures performed     Assessment / Plan:     Visit Diagnoses: Rheumatoid arthritis of multiple sites with negative rheumatoid factor (HCC) - RF negative, anti-CCP negative, MCV negative, elevated sedimentation rate. JIA at age 78.  Severe erosive rheumatoid arthritis: Patient presents today with acute on chronic pain involving both knees.  She has discomfort range of motion of both knees with crepitus bilaterally.  Mild warmth in the left knee but no effusion noted.  Patient completed the Euflexxa injection series for both knees on 05/05/2023 and has noticed 0% improvement in her symptoms.  She continues to have significant difficulty climbing steps and is having severe pain affecting her quality of life.  She currently rates the pain in both knees a 9 out of 10.  Her symptoms are not responsive to Tylenol or Aspercreme.  She has been trying to avoid the use of oral steroids as recommended by ophthalmology. Plan to check sed rate and CRP today. Overall her rheumatoid arthritis is significantly better controlled taking Rinvoq 15 mg 1 tablet by mouth daily and Rasuvo 25 mg subcu days injections once  weekly.  Rinvoq was initiated on 01/08/2023.  She has been tolerating combination therapy without any side effects and has not had any recent gaps in therapy.  Discussed the hesitancy to make any medication changes since she has no active synovitis.  Due to the severity and chronicity of pain involving both knees different treatment options were discussed today.  A referral to orthopedics will be placed for further evaluation and a referral to pain management will be placed to discuss treatment options. In the meantime she has requested a prednisone taper.  Plan to prescribe a low-dose short taper starting at 10 mg tapering by 2.5 mg every 4 days. Also discussed the option of switching from Zoloft to Cymbalta to help alleviate her current pain levels.  Patient plans on reaching out to her PCP to discuss this medication change in detail. Discussed that if her knee joint pain is felt to be due to underlying rheumatoid arthritis we could consider adding Plaquenil as combination therapy--she was given a handout of information about Plaquenil to review.  For now she will remain on room both and Rasuvo as combination therapy.  She will follow-up in the office in 6 weeks or sooner if needed.  - Plan: Ambulatory referral to Physical Medicine Rehab  High risk medication use - Rinvoq 15 mg 1 tablet by mouth daily, Rasuvo 25 mg sq injections once weekly, and folic acid 2 mg daily.Patient initiated Rinvoq on 01/08/2023. No recent gaps in therapy.  CBC and CMP updated on 03/03/23.  Orders for CBC and CMP were released today. TB gold negative on 07/22/22.  Future order for TB gold placed today. Lipid panel updated on 03/03/2023. No recent or recurrent infections. Discussed the importance of holding rinvoq and rasuvo if she develops signs or symptoms of an infection and to resume once the infection has completely cleared.   - Plan: CBC with Differential/Platelet, COMPLETE METABOLIC PANEL WITH GFR, QuantiFERON-TB Gold  Plus  Screening for tuberculosis -Future order for TB gold placed today.  Plan: QuantiFERON-TB Gold Plus  Effusion, left knee: No effusion noted on examination today.  Primary osteoarthritis of both knees -Chronic pain.  Patient x-rays of both knees on 03/19/2022 and an updated  x-ray of the left knee on 07/08/2022.  Patient has not had a knee MRI in the past.  She had a left knee joint aspiration and cortisone injection performed on 01/18/2023.  She underwent visco-supplementation for both knees at the end of january/early february 2025 but has noticed 0% improvement in her symptoms.  She currently rates the pain in both knees a 9 out of 10.  Her knee joint pain is exacerbated by climbing steps as well as standing or walking for prolonged periods of time.  Several days ago she had a buckling sensation in the right knee to the point she almost fell.  She has noticed intermittent warmth in the left knee.  No recurrence of a knee joint effusion.  On examination she has painful range of motion of both knees with mild warmth in the left knee.  Bilateral knee crepitus noted on exam. Different treatment options were discussed today in detail.  Overall her rheumatoid arthritis appears well-controlled taking Rinvoq and Rasuvo as prescribed--no recent effusion--her last aspiration was performed on 01/18/2023.  She continues to have significant pain involving both knees despite trying cortisone and viscosupplementation.  Her bilateral knee pain has been interfering with her quality of life to the point that she has been unable to perform activities that she enjoys. Plan to check sed rate and CRP for further evaluation.  She may require an MRI to assess for an internal derangement versus active synovitis. Referral to Dr. August Saucer for further evaluation and management was placed today.  Referral to pain management was also placed to discuss treatment options. Also recommended following up with her PCP to discuss switching  from Zoloft to Cymbalta. - Plan: Ambulatory referral to Physical Medicine Rehab, AMB referral to orthopedics  Pain in both hands -Severely limited range of motion of both wrist joints noted.  Synovial thickening of MCP and PIP joints.  No synovitis noted.  Plan: Ambulatory referral to Physical Medicine Rehab  Pain in both feet - Left achilles tendonitis-under the care of Dr. Hosie Spangle office visit note from 05/17/2023 -treated with a course of Medrol 6-day taper.  Plan: Ambulatory referral to Physical Medicine Rehab  JIA (juvenile idiopathic arthritis) (HCC) - Diagnosed at age 23.  Treated with different DMARDs and Biologics until age 88.  She stopped methotrexate and Enbrel at age 44  History of depression: Patient was started on Zoloft about 2 months ago.  She is been tolerating Zoloft without any side effects but has not noticed any clinical benefit.  Patient continues to notice mood changes particularly with the level of chronic pain she has been experiencing.  Her quality of life has been significantly affected by her level of pain and difficulty performing activities.  Discussed that she may benefit from switching from Zoloft to Cymbalta.  Patient plans on reaching out to her PCP today to discuss making this medication change.    Other medical conditions are listed as follows:  Elevated sed rate  IgA deficiency (HCC)  Seasonal allergies  Other fatigue: Chronic   Vitamin D deficiency: Vitamin D was 33 on 03/03/2023.  PTSD (post-traumatic stress disorder): Recently diagnosed-currently seeing a specialist.  History of ADHD: Recently diagnosed and is currently seeing a specialist.  Orders: Orders Placed This Encounter  Procedures   CBC with Differential/Platelet   COMPLETE METABOLIC PANEL WITH GFR   QuantiFERON-TB Gold Plus   Sedimentation rate   Ambulatory referral to Physical Medicine Rehab   AMB referral to orthopedics   Meds ordered this encounter  Medications    predniSONE (DELTASONE) 5 MG tablet    Sig: Take 2 tabs po qd x 4 days, 1.5  tabs po qd x 4 days, 1 tab po qd x 4 days, 1/2  tab po qd x 4 days    Dispense:  20 tablet    Refill:  0    Follow-Up Instructions: Return in about 6 weeks (around 07/13/2023).   Gearldine Bienenstock, PA-C  Note - This record has been created using Dragon software.  Chart creation errors have been sought, but may not always  have been located. Such creation errors do not reflect on  the standard of medical care.

## 2023-05-29 IMAGING — MR MR ANKLE*L* W/O CM
4 of 5 series · 13 of 40 positions shown · non-contrast
Comparison: Radiographs 10/15/2000

CLINICAL DATA: Lateral foot pain.

EXAM:
MRI OF THE LEFT ANKLE WITHOUT CONTRAST
TECHNIQUE: Multiplanar, multisequence MR imaging of the ankle was performed. No
intravenous contrast was administered.

[Series 3: PD fat-sat · axial · left · 3.0mm · 0.28mm/px · z∈[-73,+23]mm · 4 of 30 slices shown]
[im 1/30]
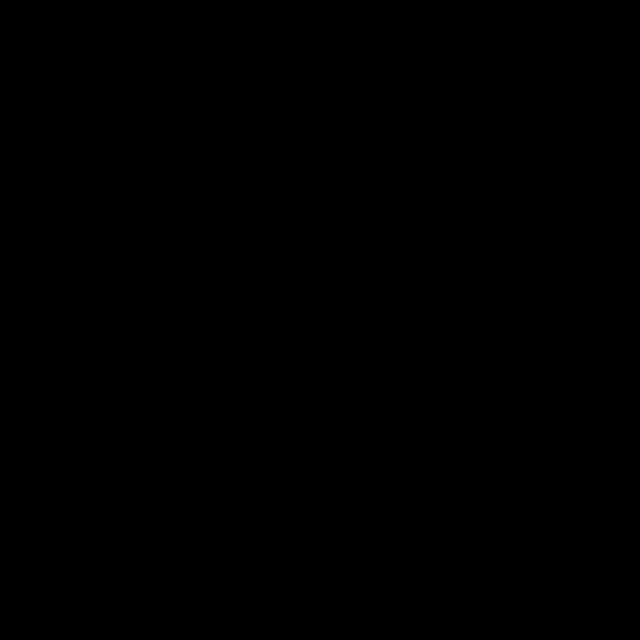
[im 5/30]
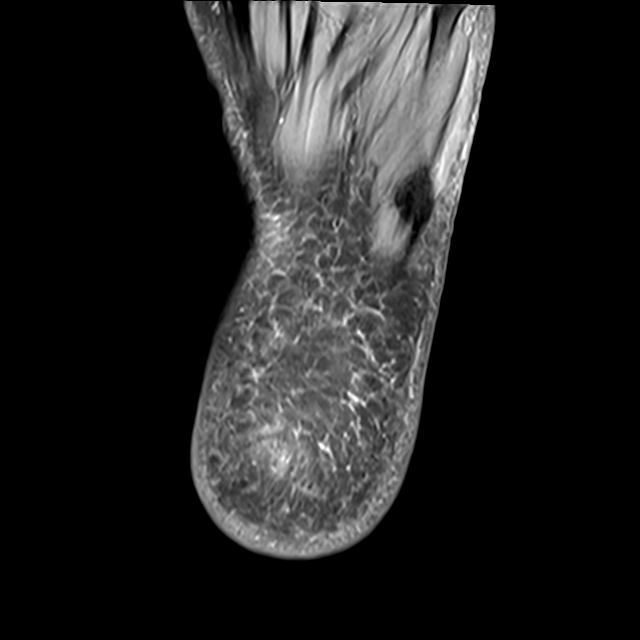
[im 17/30]
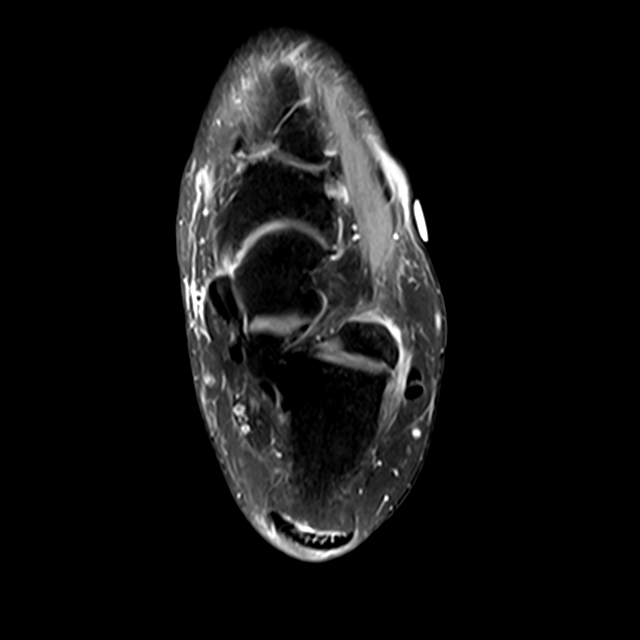
[im 25/30]
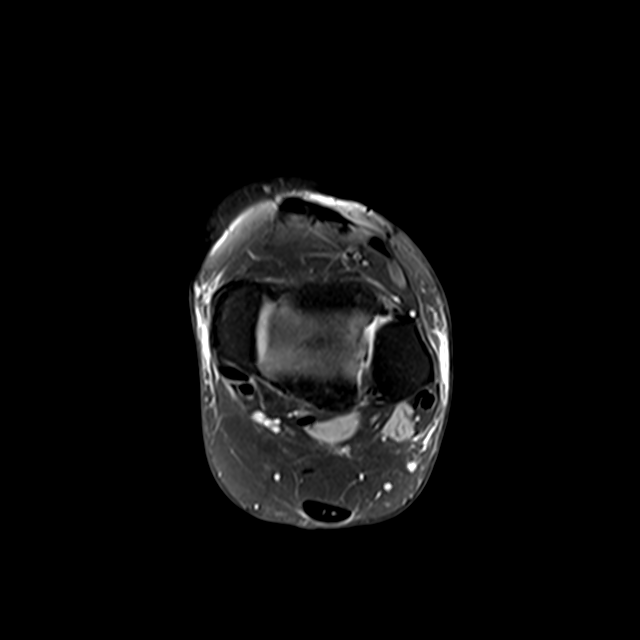

[Series 4: T2 fat-sat · axial · left · 3.0mm · 0.28mm/px · z∈[-57,+23]mm · 3 of 30 slices shown (1 of 2)]
[im 5/30]
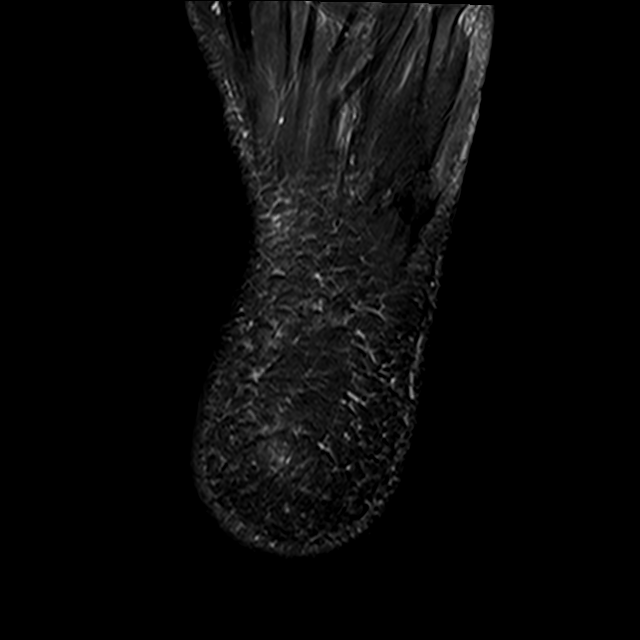
[im 17/30]
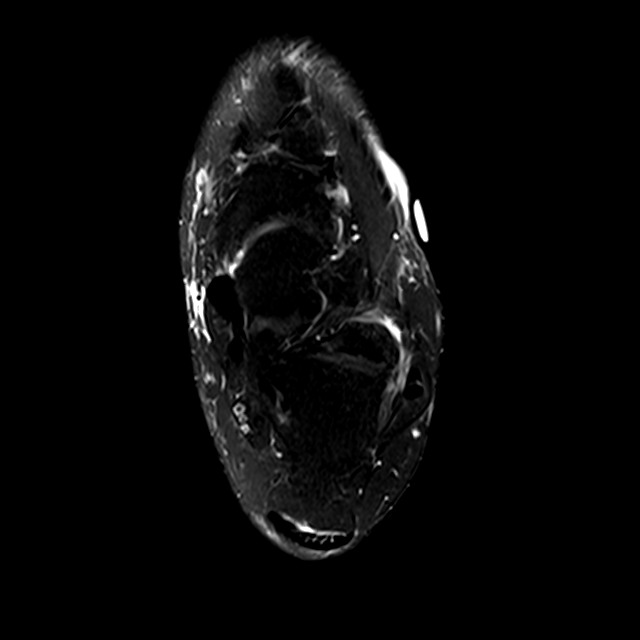
[im 25/30]
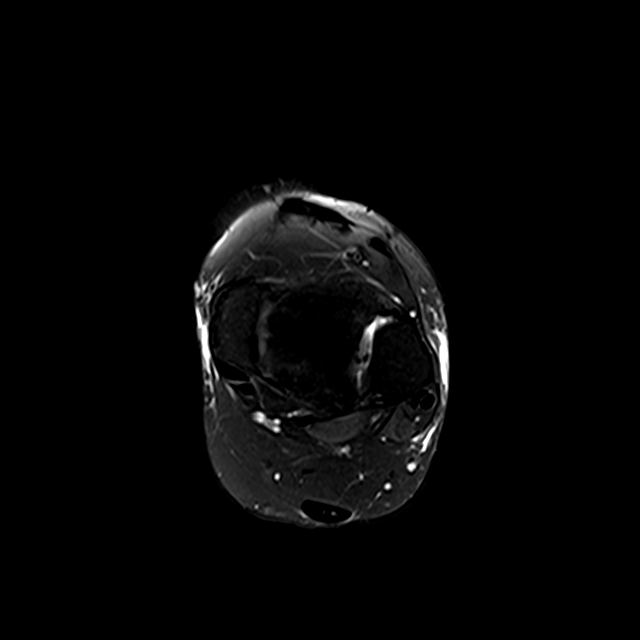

[Series 5: T1 · sagittal · left · 4.0mm · 0.27mm/px · 3 of 24 slices shown]
[im 4/24]
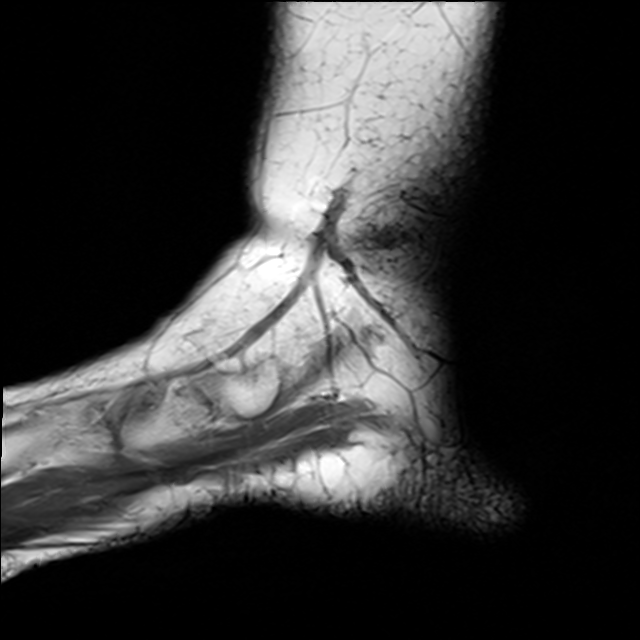
[im 12/24]
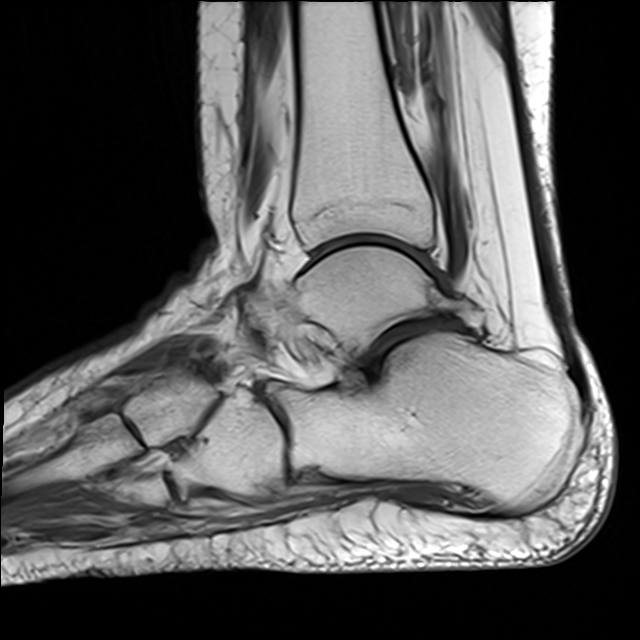
[im 20/24]
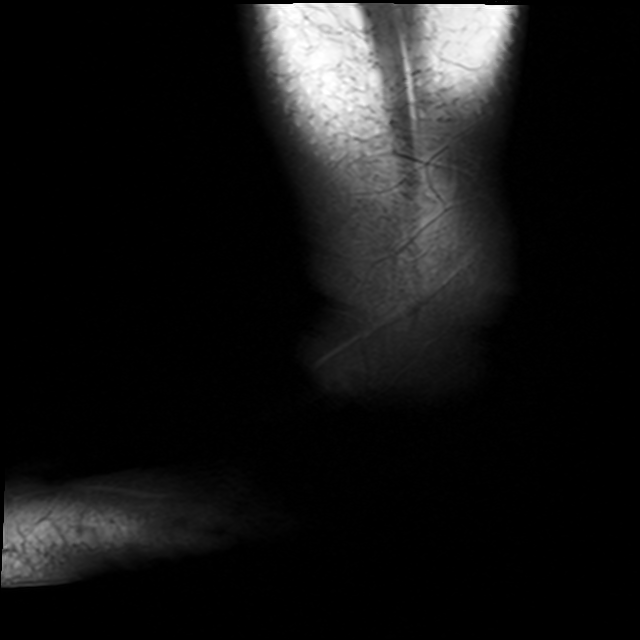

[Series 7: T2 fat-sat · coronal · left · 3.0mm · 0.25mm/px · 3 of 40 slices shown (2 of 2)]
[im 4/40]
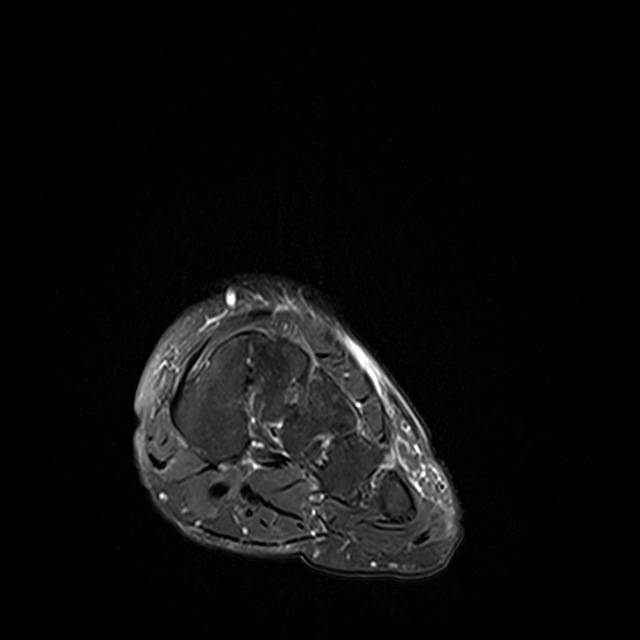
[im 20/40]
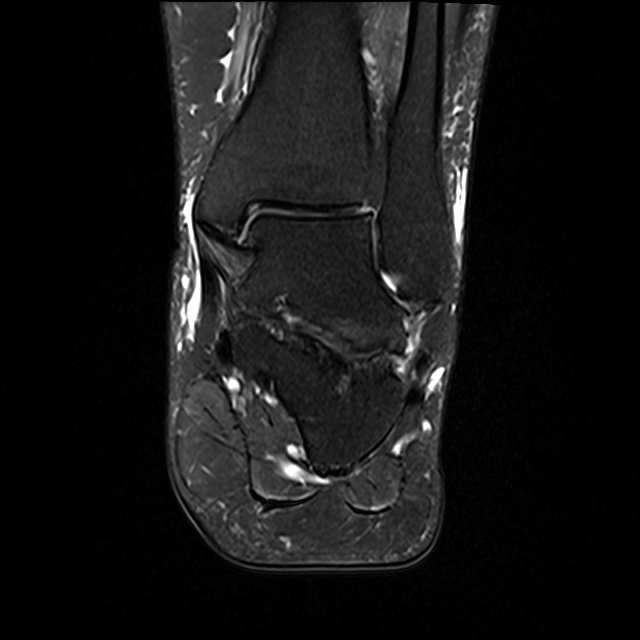
[im 36/40]
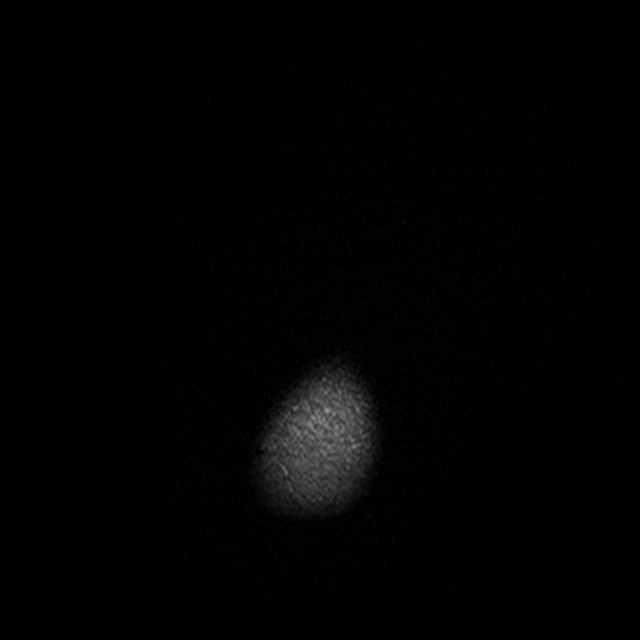

[13 of 40 positions shown; findings below may reference images not displayed]

FINDINGS: TENDONS

Peroneal: Intact

Posteromedial: Intact

Anterior: Intact

Achilles: Intact.  Mild tendinopathy with small interstitial tears.

Plantar Fascia: Intact.  No findings to suggest plantar fasciitis.

LIGAMENTS

Lateral: Intact

Medial: Intact

CARTILAGE

Ankle Joint: Early degenerative changes. No joint effusion or
osteochondral lesion.

Subtalar Joints/Sinus Tarsi: Findings suspicious for tarsal
coalition. There appears to be bony coalition between the talus and
the anterior process the calcaneus and also between the anterior
process of the calcaneus and the navicular bone. Possible subtle
coalition involving the middle talocalcaneal facet with age advanced
degenerative changes. CT may be helpful for further evaluation in
this patient.

Mild posterior subtalar joint degenerative changes. There is an
elongated and slightly irregular os trigonum. The sinus tarsi is
unremarkable. No inflammation or fluid. The cervical and
interosseous ligaments are intact and the spring ligament is intact.

Bones: No acute bony findings. No stress fracture or AVN. Pes planus
noted.

Other: Unremarkable foot and ankle musculature.
IMPRESSION: 1. Findings suspicious for tarsal coalition as described above. CT
may be helpful for further evaluation in this patient.
2. Intact medial and lateral ankle ligaments and tendons.
3. Mild Achilles tendinopathy with small interstitial tears.
4. Early tibiotalar and subtalar joint degenerative changes.

## 2023-05-30 NOTE — Telephone Encounter (Signed)
 Call placed to patient.  Spoke with patient, call was disconnected after introduction.   Call returned to patient, no answer. Left detailed message, ok per dpr. Advised f/u on PUS ordered for LLQ pain. Please return call to provide update on symptoms to determine if PUS still needed. [phone], opt 4.

## 2023-06-01 ENCOUNTER — Encounter: Payer: Self-pay | Admitting: Family Medicine

## 2023-06-01 ENCOUNTER — Encounter: Payer: Self-pay | Admitting: Physician Assistant

## 2023-06-01 ENCOUNTER — Ambulatory Visit: Payer: Commercial Managed Care - PPO | Attending: Physician Assistant | Admitting: Physician Assistant

## 2023-06-01 VITALS — BP 123/80 | HR 67 | Resp 14 | Ht 68.0 in | Wt 278.8 lb

## 2023-06-01 DIAGNOSIS — M0880 Other juvenile arthritis, unspecified site: Secondary | ICD-10-CM

## 2023-06-01 DIAGNOSIS — R700 Elevated erythrocyte sedimentation rate: Secondary | ICD-10-CM

## 2023-06-01 DIAGNOSIS — M79672 Pain in left foot: Secondary | ICD-10-CM

## 2023-06-01 DIAGNOSIS — R5383 Other fatigue: Secondary | ICD-10-CM

## 2023-06-01 DIAGNOSIS — Z79899 Other long term (current) drug therapy: Secondary | ICD-10-CM | POA: Diagnosis not present

## 2023-06-01 DIAGNOSIS — M170 Bilateral primary osteoarthritis of knee: Secondary | ICD-10-CM

## 2023-06-01 DIAGNOSIS — Z111 Encounter for screening for respiratory tuberculosis: Secondary | ICD-10-CM

## 2023-06-01 DIAGNOSIS — M79642 Pain in left hand: Secondary | ICD-10-CM

## 2023-06-01 DIAGNOSIS — M79641 Pain in right hand: Secondary | ICD-10-CM

## 2023-06-01 DIAGNOSIS — M25462 Effusion, left knee: Secondary | ICD-10-CM

## 2023-06-01 DIAGNOSIS — D802 Selective deficiency of immunoglobulin A [IgA]: Secondary | ICD-10-CM

## 2023-06-01 DIAGNOSIS — F4310 Post-traumatic stress disorder, unspecified: Secondary | ICD-10-CM

## 2023-06-01 DIAGNOSIS — M0609 Rheumatoid arthritis without rheumatoid factor, multiple sites: Secondary | ICD-10-CM

## 2023-06-01 DIAGNOSIS — J302 Other seasonal allergic rhinitis: Secondary | ICD-10-CM

## 2023-06-01 DIAGNOSIS — E559 Vitamin D deficiency, unspecified: Secondary | ICD-10-CM

## 2023-06-01 DIAGNOSIS — Z8659 Personal history of other mental and behavioral disorders: Secondary | ICD-10-CM

## 2023-06-01 DIAGNOSIS — M79671 Pain in right foot: Secondary | ICD-10-CM

## 2023-06-01 MED ORDER — PREDNISONE 5 MG PO TABS: ORAL_TABLET | ORAL | 0 refills | Status: DC

## 2023-06-02 ENCOUNTER — Other Ambulatory Visit: Payer: Self-pay | Admitting: Family Medicine

## 2023-06-02 MED ORDER — DULOXETINE HCL 30 MG PO CPEP: 30.0000 mg | ORAL_CAPSULE | Freq: Every day | ORAL | 1 refills | Status: DC

## 2023-06-02 NOTE — Telephone Encounter (Signed)
 Pt spoke w rheumatologist and they recommend a switch from setraline to cymbalta due to future medication not mixing well. If you are ok w this, she would like for you to send in as she is due for a refill

## 2023-06-02 NOTE — Progress Notes (Signed)
CBC and CMP WNL.  ESR WNL.

## 2023-06-03 LAB — COMPLETE METABOLIC PANEL WITH GFR
AG Ratio: 1.5 (calc) (ref 1.0–2.5)
ALT: 20 U/L (ref 6–29)
AST: 23 U/L (ref 10–30)
Albumin: 4.5 g/dL (ref 3.6–5.1)
Alkaline phosphatase (APISO): 62 U/L (ref 31–125)
BUN: 9 mg/dL (ref 7–25)
CO2: 28 mmol/L (ref 20–32)
Calcium: 9.4 mg/dL (ref 8.6–10.2)
Chloride: 105 mmol/L (ref 98–110)
Creat: 0.68 mg/dL (ref 0.50–0.97)
Globulin: 3.1 g/dL (ref 1.9–3.7)
Glucose, Bld: 89 mg/dL (ref 65–99)
Potassium: 4.1 mmol/L (ref 3.5–5.3)
Sodium: 137 mmol/L (ref 135–146)
Total Bilirubin: 0.5 mg/dL (ref 0.2–1.2)
Total Protein: 7.6 g/dL (ref 6.1–8.1)
eGFR: 114 mL/min/{1.73_m2} (ref >=60)

## 2023-06-03 LAB — CBC WITH DIFFERENTIAL/PLATELET
Absolute Lymphocytes: 2878 {cells}/uL (ref 850–3900)
Absolute Monocytes: 590 {cells}/uL (ref 200–950)
Basophils Absolute: 26 {cells}/uL (ref 0–200)
Basophils Relative: 0.3 %
Eosinophils Absolute: 158 {cells}/uL (ref 15–500)
Eosinophils Relative: 1.8 %
HCT: 37.8 % (ref 35.0–45.0)
Hemoglobin: 12.6 g/dL (ref 11.7–15.5)
MCH: 31.6 pg (ref 27.0–33.0)
MCHC: 33.3 g/dL (ref 32.0–36.0)
MCV: 94.7 fL (ref 80.0–100.0)
MPV: 10.6 fL (ref 7.5–12.5)
Monocytes Relative: 6.7 %
Neutro Abs: 5148 {cells}/uL (ref 1500–7800)
Neutrophils Relative %: 58.5 %
Platelets: 264 10*3/uL (ref 140–400)
RBC: 3.99 10*6/uL (ref 3.80–5.10)
RDW: 12.8 % (ref 11.0–15.0)
Total Lymphocyte: 32.7 %
WBC: 8.8 10*3/uL (ref 3.8–10.8)

## 2023-06-03 LAB — TEST AUTHORIZATION

## 2023-06-03 LAB — SEDIMENTATION RATE: Sed Rate: 19 mm/h (ref 0–20)

## 2023-06-03 LAB — C-REACTIVE PROTEIN: CRP: <3 mg/L (ref <8.0)

## 2023-06-03 NOTE — Progress Notes (Signed)
 CRP within normal limits--reassuring.    Please clarify if she has received a call from Ortho care to schedule with Dr. August Saucer for further evaluation of bilateral knee pain

## 2023-06-13 ENCOUNTER — Other Ambulatory Visit: Payer: Self-pay | Admitting: Physician Assistant

## 2023-06-13 DIAGNOSIS — M0609 Rheumatoid arthritis without rheumatoid factor, multiple sites: Secondary | ICD-10-CM

## 2023-06-13 DIAGNOSIS — Z79899 Other long term (current) drug therapy: Secondary | ICD-10-CM

## 2023-06-13 NOTE — Telephone Encounter (Signed)
 Last Fill: 03/22/2023  Labs: 06/01/2023 CBC and CMP WNL   TB Gold: 07/22/2022 Neg    Next Visit: 07/13/2023  Last Visit: 06/01/2023  DX: Rheumatoid arthritis of multiple sites with negative rheumatoid factor   Current Dose per office note 06/01/2023: Rinvoq 15 mg 1 tablet by mouth daily   Okay to refill Rinvoq?

## 2023-06-15 ENCOUNTER — Other Ambulatory Visit (INDEPENDENT_AMBULATORY_CARE_PROVIDER_SITE_OTHER): Payer: Self-pay

## 2023-06-15 ENCOUNTER — Ambulatory Visit: Admitting: Orthopedic Surgery

## 2023-06-15 DIAGNOSIS — M25561 Pain in right knee: Secondary | ICD-10-CM | POA: Diagnosis not present

## 2023-06-15 DIAGNOSIS — G8929 Other chronic pain: Secondary | ICD-10-CM

## 2023-06-15 DIAGNOSIS — M25562 Pain in left knee: Secondary | ICD-10-CM

## 2023-06-16 ENCOUNTER — Encounter: Payer: Self-pay | Admitting: Orthopedic Surgery

## 2023-06-17 ENCOUNTER — Ambulatory Visit: Admitting: Family Medicine

## 2023-06-18 ENCOUNTER — Encounter: Payer: Self-pay | Admitting: Orthopedic Surgery

## 2023-06-18 NOTE — Progress Notes (Signed)
 Office Visit Note   Patient: Erica Fox           Date of Birth: 18-Nov-1985           MRN: [id number] Visit Date: 06/15/2023 Requested by: Gearldine Bienenstock, PA-C [id number] [place],  [place] [address] PCP: Avanell Shackleton, NP-C  Subjective: Chief Complaint  Patient presents with   Left Knee - Pain   Right Knee - Pain    HPI: Erica Fox is a 38 y.o. female who presents to the office reporting bilateral knee pain which is primarily anterior.  Symptoms ongoing for 2 years.  Denies any history of injury.  She is on Biologics for history of rheumatoid arthritis.  She states the inflammation is less but her knees are not feeling any better.  She works in Therap[employer], sports.  Stairs are much more difficult to navigate than flat ground.  She also states her neck and wrist are affected.  Patient struggles with many daily activities.  She has tried a home exercise program of quad strengthening without any relief.  She has also failed cortisone injection as well as gel injection treatment..                ROS: All systems reviewed are negative as they relate to the chief complaint within the history of present illness.  Patient denies fevers or chills.  Assessment & Plan: Visit Diagnoses:  1. Chronic pain of left knee   2. Chronic pain of right knee     Plan: Impression is severe bilateral knee arthritis with significant impact on quality of life.  Has crepitus and typically localizes pain to the anterior aspect of the knee consistent with patellofemoral arthritis.  Radiographic evidence does show arthritis in this compartment.  Patient is young but may potentially be looking at total knee replacement.  Would like to get MRI scans to make sure there is nothing treatable arthroscopically in the knee before embarking on knee replacement.  She has failed an extensive course of nonoperative management including injections activity modification and therapy with home exercise program.   Follow-up after MRI scans.  Follow-Up Instructions: No follow-ups on file.   Orders:  Orders Placed This Encounter  Procedures   XR KNEE 3 VIEW RIGHT   XR Knee 1-2 Views Left   MR Knee Right w/o contrast   MR Knee Left w/o contrast   No orders of the defined types were placed in this encounter.     Procedures: No procedures performed   Clinical Data: No additional findings.  Objective: Vital Signs: There were no vitals taken for this visit.  Physical Exam:  Constitutional: Patient appears well-developed HEENT:  Head: Normocephalic Eyes:EOM are normal Neck: Normal range of motion Cardiovascular: Normal rate Pulmonary/chest: Effort normal Neurologic: Patient is alert Skin: Skin is warm Psychiatric: Patient has normal mood and affect  Ortho Exam: Ortho exam demonstrates no groin pain with internal/external Tatian of the leg.  Significant patellofemoral crepitus is present bilaterally.  Alignment intact.  Pedal pulses palpable.  No masses lymphadenopathy or skin changes noted in that knee region.  Does not have much in the way of medial or lateral joint line tenderness.  No warmth to the knees.  Specialty Comments:  No specialty comments available.  Imaging: No results found.   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   left eye, dx by opthalmology   Degenerative joint disease    OCD (obsessive compulsive disorder) 2025   per patient   PTSD (post-traumatic stress disorder) 2025   per patient   Rheumatoid arthritis (HCC)     Family History  Problem Relation Age of Onset   Heart disease Mother    Breast cancer Paternal Aunt     Healthy Son    Healthy Son     Past Surgical History:  Procedure Laterality Date   CESAREAN SECTION     2007, 2012   EYE SURGERY Right    age 38, tighten muscle for lazy eye   FOOT SURGERY Left    hammertoe correction & screws placed   FOOT SURGERY Right 05/07/2022   hammertoe correction   TUBAL LIGATION     WISDOM TOOTH EXTRACTION  2014   Social History   Occupational History   Not on file  Tobacco Use   Smoking status: Never    Passive exposure: Past   Smokeless tobacco: Never  Vaping Use   Vaping status: Never Used  Substance and Sexual Activity   Alcohol use: Not Currently    Comment: very rarely   Drug use: Not Currently   Sexual activity: Yes    Partners: Male    Birth control/protection: I.U.D., Surgical    Comment: tubal, Mirena, menarche 38yo, sexual debut 38yo

## 2023-06-21 ENCOUNTER — Other Ambulatory Visit: Payer: Self-pay | Admitting: Orthopedic Surgery

## 2023-06-21 NOTE — Telephone Encounter (Signed)
 Please send letter with recommendations for ultrasound. If no response can close.

## 2023-06-21 NOTE — Telephone Encounter (Signed)
 Jami -no response from patient.   AEX scheduled for 03/30/24  Please advise on PUS

## 2023-06-22 ENCOUNTER — Ambulatory Visit
Admission: RE | Admit: 2023-06-22 | Discharge: 2023-06-22 | Discharge status: Home / Self Care | Source: Ambulatory Visit | Attending: Orthopedic Surgery | Admitting: Orthopedic Surgery

## 2023-06-22 DIAGNOSIS — G8929 Other chronic pain: Secondary | ICD-10-CM

## 2023-06-24 NOTE — Telephone Encounter (Signed)
 Letter pended, copy to Jami to review and sign.

## 2023-06-25 ENCOUNTER — Other Ambulatory Visit: Payer: Self-pay | Admitting: Family Medicine

## 2023-06-26 ENCOUNTER — Other Ambulatory Visit

## 2023-06-27 ENCOUNTER — Encounter: Payer: Self-pay | Admitting: Family Medicine

## 2023-06-27 MED ORDER — PREDNISONE 5 MG PO TABS
5.0000 mg | ORAL_TABLET | Freq: Every day | ORAL | 0 refills | Status: DC
Start: 2023-06-27 — End: 2023-07-29

## 2023-06-27 NOTE — Telephone Encounter (Signed)
 Letter mailed to patients address on file.  Copy sent by MyChart.   Encounter closed.

## 2023-06-27 NOTE — Telephone Encounter (Signed)
 I called the patient to discuss the symptoms she is currently experiencing. MRI results have not yet been read.  Reviewed Dr. Diamantina Providence office visit note. Patient is having severe pain in both knees to the point that she is having difficulty performing ADLs and working.  She has been more tearful due to the severity of pain.  Patient is currently taking Cymbalta 30 mg daily prescribed by her PCP.  She is scheduled to follow-up with her PCP next week to discuss the efficacy with Cymbalta.  Suggested reaching out to her PCP to see if she may benefit from increasing to 60 mg daily to help alleviate pain. Patient has not yet heard back from the pain management referral placed.    Please call to check on the status of this referral to see if scheduling the patient can be expedited. Offered a prescription for tramadol for acute pain relief but she declined since tramadol has not been overly effective in the past.  Offered to send in another prednisone taper or a maintenance dose of prednisone 5 mg daily.  Patient is willing to try prednisone 5 mg 1 tablet daily.  Please pend a prescription for prednisone 5 mg 1 tablet by mouth daily.  Dispense 30 tablets.  0 refills.

## 2023-06-28 NOTE — Telephone Encounter (Signed)
 Sent message to staff regarding status of referral.

## 2023-06-29 NOTE — Progress Notes (Unsigned)
 Office Visit Note  Patient: Erica Fox             Date of Birth: 05/21/1985           MRN: [id number]             PCP: Abram Abraham, NP-C Referring: Abram Abraham, NP-C Visit Date: 07/13/2023 Occupation: @GUAROCC @  Subjective:  Left knee swelling   History of Present Illness: Dawnita Molner is a 38 y.o. female with history of seronegative rheumatoid arthritis and osteoarthritis.  Patient remains on  Rinvoq  15 mg 1 tablet by mouth daily, Rasuvo  25 mg sq injections once weekly, and folic acid  2 mg daily.  Patient initiated Rinvoq  on 01/08/2023.  She is tolerating combination therapy without side effects.  She presents today with pain and swelling in the left knee.  She requested to have a left knee aspiration today.  She is scheduled for a left knee joint replacement on 07/28/2023 with Dr. Rozelle Corning.  Patient states that besides the pain and stiffness involving both knees her other joints are doing well on the current treatment regimen. She denies any recent or recurrent infections.     Activities of Daily Living:  Patient reports morning stiffness for all day. Patient Reports nocturnal pain.  Difficulty dressing/grooming: Reports Difficulty climbing stairs: Reports Difficulty getting out of chair: Reports Difficulty using hands for taps, buttons, cutlery, and/or writing: Reports  Review of Systems  Constitutional:  Positive for fatigue.  HENT:  Negative for mouth sores, mouth dryness and nose dryness.   Eyes:  Negative for pain and dryness.  Respiratory:  Negative for shortness of breath and difficulty breathing.   Cardiovascular:  Negative for chest pain and palpitations.  Gastrointestinal:  Negative for blood in stool, constipation and diarrhea.  Endocrine: Negative for increased urination.  Genitourinary:  Negative for involuntary urination.  Musculoskeletal:  Positive for joint pain, gait problem, joint pain, joint swelling, myalgias, muscle weakness, morning stiffness, muscle  tenderness and myalgias.  Skin:  Negative for color change, rash, hair loss and sensitivity to sunlight.  Allergic/Immunologic: Negative for susceptible to infections.  Neurological:  Positive for headaches. Negative for dizziness.  Hematological:  Negative for swollen glands.  Psychiatric/Behavioral:  Positive for sleep disturbance. Negative for depressed mood. The patient is nervous/anxious.     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   left eye, dx by opthalmology   Degenerative joint disease    OCD (obsessive compulsive disorder) 2025   per patient   PTSD (post-traumatic stress disorder) 2025   per patient   Rheumatoid arthritis (HCC)     Family History  Problem Relation Age of Onset   Heart disease Mother    Breast cancer Paternal Aunt    Healthy Son    Healthy Son    Past Surgical History:  Procedure Laterality Date   CESAREAN SECTION     2007, 2012   EYE SURGERY Right    age 28, tighten muscle for lazy eye   FOOT SURGERY Left    hammertoe correction & screws placed   FOOT SURGERY Right 05/07/2022   hammertoe correction   TUBAL LIGATION  WISDOM TOOTH EXTRACTION  2014   Social History   Social History Narrative   Not on file    There is no immunization history on file for this patient.   Objective: Vital Signs: BP 128/83 (BP Location: Left Arm, Patient Position: Sitting, Cuff Size: Large)   Pulse 87   Resp 16   Ht 5\' 8"  (1.727 m)   Wt 281 lb (127.5 kg)   BMI 42.73 kg/m    Physical Exam Vitals and nursing note reviewed.  Constitutional:      Appearance: She is well-developed.  HENT:     Head: Normocephalic and  atraumatic.  Eyes:     Conjunctiva/sclera: Conjunctivae normal.  Cardiovascular:     Rate and Rhythm: Normal rate and regular rhythm.     Heart sounds: Normal heart sounds.  Pulmonary:     Effort: Pulmonary effort is normal.     Breath sounds: Normal breath sounds.  Abdominal:     General: Bowel sounds are normal.     Palpations: Abdomen is soft.  Musculoskeletal:     Cervical back: Normal range of motion.  Lymphadenopathy:     Cervical: No cervical adenopathy.  Skin:    General: Skin is warm and dry.     Capillary Refill: Capillary refill takes less than 2 seconds.  Neurological:     Mental Status: She is alert and oriented to person, place, and time.  Psychiatric:        Behavior: Behavior normal.      Musculoskeletal Exam: C-spine has slightly limited ROM with lateral rotation.  Shoulder joints have good ROM with no discomfort. Elbow joints have good ROM. Severely limited ROM of both wrists.  Synovial thickening of MCP joints and PIP joints but no synovitis noted.  Painful range of motion of both knees.  Synovial thickening, warmth, limited extension of the left knee noted on examination today.  Ankle joints have limited range of motion.  CDAI Exam: CDAI Score: -- Patient Global: --; Provider Global: -- Swollen: --; Tender: -- Joint Exam 07/13/2023   No joint exam has been documented for this visit   There is currently no information documented on the homunculus. Go to the Rheumatology activity and complete the homunculus joint exam.  Investigation: No additional findings.  Imaging: XR Knee 1-2 Views Left Result Date: 06/18/2023 AP lateral merchant radiographs left knee reviewed.  No acute fracture.  Medial and lateral joint spaces reasonably well-maintained.  Moderate patellofemoral arthritis.  Alignment intact.  XR KNEE 3 VIEW RIGHT Result Date: 06/18/2023 AP lateral merchant radiographs right knee reviewed.  No acute fracture.  Alignment intact.  Moderate  patellofemoral arthritis is present.   Recent Labs: Lab Results  Component Value Date   WBC 8.8 06/01/2023   HGB 12.6 06/01/2023   PLT 264 06/01/2023   NA 137 06/01/2023   K 4.1 06/01/2023   CL 105 06/01/2023   CO2 28 06/01/2023   GLUCOSE 89 06/01/2023   BUN 9 06/01/2023   CREATININE 0.68 06/01/2023   BILITOT 0.5 06/01/2023   ALKPHOS 66 03/11/2022   AST 23 06/01/2023   ALT 20 06/01/2023   PROT 7.6 06/01/2023   ALBUMIN 4.8 03/11/2022   CALCIUM 9.4 06/01/2023   QFTBGOLDPLUS NEGATIVE 07/22/2022    Speciality Comments: Enbrel-discontinued Humira-inadequate response Simponi  started October 05, 2022 Methotrexate  started August 26, 2022  Procedures:  No procedures performed Allergies: Penicillins   Assessment / Plan:     Visit Diagnoses: Rheumatoid arthritis of multiple sites with negative  rheumatoid factor (HCC) - RF negative, anti-CCP negative, MCV negative, elevated sedimentation rate. JIA at age 68.  Severe erosive rheumatoid arthritis: Patient remains on Rinvoq  15 mg 1 tablet by mouth daily, Rasuvo  25 mg subcutaneous injections once weekly, and folic acid  2 mg daily.  She is tolerating combination therapy without any side effects and has not had any recent gaps in therapy.  Overall her rheumatoid arthritis has been significantly better controlled while taking Rinvoq .  Sed rate and CRP were within normal limits on 06/01/2023.   Patient continues to have chronic pain, stiffness, and intermittent inflammation involving both knees, left greater than right.  She is scheduled for a left total knee replacement with Dr. Rozelle Corning on 07/28/2023 but presents today with increased pain and swelling in the left knee.  She was hoping to have a left knee joint aspiration but no fluid was able to be aspirated today.  Avoided administering a cortisone injection due to upcoming total knee replacement.  Discussed the importance of rest, compression, and elevation.   She plans on holding Rinvoq  4 days prior to  surgery and will require clearance by Dr. Rozelle Corning prior to reinitiating Rinvoq  and methotrexate  after surgery.  She will follow-up in the office in 3 to 4 months or sooner if needed.  High risk medication use - Rinvoq  15 mg 1 tablet by mouth daily, Rasuvo  25 mg sq injections once weekly, and folic acid  2 mg daily.Patient initiated Rinvoq  on 01/08/2023.  CBC and CMP updated on 06/01/23. Her next lab work will be due in June and every 3 months.   TB gold negative on 07/22/22.  Future order for TB gold placed today.  Lipid panel WNL 03/03/23.  No recent or recurrent infections.  Discussed the importance of holding rinvoq  and rasuvo  if she develops signs or symptoms of an infection and to resume once the infection has completely cleared.  - Plan: CBC with Differential/Platelet, Comprehensive metabolic panel with GFR, QuantiFERON-TB Gold Plus  Screening for tuberculosis -Future order for TB gold placed today.  Plan: QuantiFERON-TB Gold Plus  Pain in both hands: Severely limited range of motion of both wrist joints with synovial thickening.  Synovial thickening of MCP and PIP joints but no synovitis was noted.  Effusion, left knee: Recurrent-warmth and inflammation of the left knee was noted on examination today.  Small effusion noted.  An aspiration was attempted but no fluid was aspirated.  3 mL of lidocaine  was administered but no cortisone was administered due to upcoming left knee replacement on 07/28/2023.   Primary osteoarthritis of both knees: Patient continues to have persistent pain, stiffness, and intermittent inflammation involving both knees, left greater than right.  Patient had MRIs of bilateral knees on 06/22/2023.  She is scheduled for left total knee replacement by Dr. Rozelle Corning on 07/28/2023.  Pain in both feet - Left achilles tendonitis-under the care of Dr. Robinson Chough office visit note from 05/17/2023 -treated with a course of Medrol  6-day taper.  Limited range of motion of both ankle joints  but no obvious synovitis was noted on exam.  JIA (juvenile idiopathic arthritis) (HCC) - Diagnosed at age 54.  Treated with different DMARDs and Biologics until age 59.  She stopped methotrexate  and Enbrel at age 16  Elevated sed rate: ESR and CRP within normal limits on 06/01/2023.  Other medical conditions are listed as follows:  IgA deficiency (HCC)  Seasonal allergies  Other fatigue  Vitamin D  deficiency  PTSD (post-traumatic stress disorder)  History of ADHD  History of depression    Orders: Orders Placed This Encounter  Procedures   CBC with Differential/Platelet   Comprehensive metabolic panel with GFR   QuantiFERON-TB Gold Plus   No orders of the defined types were placed in this encounter.    Follow-Up Instructions: Return in about 3 months (around 10/12/2023) for Rheumatoid arthritis, Osteoarthritis.   Romayne Clubs, PA-C  Note - This record has been created using Dragon software.  Chart creation errors have been sought, but may not always  have been located. Such creation errors do not reflect on  the standard of medical care.

## 2023-06-29 NOTE — Telephone Encounter (Signed)
 I called pain management, office will check on review and advise.

## 2023-07-01 ENCOUNTER — Encounter: Payer: Self-pay | Admitting: Physical Medicine & Rehabilitation

## 2023-07-05 ENCOUNTER — Encounter: Payer: Self-pay | Admitting: Family Medicine

## 2023-07-05 ENCOUNTER — Telehealth: Payer: Self-pay

## 2023-07-05 ENCOUNTER — Ambulatory Visit (INDEPENDENT_AMBULATORY_CARE_PROVIDER_SITE_OTHER): Admitting: Family Medicine

## 2023-07-05 ENCOUNTER — Other Ambulatory Visit: Payer: Self-pay | Admitting: Rheumatology

## 2023-07-05 ENCOUNTER — Other Ambulatory Visit (HOSPITAL_COMMUNITY): Payer: Self-pay

## 2023-07-05 DIAGNOSIS — Z79899 Other long term (current) drug therapy: Secondary | ICD-10-CM

## 2023-07-05 DIAGNOSIS — M25561 Pain in right knee: Secondary | ICD-10-CM | POA: Diagnosis not present

## 2023-07-05 DIAGNOSIS — F419 Anxiety disorder, unspecified: Secondary | ICD-10-CM | POA: Diagnosis not present

## 2023-07-05 DIAGNOSIS — M0609 Rheumatoid arthritis without rheumatoid factor, multiple sites: Secondary | ICD-10-CM | POA: Diagnosis not present

## 2023-07-05 DIAGNOSIS — M25562 Pain in left knee: Secondary | ICD-10-CM

## 2023-07-05 DIAGNOSIS — F32A Depression, unspecified: Secondary | ICD-10-CM

## 2023-07-05 DIAGNOSIS — G8929 Other chronic pain: Secondary | ICD-10-CM

## 2023-07-05 MED ORDER — DULOXETINE HCL 60 MG PO CPEP: 60.0000 mg | ORAL_CAPSULE | Freq: Every day | ORAL | 1 refills | Status: AC

## 2023-07-05 MED ORDER — ZEPBOUND 2.5 MG/0.5ML ~~LOC~~ SOAJ: 2.5000 mg | SUBCUTANEOUS | 1 refills | Status: DC

## 2023-07-05 NOTE — Patient Instructions (Signed)
 I am increasing your Cymbalta dose.   Hopefully, Zepbound will be covered and you can start these weekly injections.

## 2023-07-05 NOTE — Telephone Encounter (Signed)
 Pharmacy Patient Advocate Encounter  Received notification from [facility] that Prior Authorization for Zepbound 2.5MG /0.5ML pen-injectors has been APPROVED from 07/05/2023 to 01/04/2024   PA #/Case ID/Reference #: [id number]

## 2023-07-05 NOTE — Progress Notes (Signed)
 Subjective:     Patient ID: Erica Fox, female    DOB: Feb 09, 1986, 38 y.o.   MRN: [id number]  Chief Complaint  Patient presents with   Medication Management    increase cymbalta to 60mg  as recommended by her rheumotologist    HPI  History of Present Illness          She is here to discuss pain. Having bilateral knee pain. Under the care of Dr. August Saucer.   Appt with pain management scheduled May 28th.   Currently taking prednisone 5 mg daily per rheumatologist.   She is interested in increasing Cymbalta dose to 60 mg. She has been taking 30 mg x 4 wks.   She has tried dieting for weight loss in the past unsuccessfully. Unable to exercise due to chronic pain. Interested in weight loss medication.   Denies hx of pancreatitis.  No personal or family hx of thyroid cancer.  No chance of pregnancy.     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   left eye, dx by opthalmology   Degenerative joint disease    OCD (obsessive compulsive disorder) 2025   per patient   PTSD (post-traumatic stress disorder) 2025   per patient   Rheumatoid arthritis [facility])     Past Surgical History:  Procedure Laterality Date   CESAREAN SECTION     2007, 2012   EYE SURGERY Right    age 56, tighten muscle for lazy eye   FOOT SURGERY Left    hammertoe correction & screws placed   FOOT SURGERY Right 05/07/2022   hammertoe correction   TUBAL LIGATION     WISDOM TOOTH EXTRACTION  2014    Family History  Problem Relation Age of Onset   Heart disease Mother    Breast cancer Paternal Aunt    Healthy Son    Healthy Son     Social History   Socioeconomic History    Marital status: Married    Spouse name: Not on file   Number of children: Not on file   Years of education: Not on file   Highest education level: Associate degree: academic program  Occupational History   Not on file  Tobacco Use   Smoking status: Never    Passive exposure: Past   Smokeless tobacco: Never  Vaping Use   Vaping status: Never Used  Substance and Sexual Activity   Alcohol use: Not Currently    Comment: very rarely   Drug use: Not Currently   Sexual activity: Yes    Partners: Male    Birth control/protection: I.U.D., Surgical    Comment: tubal, Mirena, menarche 38yo, sexual debut 38yo  Other Topics Concern   Not on file  Social History Narrative   Not on file   Social Drivers of Health   Financial Resource Strain: Low Risk  (07/03/2023)   Overall Financial Resource Strain (CARDIA)    Difficulty of Paying Living Expenses: Not very hard  Food Insecurity: No Food Insecurity (07/03/2023)   Hunger Vital Sign    Worried About Running Out of Food in the Last Year: Never  true    Ran Out of Food in the Last Year: Never true  Transportation Needs: No Transportation Needs (07/03/2023)   PRAPARE - Administrator, Civil Service (Medical): No    Lack of Transportation (Non-Medical): No  Physical Activity: Unknown (07/03/2023)   Exercise Vital Sign    Days of Exercise per Week: 0 days    Minutes of Exercise per Session: Not on file  Stress: Stress Concern Present (07/03/2023)   [employer] of Occupational Health - Occupational Stress Questionnaire    Feeling of Stress : To some extent  Social Connections: Socially Isolated (07/03/2023)   Social Connection and Isolation Panel [NHANES]    Frequency of Communication with Friends and Family: Never    Frequency of Social Gatherings with Friends and Family: Never    Attends Religious Services: Never    Database administrator or Organizations: No    Attends Engineer, structural: Not on file     Marital Status: Married  Catering manager Violence: Not on file    Outpatient Medications Prior to Visit  Medication Sig Dispense Refill   B Complex Vitamins (VITAMIN B COMPLEX PO) Take by mouth daily.     Cholecalciferol (VITAMIN D-3 PO) Take 1,000 Units by mouth daily.     folic acid (FOLVITE) 1 MG tablet Take 2 tablets (2 mg total) by mouth daily. 180 tablet 3   levonorgestrel (MIRENA) 20 MCG/DAY IUD 1 each by Intrauterine route once. Inserted 02/2022 per patient     predniSONE (DELTASONE) 5 MG tablet Take 1 tablet (5 mg total) by mouth daily with breakfast. 30 tablet 0   RASUVO 25 MG/0.5ML SOAJ INJECT THE CONTENTS OF ONE  AUTO-INJECTOR SUBCUTANEOUSLY  WEEKLY AS DIRECTED 6 mL 0   RINVOQ 15 MG TB24 TAKE 1 TABLET BY MOUTH DAILY 30 tablet 2   DULoxetine (CYMBALTA) 30 MG capsule Take 1 capsule (30 mg total) by mouth daily. 30 capsule 1   predniSONE (DELTASONE) 5 MG tablet Take 2 tabs po qd x 4 days, 1.5  tabs po qd x 4 days, 1 tab po qd x 4 days, 1/2  tab po qd x 4 days 20 tablet 0   No facility-administered medications prior to visit.    Allergies  Allergen Reactions   Penicillins Other (See Comments)    Adopted mother informed her of allergy but unaware of reaction.    Review of Systems  Constitutional:  Negative for chills, fever and weight loss.  Respiratory:  Negative for shortness of breath.   Cardiovascular:  Negative for chest pain, palpitations and leg swelling.  Gastrointestinal:  Negative for abdominal pain, constipation, diarrhea, nausea and vomiting.  Genitourinary:  Negative for dysuria, frequency and urgency.  Musculoskeletal:  Positive for joint pain and myalgias.  Neurological:  Negative for dizziness and focal weakness.  Psychiatric/Behavioral:  Positive for depression. The patient is nervous/anxious.        Objective:    Physical Exam Constitutional:      General: She is not in acute distress.    Appearance: She is not ill-appearing.  Eyes:      Extraocular Movements: Extraocular movements intact.     Conjunctiva/sclera: Conjunctivae normal.  Cardiovascular:     Rate and Rhythm: Normal rate.  Pulmonary:     Effort: Pulmonary effort is normal.  Musculoskeletal:     Cervical back: Normal range of motion and neck supple.  Skin:    General: Skin is warm and dry.  Neurological:  General: No focal deficit present.     Mental Status: She is alert and oriented to person, place, and time.     Motor: No weakness.     Coordination: Coordination normal.     Gait: Gait normal.  Psychiatric:        Mood and Affect: Mood normal.        Behavior: Behavior normal.        Thought Content: Thought content normal.      BP 132/80 (BP Location: Left Arm, Patient Position: Sitting)   Pulse 70   Temp 98 F (36.7 C) (Temporal)   Ht 5\' 8"  (1.727 m)   Wt 279 lb (126.6 kg)   SpO2 99%   BMI 42.42 kg/m  Wt Readings from Last 3 Encounters:  07/05/23 279 lb (126.6 kg)  06/01/23 278 lb 12.8 oz (126.5 kg)  03/30/23 272 lb (123.4 kg)       Assessment & Plan:   Problem List Items Addressed This Visit     Rheumatoid arthritis of multiple sites with negative rheumatoid factor (HCC)   Other Visit Diagnoses       Severe obesity (BMI >= 40) (HCC)    -  Primary   Relevant Medications   tirzepatide (ZEPBOUND) 2.5 MG/0.5ML Pen     Chronic pain of both knees       Relevant Medications   DULoxetine (CYMBALTA) 60 MG capsule     Anxiety and depression       Relevant Medications   DULoxetine (CYMBALTA) 60 MG capsule      Reviewed notes from specialists.  Increase Cymbalta to 60 mg daily.  Recommend Zepbound to help with weight loss and ultimately her overall health including knee pain. Counseling on how to take the medication, side effects and how to be successful on the medication.  Follow up here in 6- 7 weeks.   I have discontinued Tamu Farone's DULoxetine. I am also having her start on Zepbound and DULoxetine. Additionally, I am  having her maintain her folic acid, levonorgestrel, Rasuvo, Cholecalciferol (VITAMIN D-3 PO), B Complex Vitamins (VITAMIN B COMPLEX PO), Rinvoq, and predniSONE.  Meds ordered this encounter  Medications   tirzepatide (ZEPBOUND) 2.5 MG/0.5ML Pen    Sig: Inject 2.5 mg into the skin once a week.    Dispense:  2 mL    Refill:  1    Supervising Provider:   Bambi Lever A [[id number]]   DULoxetine (CYMBALTA) 60 MG capsule    Sig: Take 1 capsule (60 mg total) by mouth daily.    Dispense:  30 capsule    Refill:  1    Supervising Provider:   Bambi Lever A [[id number]]

## 2023-07-05 NOTE — Telephone Encounter (Signed)
 Pharmacy Patient Advocate Encounter   Received notification from CoverMyMeds that prior authorization for Zepbound 2.5MG /0.5ML pen-injectors is required/requested.   Insurance verification completed.   The patient is insured through [facility] .   Per test claim: PA required; PA submitted to above mentioned insurance via CoverMyMeds Key/confirmation #/EOC [id number] Status is pending

## 2023-07-06 ENCOUNTER — Ambulatory Visit (INDEPENDENT_AMBULATORY_CARE_PROVIDER_SITE_OTHER): Admitting: Orthopedic Surgery

## 2023-07-06 ENCOUNTER — Ambulatory Visit: Admitting: Orthopedic Surgery

## 2023-07-06 DIAGNOSIS — M1712 Unilateral primary osteoarthritis, left knee: Secondary | ICD-10-CM | POA: Diagnosis not present

## 2023-07-07 ENCOUNTER — Other Ambulatory Visit: Payer: Self-pay | Admitting: Radiology

## 2023-07-07 ENCOUNTER — Encounter: Payer: Self-pay | Admitting: Family Medicine

## 2023-07-07 DIAGNOSIS — R1032 Left lower quadrant pain: Secondary | ICD-10-CM

## 2023-07-07 NOTE — Telephone Encounter (Signed)
 Recommend holding rinvoq at least 4 days prior to surgery.  She will require clearance by Dr. Rozelle Corning prior to resuming therapy during the postoperative period.

## 2023-07-08 ENCOUNTER — Encounter: Payer: Self-pay | Admitting: Orthopedic Surgery

## 2023-07-08 NOTE — Progress Notes (Signed)
 Office Visit Note   Patient: Erica Fox           Date of Birth: 12/04/1985           MRN: [id number] Visit Date: 07/06/2023 Requested by: Abram Abraham, NP-C [id number] [place],  [place] [address] PCP: Abram Abraham, NP-C  Subjective: Chief Complaint  Patient presents with   Other    Scan review    HPI: Erica Fox is a 38 y.o. female who presents to the office reporting continued left knee pain and right knee pain.  Did have an injection with rheumatology which did not help and the gel injections also have not helped.  MRI scans are reviewed.  She does have some degenerative meniscal pathology with the right looking worse than the left.  Degenerative arthritis is also present more in the medial and patellofemoral compartments as opposed to the lateral compartment.  She states that she "cannot deal with the pain much longer".  She is on Biologics for autoimmune disease.  States that both knees give way but the left knee is more painful.  She works in Therapist, sports.  Cannot really stand up to be  in the kitchen or to do any type of normal work in the house.  Hiking and outdoor activities are also prohibitively painful for her.  No personal or family history of DVT or pulmonary embolism.  No prior surgeries on either knee.              ROS: All systems reviewed are negative as they relate to the chief complaint within the history of present illness.  Patient denies fevers or chills.  Assessment & Plan: Visit Diagnoses:  1. Arthritis of left knee     Plan: Impression is bilateral knee arthritis primarily in the medial compartment.  Left knee is more symptomatic than the right knee.  Not really having any mechanical symptoms but only pain which she localizes mostly to that medial side.  At this time Janie is really looking for a complete solution to this problem.  I do not think arthroscopic intervention would be predictably helpful for her.  Nothing really to "debride or  take out" at this time.  Although she is young knee replacement would likely be the most predictable surgical outcome for her in terms of pain relief.  The difficulty of the rehab is also discussed.  She would be at slightly higher risk because she is on Biologics for her autoimmune disease.  In general the risk and benefits are discussed with Diarra including but not limited to infection or vessel damage incomplete pain relief as well as incomplete restoration of function and the possibility of revision in her lifetime.  If bone quality allows we would use press-fit prosthesis.  I think having the left knee functional and intact could also unload the right knee to some degree.  All questions answered. Follow-Up Instructions: No follow-ups on file.   Orders:  No orders of the defined types were placed in this encounter.  No orders of the defined types were placed in this encounter.     Procedures: No procedures performed   Clinical Data: No additional findings.  Objective: Vital Signs: There were no vitals taken for this visit.  Physical Exam:  Constitutional: Patient appears well-developed HEENT:  Head: Normocephalic Eyes:EOM are normal Neck: Normal range of motion Cardiovascular: Normal rate Pulmonary/chest: Effort normal Neurologic: Patient is alert Skin: Skin is warm Psychiatric: Patient has normal mood and  affect  Ortho Exam: Ortho exam demonstrates slightly antalgic gait to the left.  Pedal pulses palpable.  Ankle dorsiflexion intact.  Patient has good range of motion from full extension to about 115-120 flexion.  Does have medial greater than lateral joint line tenderness with mild patellofemoral crepitus bilaterally.  No effusion in either knee.  No masses lymphadenopathy or skin changes noted in the left or right knee region.  Specialty Comments:  No specialty comments available.  Imaging: No results found.   PMFS History: Patient Active Problem List   Diagnosis  Date Noted   Rosacea 02/02/2023   Seasonal allergies 08/26/2022   Rheumatoid arthritis of multiple sites with negative rheumatoid factor (HCC) 07/22/2022   Past Medical History:  Diagnosis Date   ADHD (attention deficit hyperactivity disorder) 2025   per patient   Anemia    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   left eye, dx by opthalmology   Degenerative joint disease    OCD (obsessive compulsive disorder) 2025   per patient   PTSD (post-traumatic stress disorder) 2025   per patient   Rheumatoid arthritis (HCC)     Family History  Problem Relation Age of Onset   Heart disease Mother    Breast cancer Paternal Aunt    Healthy Son    Healthy Son     Past Surgical History:  Procedure Laterality Date   CESAREAN SECTION     2007, 2012   EYE SURGERY Right    age 52, tighten muscle for lazy eye   FOOT SURGERY Left    hammertoe correction & screws placed   FOOT SURGERY Right 05/07/2022   hammertoe correction   TUBAL LIGATION     WISDOM TOOTH EXTRACTION  2014   Social History   Occupational History   Not on file  Tobacco Use   Smoking status: Never    Passive exposure: Past   Smokeless tobacco: Never  Vaping Use   Vaping status: Never Used  Substance and Sexual Activity   Alcohol use: Not Currently    Comment: very rarely   Drug use: Not Currently   Sexual activity: Yes    Partners: Male    Birth control/protection: I.U.D., Surgical    Comment: tubal, Mirena , menarche 38yo, sexual debut 38yo

## 2023-07-11 ENCOUNTER — Encounter: Payer: Self-pay | Admitting: Orthopedic Surgery

## 2023-07-12 ENCOUNTER — Encounter: Payer: Self-pay | Admitting: Orthopedic Surgery

## 2023-07-12 ENCOUNTER — Encounter: Payer: Self-pay | Admitting: *Deleted

## 2023-07-12 NOTE — Telephone Encounter (Signed)
 Patient should reach out to Dr. Rozelle Corning to see if an aspiration would be beneficial.   The effusion may return without administration of cortisone but cortisone is not typically recommended this close to surgical intervention but she should check with Dr. Rozelle Corning.     She has an office visit with me scheduled tomorrow--would he like to postpone this visit until after her surgery if she cannot have an aspiration performed at this time?

## 2023-07-12 NOTE — Telephone Encounter (Signed)
 Aspiration can be attempted tomorrow.

## 2023-07-13 ENCOUNTER — Ambulatory Visit: Attending: Physician Assistant | Admitting: Physician Assistant

## 2023-07-13 ENCOUNTER — Other Ambulatory Visit: Admitting: Radiology

## 2023-07-13 ENCOUNTER — Encounter: Payer: Self-pay | Admitting: Physician Assistant

## 2023-07-13 ENCOUNTER — Other Ambulatory Visit

## 2023-07-13 VITALS — BP 128/83 | HR 87 | Resp 16 | Ht 68.0 in | Wt 281.0 lb

## 2023-07-13 DIAGNOSIS — M0880 Other juvenile arthritis, unspecified site: Secondary | ICD-10-CM

## 2023-07-13 DIAGNOSIS — R5383 Other fatigue: Secondary | ICD-10-CM

## 2023-07-13 DIAGNOSIS — M0609 Rheumatoid arthritis without rheumatoid factor, multiple sites: Secondary | ICD-10-CM | POA: Diagnosis not present

## 2023-07-13 DIAGNOSIS — R700 Elevated erythrocyte sedimentation rate: Secondary | ICD-10-CM

## 2023-07-13 DIAGNOSIS — Z79899 Other long term (current) drug therapy: Secondary | ICD-10-CM

## 2023-07-13 DIAGNOSIS — M79672 Pain in left foot: Secondary | ICD-10-CM

## 2023-07-13 DIAGNOSIS — M25462 Effusion, left knee: Secondary | ICD-10-CM | POA: Diagnosis not present

## 2023-07-13 DIAGNOSIS — D802 Selective deficiency of immunoglobulin A [IgA]: Secondary | ICD-10-CM

## 2023-07-13 DIAGNOSIS — M79671 Pain in right foot: Secondary | ICD-10-CM

## 2023-07-13 DIAGNOSIS — M79641 Pain in right hand: Secondary | ICD-10-CM | POA: Diagnosis not present

## 2023-07-13 DIAGNOSIS — M79642 Pain in left hand: Secondary | ICD-10-CM

## 2023-07-13 DIAGNOSIS — Z8659 Personal history of other mental and behavioral disorders: Secondary | ICD-10-CM

## 2023-07-13 DIAGNOSIS — F4310 Post-traumatic stress disorder, unspecified: Secondary | ICD-10-CM

## 2023-07-13 DIAGNOSIS — M170 Bilateral primary osteoarthritis of knee: Secondary | ICD-10-CM

## 2023-07-13 DIAGNOSIS — Z111 Encounter for screening for respiratory tuberculosis: Secondary | ICD-10-CM

## 2023-07-13 DIAGNOSIS — J302 Other seasonal allergic rhinitis: Secondary | ICD-10-CM

## 2023-07-13 DIAGNOSIS — E559 Vitamin D deficiency, unspecified: Secondary | ICD-10-CM

## 2023-07-13 NOTE — Patient Instructions (Signed)

## 2023-07-14 NOTE — Telephone Encounter (Signed)
 I don't send in orders until a week out so I have not sent it yet. I will let her know she will hear from [facility] in the next week or so.

## 2023-07-18 NOTE — Progress Notes (Signed)
 Surgical Instructions   Your procedure is scheduled on Thursday Jul 28, 2023. Report to [facility] Main Entrance "A" at 7:10 A.M., then check in with the Admitting office. Any questions or running late day of surgery: call [phone]  Questions prior to your surgery date: call [phone], Monday-Friday, 8am-4pm. If you experience any cold or flu symptoms such as cough, fever, chills, shortness of breath, etc. between now and your scheduled surgery, please notify us  at the above number.     Remember:  Do not eat after midnight the night before your surgery  You may drink clear liquids until 6:10 the morning of your surgery.   Clear liquids allowed are: Water, Non-Citrus Juices (without pulp), Carbonated Beverages, Clear Tea (no milk, honey, etc.), Black Coffee Only (NO MILK, CREAM OR POWDERED CREAMER of any kind), and Gatorade.  Patient Instructions  The night before surgery:  No food after midnight. ONLY clear liquids after midnight  The day of surgery (if you do NOT have diabetes):  Drink ONE (1) Pre-Surgery Clear Ensure by 6:10 the morning of surgery. Drink in one sitting. Do not sip.  This drink was given to you during your hospital  pre-op appointment visit.  Nothing else to drink after completing the  Pre-Surgery Clear Ensure.         If you have questions, please contact your surgeon's office.   Take these medicines the morning of surgery with A SIP OF WATER  DULoxetine  (CYMBALTA )  predniSONE  (DELTASONE )  RINVOQ    PLEASE CONSULT WITH YOUR PRESCRIBER IF YOU NEED TO HOLD YOUR RASUVO  PRIOR TO SURGERY.    One week prior to surgery, STOP taking any Aspirin (unless otherwise instructed by your surgeon) Aleve, Naproxen, Ibuprofen , Motrin , Advil , Goody's, BC's, all herbal medications, fish oil, and non-prescription vitamins.                     Do NOT Smoke (Tobacco/Vaping) for 24 hours prior to your procedure.  If you use a CPAP at night, you may bring your mask/headgear  for your overnight stay.   You will be asked to remove any contacts, glasses, piercing's, hearing aid's, dentures/partials prior to surgery. Please bring cases for these items if needed.    Patients discharged the day of surgery will not be allowed to drive home, and someone needs to stay with them for 24 hours.  SURGICAL WAITING ROOM VISITATION Patients may have no more than 2 support people in the waiting area - these visitors may rotate.   Pre-op nurse will coordinate an appropriate time for 1 ADULT support person, who may not rotate, to accompany patient in pre-op.  Children under the age of 15 must have an adult with them who is not the patient and must remain in the main waiting area with an adult.  If the patient needs to stay at the hospital during part of their recovery, the visitor guidelines for inpatient rooms apply.  Please refer to the [facility] website for the visitor guidelines for any additional information.   If you received a COVID test during your pre-op visit  it is requested that you wear a mask when out in public, stay away from anyone that may not be feeling well and notify your surgeon if you develop symptoms. If you have been in contact with anyone that has tested positive in the last 10 days please notify you surgeon.      Pre-operative 5 CHG Bathing Instructions   You can play  a key role in reducing the risk of infection after surgery. Your skin needs to be as free of germs as possible. You can reduce the number of germs on your skin by washing with CHG (chlorhexidine gluconate) soap before surgery. CHG is an antiseptic soap that kills germs and continues to kill germs even after washing.   DO NOT use if you have an allergy  to chlorhexidine/CHG or antibacterial soaps. If your skin becomes reddened or irritated, stop using the CHG and notify one of our RNs at [phone].   Please shower with the CHG soap starting 4 days before surgery using the following  schedule:     Please keep in mind the following:  DO NOT shave, including legs and underarms, starting the day of your first shower.   You may shave your face at any point before/day of surgery.  Place clean sheets on your bed the day you start using CHG soap. Use a clean washcloth (not used since being washed) for each shower. DO NOT sleep with pets once you start using the CHG.   CHG Shower Instructions:  Wash your face and private area with normal soap. If you choose to wash your hair, wash first with your normal shampoo.  After you use shampoo/soap, rinse your hair and body thoroughly to remove shampoo/soap residue.  Turn the water OFF and apply about 3 tablespoons (45 ml) of CHG soap to a CLEAN washcloth.  Apply CHG soap ONLY FROM YOUR NECK DOWN TO YOUR TOES (washing for 3-5 minutes)  DO NOT use CHG soap on face, private areas, open wounds, or sores.  Pay special attention to the area where your surgery is being performed.  If you are having back surgery, having someone wash your back for you may be helpful. Wait 2 minutes after CHG soap is applied, then you may rinse off the CHG soap.  Pat dry with a clean towel  Put on clean clothes/pajamas   If you choose to wear lotion, please use ONLY the CHG-compatible lotions that are listed below.  Additional instructions for the day of surgery: DO NOT APPLY any lotions, deodorants, cologne, or perfumes.   Do not bring valuables to the hospital. [facility] is not responsible for any belongings/valuables. Do not wear nail polish, gel polish, artificial nails, or any other type of covering on natural nails (fingers and toes) Do not wear jewelry or makeup Put on clean/comfortable clothes.  Please brush your teeth.  Ask your nurse before applying any prescription medications to the skin.     CHG Compatible Lotions   Aveeno Moisturizing lotion  Cetaphil Moisturizing Cream  Cetaphil Moisturizing Lotion  Clairol Herbal Essence  Moisturizing Lotion, Dry Skin  Clairol Herbal Essence Moisturizing Lotion, Extra Dry Skin  Clairol Herbal Essence Moisturizing Lotion, Normal Skin  Curel Age Defying Therapeutic Moisturizing Lotion with Alpha Hydroxy  Curel Extreme Care Body Lotion  Curel Soothing Hands Moisturizing Hand Lotion  Curel Therapeutic Moisturizing Cream, Fragrance-Free  Curel Therapeutic Moisturizing Lotion, Fragrance-Free  Curel Therapeutic Moisturizing Lotion, Original Formula  Eucerin Daily Replenishing Lotion  Eucerin Dry Skin Therapy Plus Alpha Hydroxy Crme  Eucerin Dry Skin Therapy Plus Alpha Hydroxy Lotion  Eucerin Original Crme  Eucerin Original Lotion  Eucerin Plus Crme Eucerin Plus Lotion  Eucerin TriLipid Replenishing Lotion  Keri Anti-Bacterial Hand Lotion  Keri Deep Conditioning Original Lotion Dry Skin Formula Softly Scented  Keri Deep Conditioning Original Lotion, Fragrance Free Sensitive Skin Formula  Keri Lotion Fast Absorbing Fragrance Free Sensitive  Skin Formula  Keri Lotion Fast Absorbing Softly Scented Dry Skin Formula  Keri Original Lotion  Keri Skin Renewal Lotion Keri Silky Smooth Lotion  Keri Silky Smooth Sensitive Skin Lotion  Nivea Body Creamy Conditioning Oil  Nivea Body Extra Enriched Lotion  Nivea Body Original Lotion  Nivea Body Sheer Moisturizing Lotion Nivea Crme  Nivea Skin Firming Lotion  NutraDerm 30 Skin Lotion  NutraDerm Skin Lotion  NutraDerm Therapeutic Skin Cream  NutraDerm Therapeutic Skin Lotion  ProShield Protective Hand Cream  Provon moisturizing lotion  Please read over the following fact sheets that you were given.

## 2023-07-19 ENCOUNTER — Other Ambulatory Visit: Payer: Self-pay

## 2023-07-19 ENCOUNTER — Encounter (HOSPITAL_COMMUNITY): Payer: Self-pay

## 2023-07-19 ENCOUNTER — Encounter (HOSPITAL_COMMUNITY)
Admission: RE | Admit: 2023-07-19 | Discharge: 2023-07-19 | Disposition: A | Discharge status: Home / Self Care | Source: Ambulatory Visit | Attending: Orthopedic Surgery | Admitting: Orthopedic Surgery

## 2023-07-19 VITALS — BP 121/61 | HR 65 | Temp 98.3°F | Resp 16 | Ht 68.0 in | Wt 278.5 lb

## 2023-07-19 DIAGNOSIS — Z01812 Encounter for preprocedural laboratory examination: Secondary | ICD-10-CM | POA: Diagnosis not present

## 2023-07-19 DIAGNOSIS — Z01818 Encounter for other preprocedural examination: Secondary | ICD-10-CM | POA: Diagnosis present

## 2023-07-19 LAB — URINALYSIS, W/ REFLEX TO CULTURE (INFECTION SUSPECTED)
Bilirubin Urine: NEGATIVE
Glucose, UA: NEGATIVE mg/dL
Hgb urine dipstick: NEGATIVE
Ketones, ur: NEGATIVE mg/dL
Leukocytes,Ua: NEGATIVE
Nitrite: NEGATIVE
Protein, ur: NEGATIVE mg/dL
Specific Gravity, Urine: 1.021 (ref 1.005–1.030)
pH: 6 (ref 5.0–8.0)

## 2023-07-19 LAB — BASIC METABOLIC PANEL WITH GFR
Anion gap: 8 (ref 5–15)
BUN: 11 mg/dL (ref 6–20)
CO2: 22 mmol/L (ref 22–32)
Calcium: 9 mg/dL (ref 8.9–10.3)
Chloride: 105 mmol/L (ref 98–111)
Creatinine, Ser: 0.64 mg/dL (ref 0.44–1.00)
GFR, Estimated: >60 mL/min (ref >60)
Glucose, Bld: 87 mg/dL (ref 70–99)
Potassium: 4 mmol/L (ref 3.5–5.1)
Sodium: 135 mmol/L (ref 135–145)

## 2023-07-19 LAB — CBC
HCT: 37.8 % (ref 36.0–46.0)
Hemoglobin: 12.3 g/dL (ref 12.0–15.0)
MCH: 31.5 pg (ref 26.0–34.0)
MCHC: 32.5 g/dL (ref 30.0–36.0)
MCV: 96.9 fL (ref 80.0–100.0)
Platelets: 222 10*3/uL (ref 150–400)
RBC: 3.9 MIL/uL (ref 3.87–5.11)
RDW: 13.8 % (ref 11.5–15.5)
WBC: 8.5 10*3/uL (ref 4.0–10.5)
nRBC: 0 % (ref 0.0–0.2)

## 2023-07-19 LAB — SURGICAL PCR SCREEN
MRSA, PCR: NEGATIVE
Staphylococcus aureus: POSITIVE — AB

## 2023-07-19 NOTE — Progress Notes (Signed)
 Surgical Instructions   Your procedure is scheduled on Thursday Jul 28, 2023. Report to [facility] Main Entrance "A" at 7:10 A.M., then check in with the Admitting office. Any questions or running late day of surgery: call [phone]  Questions prior to your surgery date: call [phone], Monday-Friday, 8am-4pm. If you experience any cold or flu symptoms such as cough, fever, chills, shortness of breath, etc. between now and your scheduled surgery, please notify us  at the above number.     Remember:  Do not eat after midnight the night before your surgery  You may drink clear liquids until 6:10 the morning of your surgery.   Clear liquids allowed are: Water, Non-Citrus Juices (without pulp), Carbonated Beverages, Clear Tea (no milk, honey, etc.), Black Coffee Only (NO MILK, CREAM OR POWDERED CREAMER of any kind), and Gatorade.  Patient Instructions  The night before surgery:  No food after midnight. ONLY clear liquids after midnight  The day of surgery (if you do NOT have diabetes):  Drink ONE (1) Pre-Surgery Clear Ensure by 6:10 the morning of surgery. Drink in one sitting. Do not sip.  This drink was given to you during your hospital  pre-op appointment visit.  Nothing else to drink after completing the  Pre-Surgery Clear Ensure.         If you have questions, please contact your surgeon's office.   Take these medicines the morning of surgery with A SIP OF WATER  DULoxetine  (CYMBALTA )  predniSONE  (DELTASONE )   PLEASE CONSULT WITH YOUR PRESCRIBER IF YOU NEED TO HOLD YOUR RASUVO  and RINVOQ  PRIOR TO SURGERY.    One week prior to surgery, STOP taking any Aspirin (unless otherwise instructed by your surgeon) Aleve, Naproxen, Ibuprofen , Motrin , Advil , Goody's, BC's, all herbal medications, fish oil, and non-prescription vitamins.                     Do NOT Smoke (Tobacco/Vaping) for 24 hours prior to your procedure.  If you use a CPAP at night, you may bring your  mask/headgear for your overnight stay.   You will be asked to remove any contacts, glasses, piercing's, hearing aid's, dentures/partials prior to surgery. Please bring cases for these items if needed.    Patients discharged the day of surgery will not be allowed to drive home, and someone needs to stay with them for 24 hours.  SURGICAL WAITING ROOM VISITATION Patients may have no more than 2 support people in the waiting area - these visitors may rotate.   Pre-op nurse will coordinate an appropriate time for 1 ADULT support person, who may not rotate, to accompany patient in pre-op.  Children under the age of 20 must have an adult with them who is not the patient and must remain in the main waiting area with an adult.  If the patient needs to stay at the hospital during part of their recovery, the visitor guidelines for inpatient rooms apply.  Please refer to the [facility] website for the visitor guidelines for any additional information.   If you received a COVID test during your pre-op visit  it is requested that you wear a mask when out in public, stay away from anyone that may not be feeling well and notify your surgeon if you develop symptoms. If you have been in contact with anyone that has tested positive in the last 10 days please notify you surgeon.      Pre-operative 5 CHG Bathing Instructions   You can play  a key role in reducing the risk of infection after surgery. Your skin needs to be as free of germs as possible. You can reduce the number of germs on your skin by washing with CHG (chlorhexidine gluconate) soap before surgery. CHG is an antiseptic soap that kills germs and continues to kill germs even after washing.   DO NOT use if you have an allergy  to chlorhexidine/CHG or antibacterial soaps. If your skin becomes reddened or irritated, stop using the CHG and notify one of our RNs at [phone].   Please shower with the CHG soap starting 4 days before surgery using the  following schedule:     Please keep in mind the following:  DO NOT shave, including legs and underarms, starting the day of your first shower.   You may shave your face at any point before/day of surgery.  Place clean sheets on your bed the day you start using CHG soap. Use a clean washcloth (not used since being washed) for each shower. DO NOT sleep with pets once you start using the CHG.   CHG Shower Instructions:  Wash your face and private area with normal soap. If you choose to wash your hair, wash first with your normal shampoo.  After you use shampoo/soap, rinse your hair and body thoroughly to remove shampoo/soap residue.  Turn the water OFF and apply about 3 tablespoons (45 ml) of CHG soap to a CLEAN washcloth.  Apply CHG soap ONLY FROM YOUR NECK DOWN TO YOUR TOES (washing for 3-5 minutes)  DO NOT use CHG soap on face, private areas, open wounds, or sores.  Pay special attention to the area where your surgery is being performed.  If you are having back surgery, having someone wash your back for you may be helpful. Wait 2 minutes after CHG soap is applied, then you may rinse off the CHG soap.  Pat dry with a clean towel  Put on clean clothes/pajamas   If you choose to wear lotion, please use ONLY the CHG-compatible lotions that are listed below.  Additional instructions for the day of surgery: DO NOT APPLY any lotions, deodorants, cologne, or perfumes.   Do not bring valuables to the hospital. [facility] is not responsible for any belongings/valuables. Do not wear nail polish, gel polish, artificial nails, or any other type of covering on natural nails (fingers and toes) Do not wear jewelry or makeup Put on clean/comfortable clothes.  Please brush your teeth.  Ask your nurse before applying any prescription medications to the skin.     CHG Compatible Lotions   Aveeno Moisturizing lotion  Cetaphil Moisturizing Cream  Cetaphil Moisturizing Lotion  Clairol Herbal  Essence Moisturizing Lotion, Dry Skin  Clairol Herbal Essence Moisturizing Lotion, Extra Dry Skin  Clairol Herbal Essence Moisturizing Lotion, Normal Skin  Curel Age Defying Therapeutic Moisturizing Lotion with Alpha Hydroxy  Curel Extreme Care Body Lotion  Curel Soothing Hands Moisturizing Hand Lotion  Curel Therapeutic Moisturizing Cream, Fragrance-Free  Curel Therapeutic Moisturizing Lotion, Fragrance-Free  Curel Therapeutic Moisturizing Lotion, Original Formula  Eucerin Daily Replenishing Lotion  Eucerin Dry Skin Therapy Plus Alpha Hydroxy Crme  Eucerin Dry Skin Therapy Plus Alpha Hydroxy Lotion  Eucerin Original Crme  Eucerin Original Lotion  Eucerin Plus Crme Eucerin Plus Lotion  Eucerin TriLipid Replenishing Lotion  Keri Anti-Bacterial Hand Lotion  Keri Deep Conditioning Original Lotion Dry Skin Formula Softly Scented  Keri Deep Conditioning Original Lotion, Fragrance Free Sensitive Skin Formula  Keri Lotion Fast Absorbing Fragrance Free Sensitive  Skin Formula  Keri Lotion Fast Absorbing Softly Scented Dry Skin Formula  Keri Original Lotion  Keri Skin Renewal Lotion Keri Silky Smooth Lotion  Keri Silky Smooth Sensitive Skin Lotion  Nivea Body Creamy Conditioning Oil  Nivea Body Extra Enriched Lotion  Nivea Body Original Lotion  Nivea Body Sheer Moisturizing Lotion Nivea Crme  Nivea Skin Firming Lotion  NutraDerm 30 Skin Lotion  NutraDerm Skin Lotion  NutraDerm Therapeutic Skin Cream  NutraDerm Therapeutic Skin Lotion  ProShield Protective Hand Cream  Provon moisturizing lotion  Please read over the following fact sheets that you were given.

## 2023-07-19 NOTE — Progress Notes (Signed)
 PCP - Alyson Back, NP-C Cardiologist - denies  PPM/ICD - denies   Chest x-ray - 03/20/21 EKG - n/a Stress Test - denies. Pt states she had a "table tilt" test in [place]  4 yrs ago due to low HR, but everything was clear, no issues. ECHO - denies Cardiac Cath - denies  Sleep Study - denies   DM- denies  Last dose of GLP1 agonist-  n/a   ASA/Blood Thinner Instructions: n/a   ERAS Protcol - clears until 0610 PRE-SURGERY Ensure given  COVID TEST- n/a   Anesthesia review: no  Patient denies shortness of breath, fever, cough and chest pain at PAT appointment   All instructions explained to the patient, with a verbal understanding of the material. Patient agrees to go over the instructions while at home for a better understanding.  The opportunity to ask questions was provided.

## 2023-07-21 ENCOUNTER — Other Ambulatory Visit: Admitting: Radiology

## 2023-07-21 ENCOUNTER — Other Ambulatory Visit

## 2023-07-27 MED ORDER — TRANEXAMIC ACID 1000 MG/10ML IV SOLN
2000.0000 mg | INTRAVENOUS | Status: DC
  Filled 2023-07-27: qty 20

## 2023-07-28 ENCOUNTER — Ambulatory Visit (HOSPITAL_COMMUNITY): Payer: Self-pay | Admitting: Physician Assistant

## 2023-07-28 ENCOUNTER — Encounter (HOSPITAL_COMMUNITY)
Admission: RE | Disposition: A | Discharge status: Home / Self Care | Payer: Self-pay | Source: Home / Self Care | Attending: Orthopedic Surgery

## 2023-07-28 ENCOUNTER — Observation Stay (HOSPITAL_COMMUNITY)
Admission: RE | Admit: 2023-07-28 | Discharge: 2023-07-30 | Disposition: A | Discharge status: Home / Self Care | Attending: Orthopedic Surgery | Admitting: Orthopedic Surgery

## 2023-07-28 ENCOUNTER — Other Ambulatory Visit: Payer: Self-pay

## 2023-07-28 ENCOUNTER — Encounter (HOSPITAL_COMMUNITY): Payer: Self-pay | Admitting: Orthopedic Surgery

## 2023-07-28 ENCOUNTER — Ambulatory Visit (HOSPITAL_COMMUNITY)

## 2023-07-28 DIAGNOSIS — Z96652 Presence of left artificial knee joint: Secondary | ICD-10-CM

## 2023-07-28 DIAGNOSIS — M1712 Unilateral primary osteoarthritis, left knee: Principal | ICD-10-CM | POA: Insufficient documentation

## 2023-07-28 DIAGNOSIS — F419 Anxiety disorder, unspecified: Secondary | ICD-10-CM | POA: Diagnosis not present

## 2023-07-28 DIAGNOSIS — Z01818 Encounter for other preprocedural examination: Principal | ICD-10-CM

## 2023-07-28 HISTORY — PX: TOTAL KNEE ARTHROPLASTY: SHX125

## 2023-07-28 LAB — POCT PREGNANCY, URINE: Preg Test, Ur: NEGATIVE

## 2023-07-28 SURGERY — ARTHROPLASTY, KNEE, TOTAL
Anesthesia: Spinal | Site: Knee | Laterality: Left

## 2023-07-28 MED ORDER — DEXAMETHASONE SODIUM PHOSPHATE 10 MG/ML IJ SOLN
INTRAMUSCULAR | Status: AC
  Filled 2023-07-28: qty 1

## 2023-07-28 MED ORDER — SODIUM CHLORIDE (PF) 0.9 % IJ SOLN
INTRAMUSCULAR | Status: DC | PRN
  Administered 2023-07-28: 60 mL

## 2023-07-28 MED ORDER — HYDROMORPHONE HCL 1 MG/ML IJ SOLN
INTRAMUSCULAR | Status: AC
  Filled 2023-07-28: qty 0.5

## 2023-07-28 MED ORDER — ONDANSETRON HCL 4 MG/2ML IJ SOLN: 4.0000 mg | Freq: Four times a day (QID) | INTRAMUSCULAR | Status: DC | PRN

## 2023-07-28 MED ORDER — ONDANSETRON HCL 4 MG/2ML IJ SOLN
INTRAMUSCULAR | Status: DC | PRN
  Administered 2023-07-28: 4 mg via INTRAVENOUS

## 2023-07-28 MED ORDER — ORAL CARE MOUTH RINSE: 15.0000 mL | Freq: Once | OROMUCOSAL | Status: AC

## 2023-07-28 MED ORDER — MIDAZOLAM HCL 2 MG/2ML IJ SOLN: 2.0000 mg | Freq: Once | INTRAMUSCULAR | Status: AC

## 2023-07-28 MED ORDER — FENTANYL CITRATE (PF) 100 MCG/2ML IJ SOLN: 50.0000 ug | Freq: Once | INTRAMUSCULAR | Status: AC

## 2023-07-28 MED ORDER — DEXMEDETOMIDINE HCL IN NACL 80 MCG/20ML IV SOLN
INTRAVENOUS | Status: DC | PRN
Start: 2023-07-28 — End: 2023-07-28
  Administered 2023-07-28: 12 ug via INTRAVENOUS

## 2023-07-28 MED ORDER — MENTHOL 3 MG MT LOZG: 1.0000 | LOZENGE | OROMUCOSAL | Status: DC | PRN

## 2023-07-28 MED ORDER — FENTANYL CITRATE (PF) 250 MCG/5ML IJ SOLN
INTRAMUSCULAR | Status: AC
  Filled 2023-07-28: qty 5

## 2023-07-28 MED ORDER — PHENYLEPHRINE HCL-NACL 20-0.9 MG/250ML-% IV SOLN
INTRAVENOUS | Status: DC | PRN
  Administered 2023-07-28: 40 ug/min via INTRAVENOUS

## 2023-07-28 MED ORDER — MIDAZOLAM HCL 2 MG/2ML IJ SOLN
INTRAMUSCULAR | Status: DC | PRN
  Administered 2023-07-28: 1 mg via INTRAVENOUS

## 2023-07-28 MED ORDER — ACETAMINOPHEN 500 MG PO TABS
1000.0000 mg | ORAL_TABLET | Freq: Four times a day (QID) | ORAL | Status: AC
Start: 2023-07-28 — End: 2023-07-29
  Administered 2023-07-28 – 2023-07-29 (×4): 1000 mg via ORAL
  Filled 2023-07-28 (×4): qty 2

## 2023-07-28 MED ORDER — ROPIVACAINE HCL 5 MG/ML IJ SOLN
INTRAMUSCULAR | Status: DC | PRN
Start: 2023-07-28 — End: 2023-07-28
  Administered 2023-07-28: 30 mL via PERINEURAL

## 2023-07-28 MED ORDER — HYDROMORPHONE HCL 1 MG/ML IJ SOLN
INTRAMUSCULAR | Status: AC
  Filled 2023-07-28: qty 1

## 2023-07-28 MED ORDER — CHLORHEXIDINE GLUCONATE 0.12 % MT SOLN
15.0000 mL | Freq: Once | OROMUCOSAL | Status: AC
  Administered 2023-07-28: 15 mL via OROMUCOSAL
  Filled 2023-07-28: qty 15

## 2023-07-28 MED ORDER — ONDANSETRON HCL 4 MG PO TABS: 4.0000 mg | ORAL_TABLET | Freq: Four times a day (QID) | ORAL | Status: DC | PRN

## 2023-07-28 MED ORDER — BUPIVACAINE IN DEXTROSE 0.75-8.25 % IT SOLN
INTRATHECAL | Status: DC | PRN
  Administered 2023-07-28: 2 mL via INTRATHECAL

## 2023-07-28 MED ORDER — CELECOXIB 100 MG PO CAPS
100.0000 mg | ORAL_CAPSULE | Freq: Two times a day (BID) | ORAL | Status: DC
  Administered 2023-07-28 – 2023-07-30 (×5): 100 mg via ORAL
  Filled 2023-07-28 (×5): qty 1

## 2023-07-28 MED ORDER — METHOCARBAMOL 1000 MG/10ML IJ SOLN: 500.0000 mg | Freq: Four times a day (QID) | INTRAMUSCULAR | Status: DC | PRN

## 2023-07-28 MED ORDER — PHENYLEPHRINE HCL (PRESSORS) 10 MG/ML IV SOLN
INTRAVENOUS | Status: DC | PRN
  Administered 2023-07-28: 80 ug via INTRAVENOUS

## 2023-07-28 MED ORDER — SODIUM CHLORIDE 0.9 % IR SOLN
Status: DC | PRN
  Administered 2023-07-28: 3000 mL

## 2023-07-28 MED ORDER — ASPIRIN 81 MG PO CHEW
81.0000 mg | CHEWABLE_TABLET | Freq: Two times a day (BID) | ORAL | Status: DC
  Administered 2023-07-28 – 2023-07-30 (×4): 81 mg via ORAL
  Filled 2023-07-28 (×4): qty 1

## 2023-07-28 MED ORDER — MORPHINE SULFATE (PF) 4 MG/ML IV SOLN
INTRAVENOUS | Status: AC
  Filled 2023-07-28: qty 2

## 2023-07-28 MED ORDER — HYDROMORPHONE HCL 1 MG/ML IJ SOLN
0.2500 mg | INTRAMUSCULAR | Status: DC | PRN
  Administered 2023-07-28: 0.5 mg via INTRAVENOUS

## 2023-07-28 MED ORDER — MORPHINE SULFATE 4 MG/ML IJ SOLN
INTRAMUSCULAR | Status: DC | PRN
  Administered 2023-07-28: 13 mL

## 2023-07-28 MED ORDER — METOCLOPRAMIDE HCL 5 MG/ML IJ SOLN: 5.0000 mg | Freq: Three times a day (TID) | INTRAMUSCULAR | Status: DC | PRN

## 2023-07-28 MED ORDER — ACETAMINOPHEN 10 MG/ML IV SOLN: 1000.0000 mg | Freq: Once | INTRAVENOUS | Status: DC | PRN

## 2023-07-28 MED ORDER — POVIDONE-IODINE 10 % EX SWAB
2.0000 | Freq: Once | CUTANEOUS | Status: AC
  Administered 2023-07-28: 2 via TOPICAL

## 2023-07-28 MED ORDER — GLYCOPYRROLATE 0.2 MG/ML IJ SOLN
INTRAMUSCULAR | Status: DC | PRN
  Administered 2023-07-28: .2 mg via INTRAVENOUS

## 2023-07-28 MED ORDER — MIDAZOLAM HCL 2 MG/2ML IJ SOLN
INTRAMUSCULAR | Status: AC
  Filled 2023-07-28: qty 2

## 2023-07-28 MED ORDER — BUPIVACAINE LIPOSOME 1.3 % IJ SUSP
INTRAMUSCULAR | Status: AC
  Filled 2023-07-28: qty 20

## 2023-07-28 MED ORDER — DOCUSATE SODIUM 100 MG PO CAPS
100.0000 mg | ORAL_CAPSULE | Freq: Two times a day (BID) | ORAL | Status: DC
  Administered 2023-07-28 – 2023-07-30 (×5): 100 mg via ORAL
  Filled 2023-07-28 (×5): qty 1

## 2023-07-28 MED ORDER — CEFAZOLIN SODIUM-DEXTROSE 3-4 GM/150ML-% IV SOLN
3.0000 g | INTRAVENOUS | Status: AC
  Administered 2023-07-28: 3 g via INTRAVENOUS
  Filled 2023-07-28: qty 150

## 2023-07-28 MED ORDER — POVIDONE-IODINE 7.5 % EX SOLN
Freq: Once | CUTANEOUS | Status: DC
  Filled 2023-07-28: qty 118

## 2023-07-28 MED ORDER — VANCOMYCIN HCL 1000 MG IV SOLR
INTRAVENOUS | Status: AC
  Filled 2023-07-28: qty 20

## 2023-07-28 MED ORDER — MEPERIDINE HCL 25 MG/ML IJ SOLN: 6.2500 mg | INTRAMUSCULAR | Status: DC | PRN

## 2023-07-28 MED ORDER — POVIDONE-IODINE 10 % EX SWAB: 2.0000 | Freq: Once | CUTANEOUS | Status: DC

## 2023-07-28 MED ORDER — DULOXETINE HCL 60 MG PO CPEP
60.0000 mg | ORAL_CAPSULE | Freq: Every day | ORAL | Status: DC
  Administered 2023-07-29 – 2023-07-30 (×2): 60 mg via ORAL
  Filled 2023-07-28 (×2): qty 1

## 2023-07-28 MED ORDER — OXYCODONE HCL 5 MG PO TABS: 5.0000 mg | ORAL_TABLET | Freq: Once | ORAL | Status: DC | PRN

## 2023-07-28 MED ORDER — STERILE WATER FOR IRRIGATION IR SOLN
Status: DC | PRN
  Administered 2023-07-28: 1000 mL

## 2023-07-28 MED ORDER — CEFAZOLIN SODIUM-DEXTROSE 2-4 GM/100ML-% IV SOLN
2.0000 g | Freq: Three times a day (TID) | INTRAVENOUS | Status: AC
  Administered 2023-07-28 – 2023-07-29 (×2): 2 g via INTRAVENOUS
  Filled 2023-07-28 (×2): qty 100

## 2023-07-28 MED ORDER — TRANEXAMIC ACID-NACL 1000-0.7 MG/100ML-% IV SOLN
1000.0000 mg | INTRAVENOUS | Status: AC
  Administered 2023-07-28: 1000 mg via INTRAVENOUS
  Filled 2023-07-28: qty 100

## 2023-07-28 MED ORDER — PROPOFOL 10 MG/ML IV BOLUS
INTRAVENOUS | Status: DC | PRN
  Administered 2023-07-28 (×2): 30 mg via INTRAVENOUS

## 2023-07-28 MED ORDER — PROPOFOL 500 MG/50ML IV EMUL
INTRAVENOUS | Status: DC | PRN
  Administered 2023-07-28: 125 ug/kg/min via INTRAVENOUS

## 2023-07-28 MED ORDER — BUPIVACAINE HCL (PF) 0.25 % IJ SOLN
INTRAMUSCULAR | Status: AC
  Filled 2023-07-28: qty 30

## 2023-07-28 MED ORDER — PHENOL 1.4 % MT LIQD: 1.0000 | OROMUCOSAL | Status: DC | PRN

## 2023-07-28 MED ORDER — OXYCODONE HCL 5 MG/5ML PO SOLN: 5.0000 mg | Freq: Once | ORAL | Status: DC | PRN

## 2023-07-28 MED ORDER — VANCOMYCIN HCL 1000 MG IV SOLR
INTRAVENOUS | Status: DC | PRN
  Administered 2023-07-28: 1000 mg via TOPICAL

## 2023-07-28 MED ORDER — LACTATED RINGERS IV SOLN: INTRAVENOUS | Status: DC

## 2023-07-28 MED ORDER — ACETAMINOPHEN 325 MG PO TABS: 325.0000 mg | ORAL_TABLET | Freq: Four times a day (QID) | ORAL | Status: DC | PRN

## 2023-07-28 MED ORDER — 0.9 % SODIUM CHLORIDE (POUR BTL) OPTIME
TOPICAL | Status: DC | PRN
  Administered 2023-07-28: 1000 mL

## 2023-07-28 MED ORDER — METOCLOPRAMIDE HCL 5 MG PO TABS: 5.0000 mg | ORAL_TABLET | Freq: Three times a day (TID) | ORAL | Status: DC | PRN

## 2023-07-28 MED ORDER — HYDROMORPHONE HCL 1 MG/ML IJ SOLN
0.5000 mg | INTRAMUSCULAR | Status: DC | PRN
  Administered 2023-07-28 – 2023-07-29 (×3): 0.5 mg via INTRAVENOUS
  Filled 2023-07-28 (×3): qty 0.5

## 2023-07-28 MED ORDER — FENTANYL CITRATE (PF) 100 MCG/2ML IJ SOLN
INTRAMUSCULAR | Status: AC
  Administered 2023-07-28: 50 ug via INTRAVENOUS
  Filled 2023-07-28: qty 2

## 2023-07-28 MED ORDER — CLONIDINE HCL (ANALGESIA) 100 MCG/ML EP SOLN
EPIDURAL | Status: AC
  Filled 2023-07-28: qty 10

## 2023-07-28 MED ORDER — MIDAZOLAM HCL 2 MG/2ML IJ SOLN
INTRAMUSCULAR | Status: AC
  Administered 2023-07-28: 2 mg via INTRAVENOUS
  Filled 2023-07-28: qty 2

## 2023-07-28 MED ORDER — IRRISEPT - 450ML BOTTLE WITH 0.05% CHG IN STERILE WATER, USP 99.95% OPTIME
TOPICAL | Status: DC | PRN
  Administered 2023-07-28: 450 mL

## 2023-07-28 MED ORDER — ONDANSETRON HCL 4 MG/2ML IJ SOLN
INTRAMUSCULAR | Status: AC
  Filled 2023-07-28: qty 2

## 2023-07-28 MED ORDER — OXYCODONE HCL 5 MG PO TABS
5.0000 mg | ORAL_TABLET | ORAL | Status: DC | PRN
Start: 2023-07-28 — End: 2023-07-29
  Administered 2023-07-28 – 2023-07-29 (×3): 10 mg via ORAL
  Filled 2023-07-28 (×3): qty 2
  Filled 2023-07-28: qty 1

## 2023-07-28 MED ORDER — METHOCARBAMOL 500 MG PO TABS
500.0000 mg | ORAL_TABLET | Freq: Four times a day (QID) | ORAL | Status: DC | PRN
  Administered 2023-07-28 – 2023-07-29 (×3): 500 mg via ORAL
  Filled 2023-07-28 (×3): qty 1

## 2023-07-28 MED ORDER — DROPERIDOL 2.5 MG/ML IJ SOLN: 0.6250 mg | Freq: Once | INTRAMUSCULAR | Status: DC | PRN

## 2023-07-28 MED ORDER — TRANEXAMIC ACID 1000 MG/10ML IV SOLN
INTRAVENOUS | Status: DC | PRN
  Administered 2023-07-28: 2000 mg via TOPICAL

## 2023-07-28 MED ORDER — PROPOFOL 10 MG/ML IV BOLUS
INTRAVENOUS | Status: AC
  Filled 2023-07-28: qty 20

## 2023-07-28 SURGICAL SUPPLY — 70 items
BAG COUNTER SPONGE SURGICOUNT (BAG) ×1 IMPLANT
BAG DECANTER FOR FLEXI CONT (MISCELLANEOUS) ×1 IMPLANT
BANDAGE ESMARK 6X9 LF (GAUZE/BANDAGES/DRESSINGS) ×1 IMPLANT
BLADE SAG 18X100X1.27 (BLADE) ×1 IMPLANT
BLADE SAW SGTL 13X75X1.27 (BLADE) ×1 IMPLANT
BLADE SAW THK.89X75X18XSGTL (BLADE) ×1 IMPLANT
BNDG COHESIVE 6X5 TAN ST LF (GAUZE/BANDAGES/DRESSINGS) ×1 IMPLANT
BNDG ELASTIC 6X15 VLCR STRL LF (GAUZE/BANDAGES/DRESSINGS) ×1 IMPLANT
BOWL SMART MIX CTS (DISPOSABLE) IMPLANT
CNTNR URN SCR LID CUP LEK RST (MISCELLANEOUS) ×1 IMPLANT
COMPONENT FEM KNEE PS NRW 8 LT (Joint) IMPLANT
COMPONENT PATELLA PEG 3 32 (Joint) IMPLANT
COOLER ICEMAN CLASSIC (MISCELLANEOUS) IMPLANT
COVER SURGICAL LIGHT HANDLE (MISCELLANEOUS) ×1 IMPLANT
CUFF TOURN SGL QUICK 42 (TOURNIQUET CUFF) IMPLANT
CUFF TRNQT CYL 34X4.125X (TOURNIQUET CUFF) ×1 IMPLANT
DRAPE INCISE IOBAN 66X45 STRL (DRAPES) IMPLANT
DRAPE SURG ORHT 6 SPLT 77X108 (DRAPES) ×3 IMPLANT
DRAPE U-SHAPE 47X51 STRL (DRAPES) ×1 IMPLANT
DRSG AQUACEL AG ADV 3.5X14 (GAUZE/BANDAGES/DRESSINGS) IMPLANT
DURAPREP 26ML APPLICATOR (WOUND CARE) ×2 IMPLANT
ELECT CAUTERY BLADE 6.4 (BLADE) ×1 IMPLANT
ELECTRODE REM PT RTRN 9FT ADLT (ELECTROSURGICAL) ×1 IMPLANT
GAUZE SPONGE 4X4 12PLY STRL (GAUZE/BANDAGES/DRESSINGS) ×1 IMPLANT
GLOVE BIOGEL PI IND STRL 6.5 (GLOVE) ×1 IMPLANT
GLOVE BIOGEL PI IND STRL 7.0 (GLOVE) ×1 IMPLANT
GLOVE BIOGEL PI IND STRL 8 (GLOVE) ×1 IMPLANT
GLOVE ECLIPSE 7.0 STRL STRAW (GLOVE) ×1 IMPLANT
GLOVE ECLIPSE 8.0 STRL XLNG CF (GLOVE) ×1 IMPLANT
GLOVE SURG ENC MOIS LTX SZ6.5 (GLOVE) ×3 IMPLANT
GOWN STRL REUS W/ TWL LRG LVL3 (GOWN DISPOSABLE) ×3 IMPLANT
HOOD PEEL AWAY T7 (MISCELLANEOUS) ×3 IMPLANT
IMMOBILIZER KNEE 20 (SOFTGOODS) IMPLANT
IMMOBILIZER KNEE 20 THIGH 36 (SOFTGOODS) IMPLANT
IMMOBILIZER KNEE 22 UNIV (SOFTGOODS) IMPLANT
IMMOBILIZER KNEE 24 THIGH 36 (MISCELLANEOUS) IMPLANT
IMMOBILIZER KNEE 24 UNIV (MISCELLANEOUS) ×1 IMPLANT
INSERT ASF POLY 14 LT (Insert) IMPLANT
KIT BASIN OR (CUSTOM PROCEDURE TRAY) ×1 IMPLANT
KIT TURNOVER KIT B (KITS) ×1 IMPLANT
MANIFOLD NEPTUNE II (INSTRUMENTS) ×1 IMPLANT
NDL 22X1.5 STRL (OR ONLY) (MISCELLANEOUS) ×2 IMPLANT
NDL SPNL 18GX3.5 QUINCKE PK (NEEDLE) ×1 IMPLANT
NEEDLE 22X1.5 STRL (OR ONLY) (MISCELLANEOUS) ×2 IMPLANT
NEEDLE SPNL 18GX3.5 QUINCKE PK (NEEDLE) ×1 IMPLANT
NS IRRIG 1000ML POUR BTL (IV SOLUTION) ×2 IMPLANT
PACK TOTAL JOINT (CUSTOM PROCEDURE TRAY) ×1 IMPLANT
PAD ARMBOARD POSITIONER FOAM (MISCELLANEOUS) ×2 IMPLANT
PAD CAST 4YDX4 CTTN HI CHSV (CAST SUPPLIES) ×1 IMPLANT
PADDING CAST COTTON 6X4 STRL (CAST SUPPLIES) ×1 IMPLANT
PIN DRILL HDLS TROCAR 75 4PK (PIN) IMPLANT
SCREW FEMALE HEX FIX 25X2.5 (ORTHOPEDIC DISPOSABLE SUPPLIES) IMPLANT
SET HNDPC FAN SPRY TIP SCT (DISPOSABLE) ×1 IMPLANT
SPIKE FLUID TRANSFER (MISCELLANEOUS) ×1 IMPLANT
STEM TIB PERS SZ E 5D LT (Screw) IMPLANT
STRIP CLOSURE SKIN 1/2X4 (GAUZE/BANDAGES/DRESSINGS) ×2 IMPLANT
SUCTION TUBE FRAZIER 10FR DISP (SUCTIONS) ×1 IMPLANT
SUT ETHILON 3 0 PS 1 (SUTURE) IMPLANT
SUT MNCRL AB 3-0 PS2 18 (SUTURE) ×1 IMPLANT
SUT VIC AB 0 CT1 27XBRD ANBCTR (SUTURE) ×3 IMPLANT
SUT VIC AB 1 CT1 36 (SUTURE) ×5 IMPLANT
SUT VIC AB 2-0 CT2 27 (SUTURE) ×4 IMPLANT
SUT VICRYL 0 27 CT2 27 ABS (SUTURE) ×1 IMPLANT
SYR 30ML LL (SYRINGE) ×3 IMPLANT
SYR TB 1ML LUER SLIP (SYRINGE) ×1 IMPLANT
TOWEL GREEN STERILE (TOWEL DISPOSABLE) ×2 IMPLANT
TOWEL GREEN STERILE FF (TOWEL DISPOSABLE) ×2 IMPLANT
TRAY CATH INTERMITTENT SS 16FR (CATHETERS) IMPLANT
WATER STERILE IRR 1000ML POUR (IV SOLUTION) IMPLANT
YANKAUER SUCT BULB TIP NO VENT (SUCTIONS) ×1 IMPLANT

## 2023-07-28 NOTE — Transfer of Care (Signed)
 Immediate Anesthesia Transfer of Care Note  Patient: [name]  Procedure(s) Performed: ARTHROPLASTY, KNEE, TOTAL (Left: Knee)  Patient Location: PACU  Anesthesia Type:Spinal  Level of Consciousness: awake, oriented, and drowsy  Airway & Oxygen Therapy: Patient Spontanous Breathing  Post-op Assessment: Report given to RN and Post -op Vital signs reviewed and stable  Post vital signs: Reviewed and stable  Last Vitals:  Vitals Value Taken Time  BP 119/56 07/28/23 1245  Temp    Pulse 79 07/28/23 1248  Resp 17 07/28/23 1248  SpO2 99 % 07/28/23 1248  Vitals shown include unfiled device data.  Last Pain:  Vitals:   07/28/23 0747  TempSrc:   PainSc: 10-Worst pain ever      Patients Stated Pain Goal: 4 (07/28/23 0747)  Complications: No notable events documented.

## 2023-07-28 NOTE — Progress Notes (Signed)
 Orthopedic Tech Progress Note Patient Details:  Erica Fox 1985-07-02 [id number] Applied bone foam per order. We will apply CPM in the morning.  Ortho Devices Type of Ortho Device: Bone foam zero knee Ortho Device/Splint Location: LLE Ortho Device/Splint Interventions: Ordered, Application, Adjustment   Post Interventions Patient Tolerated: Well Instructions Provided: Adjustment of device, Care of device, Poper ambulation with device  Rayna Calkin 07/28/2023, 10:18 PM

## 2023-07-28 NOTE — H&P (Signed)
 TOTAL KNEE ADMISSION H&P  Patient is being admitted for left total knee arthroplasty.  Subjective:  Chief Complaint:left knee pain.  HPI: Erica Fox, 38 y.o. female, has a history of pain and functional disability in the left knee due to seronegative rheumatoid arthritis and has failed non-surgical conservative treatments for greater than 12 weeks to includeNSAID's and/or analgesics, corticosteriod injections, flexibility and strengthening excercises, and activity modification.  Onset of symptoms was gradual, starting 5 years ago with gradually worsening course since that time. The patient noted no past surgery on the left knee(s).  Patient currently rates pain in the left knee(s) at 9 out of 10 with activity. Patient has night pain, worsening of pain with activity and weight bearing, pain that interferes with activities of daily living, pain with passive range of motion, crepitus, and joint swelling.  Patient has evidence of near full-thickness cartilage loss on the medial side with patellofemoral arthritis also present.  No arthroscopically treatable pathology in the knee. by imaging studies. This patient has had a long course of nonoperative treatment.  In general her knee pain is severe.  It is affecting her daily life in a way that she cannot really go to work and cannot really do anything at the house in terms of standing and preparing meals or walking around without severe pain.  She is at the point where she wants predictable intervention.  She has been seen by her rheumatologist who has held her Biologics for approximately 5 days prior to the procedure.  No personal or family history of deep vein thrombosis or pulmonary embolism. There is no active infection.  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   left eye, dx by opthalmology   Degenerative joint disease    OCD (obsessive compulsive disorder) 2025   per patient   PTSD (post-traumatic stress disorder) 2025   per patient   Rheumatoid arthritis [facility])     Past Surgical History:  Procedure Laterality Date   CESAREAN SECTION     2007, 2012   EYE SURGERY Right    age 29, tighten muscle for lazy eye   FOOT SURGERY Left    hammertoe correction & screws placed (2 surgeries on left foot)   FOOT SURGERY Right 05/07/2022   hammertoe correction   TUBAL LIGATION     WISDOM TOOTH EXTRACTION  2014    Current Facility-Administered Medications  Medication Dose Route Frequency Provider Last Rate Last Admin   tranexamic acid (CYKLOKAPRON) 2,000 mg in sodium chloride 0.9 % 50 mL Topical Application  2,000 mg Topical To OR Jasmine Mesi, MD       Current Outpatient Medications  Medication Sig Dispense Refill Last Dose/Taking   B Complex Vitamins (VITAMIN B COMPLEX PO) Take 1 capsule by mouth in the morning.   Taking   Cholecalciferol (VITAMIN D -3 PO) Take 1,000 Units by mouth in the morning.   Taking   DULoxetine  (CYMBALTA ) 60 MG capsule Take 1 capsule (60 mg total) by mouth daily. 30 capsule 1 Taking   folic acid  (FOLVITE ) 1 MG tablet Take 2 tablets (2 mg total) by mouth daily. 180 tablet 3 Taking   levonorgestrel  (MIRENA ) 20 MCG/DAY IUD  1 each by Intrauterine route once. Inserted 02/2022 per patient   Taking   predniSONE  (DELTASONE ) 5 MG tablet Take 1 tablet (5 mg total) by mouth daily with breakfast. 30 tablet 0 Taking   RASUVO  25 MG/0.5ML SOAJ INJECT THE CONTENTS OF ONE  AUTO-INJECTOR SUBCUTANEOUSLY  WEEKLY AS DIRECTED (Patient taking differently: Inject 1 Dose into the skin every Tuesday.) 6 mL 0 Taking Differently   RINVOQ  15 MG TB24 TAKE 1 TABLET BY MOUTH DAILY 30 tablet 2 Taking   tirzepatide  (ZEPBOUND )  2.5 MG/0.5ML Pen Inject 2.5 mg into the skin once a week. (Patient not taking: Reported on 07/13/2023) 2 mL 1    Allergies  Allergen Reactions   Penicillins Other (See Comments)    Adopted mother informed her of allergy  but unaware of reaction.    Social History   Tobacco Use   Smoking status: Never    Passive exposure: Past   Smokeless tobacco: Never  Substance Use Topics   Alcohol use: Yes    Comment: maybe a drink once every 2 months    Family History  Problem Relation Age of Onset   Heart disease Mother    Breast cancer Paternal Aunt    Healthy Son    Healthy Son      Review of Systems  Musculoskeletal:  Positive for arthralgias.  All other systems reviewed and are negative.   Objective:  Physical Exam Vitals reviewed.  HENT:     Head: Normocephalic.     Nose: Nose normal.     Mouth/Throat:     Mouth: Mucous membranes are moist.  Eyes:     Pupils: Pupils are equal, round, and reactive to light.  Cardiovascular:     Rate and Rhythm: Normal rate.     Pulses: Normal pulses.  Pulmonary:     Effort: Pulmonary effort is normal.  Abdominal:     General: Abdomen is flat.  Musculoskeletal:     Cervical back: Normal range of motion.  Skin:    General: Skin is warm.     Capillary Refill: Capillary refill takes less than 2 seconds.  Neurological:     General: No focal deficit present.     Mental Status: She is alert.  Psychiatric:        Mood and Affect: Mood normal.    Ortho exam demonstrates mild synovitis in that left knee.  Collateral and cruciate ligaments are stable.  Pedal pulses intact.  Range of motion is full extension to about 120 of flexion.  Mild patellofemoral crepitus is present bilaterally.  Pedal pulses palpable.  Ankle dorsiflexion intact.  No groin pain with internal or external rotation of that left leg. Vital signs in last 24 hours:    Labs:   Estimated body mass index is 42.35 kg/m as calculated from the following:   Height as of  07/19/23: 5\' 8"  (1.727 m).   Weight as of 07/19/23: 126.3 kg.   Imaging Review Plain radiographs demonstrate mild degenerative joint disease of the left knee(s).  MRI scanning demonstrates full-thickness cartilage loss in the medial compartment.  The overall alignment isneutral. The bone quality appears to be good for age and reported activity level.      Assessment/Plan:  End stage arthritis, left knee   The patient history, physical examination, clinical judgment of the provider and imaging studies are consistent with end stage degenerative joint disease of the left knee(s) and total knee arthroplasty is deemed medically necessary. The treatment options including medical management, injection  therapy arthroscopy and arthroplasty were discussed at length. The risks and benefits of total knee arthroplasty were presented and reviewed. The risks due to aseptic loosening, infection, stiffness, patella tracking problems, thromboembolic complications and other imponderables were discussed. The patient acknowledged the explanation, agreed to proceed with the plan and consent was signed. Patient is being admitted for inpatient treatment for surgery, pain control, PT, OT, prophylactic antibiotics, VTE prophylaxis, progressive ambulation and ADL's and discharge planning. The patient is planning to be discharged home with home health services FurthermoreImpression is bilateral knee arthritis primarily in the medial compartment. Left knee is more symptomatic than the right knee. Not really having any mechanical symptoms but only pain which she localizes mostly to that medial side. At this time Janie is really looking for a complete solution to this problem. I do not think arthroscopic intervention would be predictably helpful for her. Nothing really to "debride or take out" at this time. Although she is young knee replacement would likely be the most predictable surgical outcome for her in terms of pain relief.  The difficulty of the rehab is also discussed. She would be at slightly higher risk because she is on Biologics for her autoimmune disease. In general the risk and benefits are discussed with Kenlyn including but not limited to infection or vessel damage incomplete pain relief as well as incomplete restoration of function and the possibility of revision in her lifetime. If bone quality allows we would use press-fit prosthesis. I think having the left knee functional and intact could also unload the right knee to some degree. All questions answered.     Patient's anticipated LOS is less than 2 midnights, meeting these requirements: - Younger than 18 - Lives within 1 hour of care - Has a competent adult at home to recover with post-op recover - NO history of  - Chronic pain requiring opiods  - Diabetes  - Coronary Artery Disease  - Heart failure  - Heart attack  - Stroke  - DVT/VTE  - Cardiac arrhythmia  - Respiratory Failure/COPD  - Renal failure  - Anemia  - Advanced Liver disease

## 2023-07-28 NOTE — Op Note (Signed)
 NAMENICOLET, NEGA MEDICAL RECORD NO: [id number] ACCOUNT NO: [id number] DATE OF BIRTH: Apr 21, 1985 FACILITY: [facility] LOCATION: [facility] PHYSICIAN: Gloriann Larger. Rozelle Corning, MD  Operative Report   DATE OF PROCEDURE: 07/28/2023  PREOPERATIVE DIAGNOSIS:  Left knee arthritis.  POSTOPERATIVE DIAGNOSIS:  Left knee arthritis.  PROCEDURE:  Left total knee replacement using press-fit components, 8 narrow Biomet Persona cruciate-retaining femur, E-spiked keel tibia press fit, 32-mm porous patella press fit, and 14-mm medial congruent bearing.  SURGEON:  Gloriann Larger.  Rozelle Corning, MD.  ASSISTANT:  Prentis Brock, PA.  INDICATIONS:  This is a 38 year old patient with seronegative rheumatoid arthritis and severe end-stage full-thickness medial compartment arthritis.  He has presented for operative management after explanation of the risks and benefits.  DESCRIPTION OF PROCEDURE: The patient was brought to the operating room where spinal anesthetic was induced.  Preoperative antibiotics were administered.  Timeout was called.  The left leg was prescrubbed with alcohol and Betadine allowed to air dry,  prepped with DuraPrep solution and draped in a sterile manner.  The patient had about 7 to 8 degrees of hyperextension in the left and right leg.  Collateral and cruciate ligaments were stable.  After prescrubbing and sterile prepping and draping, Ioban  was used to cover the operative field.  Timeout was called.  The leg was elevated and exsanguinated with Esmarch wrap.  A tourniquet was inflated.  An anterior approach to the knee was made.  Skin and subcutaneous tissue were sharply divided.  IrriSept  solution was utilized.  A median parapatellar arthrotomy was made and marked with #1 Vicryl suture.  IrriSept solution was used again in the joint.  The patella was everted.  Fat pad partially excised.  Lateral patellofemoral ligament was released.   There was joint fluid, which was present, but not too much in terms of overt inflamed  synovitis.  The soft tissue from the anterior distal femur was removed.  Bleeding points were encountered controlled using electrocautery.  Next, the anterior horn  lateral meniscus was released.  ACL was released.  The patient had full-thickness chondral flap defects on the medial femoral condyle involving about 75% of the condyle.  There was also full-thickness wear on the backside of the patella on both the  medial and lateral facets.  At this time, intramedullary alignment was utilized.  A posterior retractor was placed, and the collateral ligaments were protected with retractors.  An initial cut was made 2 mm off the most affected medial tibial plateau.   That was later increased 4 more mm in order to obtain an extension gap, which could accept an implant.  Intramedullary alignment was then used to cut the femur in 4 degrees of valgus.  The distal cut was made 10 mm.  The anterior and posterior chamfer  cuts were made in 3 degrees of external rotation.  We did use a size 8 femur, which we anticipated going to the narrow femur in order to avoid notching.  Bone quality was excellent.  At this time, we placed the tibial base plate in appropriate rotation  in line with the medial third of the tibial tubercle.  The femur was then placed.  Initially, before we took that 4 mm more cut on the proximal tibia, we were not able to get in a 10-mm spacer.  We then took 4 more millimeters off of the proximal tibia,  and that allowed a 12-mm spacer and 14-mm spacer to fit more easily.  The patella was then cut down from 25  to 15 mm, and a 32-mm press-fit patella was placed.  Very minimal lateral release was performed distally.  This allowed patellar tracking to occur  with no thumb technique.  The knee was taken through range of motion and found to have excellent range of motion with about 5 degrees of hyperextension with a 14-mm spacer in place.  Rotation was marked.  Final preparation was made on both the femur and   the tibia.  At this time, trial components were removed.  Thorough irrigation was performed using 3 liters of pulsatile irrigation.  The capsule was then anesthetized using Marcaine  saline and Exparel .  Next, we placed a TXA sponge as well as IrriSept  in the knee incision for 3 minutes.  This was removed.  Then, the implant was tapped into position on both the tibial and femoral side.  A 14 trial spacer was placed, and that gave about 5 degrees of hyperextension, which was essentially what she had  prior to surgery.  The patient had excellent patellar tracking and very good stability to varus and valgus stress at 0, 30, and 90 degrees.  The trial component was removed on the insert, and the true insert was placed.  The same stability parameters and  range of motion parameters were maintained.  No lift-off on the tibia with max flexion.  The tourniquet was released at this time.  Bleeding points were controlled using electrocautery.  Three liters of irrigating solution was utilized.  The median  parapatellar approach was then closed over a bolster using #1 Vicryl suture.  It should be noted that we placed IrriSept solution in the tibial canal, which was then suctioned out, and then we placed vancomycin prior to placing the tibial implant.  Next,  prior to final arthrotomy closure, we irrigated the knee out again with IrriSept solution, which was suctioned out, placed vancomycin powder in the joint, and then closed up the arthrotomy completely.  Injected the knee at that time with Marcaine ,  morphine, and clonidine.  The closure was then completed using 0 Vicryl suture, 2-0 Vicryl suture and 3-0 Monocryl with Steri-Strips and Aquacel dressing, Ace wrap, and Iceman and knee immobilizer placed.  Luke's assistance was required at all times for  retraction, opening and closing mobilization of tissue.  His assistance was of medical necessity.   PUS D: 07/28/2023 12:21:26 pm T: 07/28/2023 3:35:00 pm  JOB:  [id number]/ [id number]

## 2023-07-28 NOTE — Plan of Care (Signed)
 Progressing

## 2023-07-28 NOTE — Anesthesia Preprocedure Evaluation (Addendum)
 Anesthesia Evaluation  Patient identified by MRN, date of birth, ID band Patient awake    Reviewed: Allergy  & Precautions, NPO status , Patient's Chart, lab work & pertinent test results  Airway Mallampati: II  TM Distance: >3 FB Neck ROM: Full    Dental  (+) Teeth Intact, Dental Advisory Given   Pulmonary neg pulmonary ROS   breath sounds clear to auscultation       Cardiovascular negative cardio ROS  Rhythm:Regular Rate:Normal     Neuro/Psych  PSYCHIATRIC DISORDERS Anxiety     negative neurological ROS     GI/Hepatic negative GI ROS, Neg liver ROS,,,  Endo/Other  negative endocrine ROS    Renal/GU negative Renal ROS     Musculoskeletal  (+) Arthritis ,    Abdominal   Peds  Hematology  (+) Blood dyscrasia, anemia   Anesthesia Other Findings   Reproductive/Obstetrics                             Anesthesia Physical Anesthesia Plan  ASA: 3  Anesthesia Plan: Spinal   Post-op Pain Management: Regional block*   Induction: Intravenous  PONV Risk Score and Plan: 3 and Ondansetron , Dexamethasone  and Midazolam  Airway Management Planned: Natural Airway and Nasal Cannula  Additional Equipment: None  Intra-op Plan:   Post-operative Plan:   Informed Consent: I have reviewed the patients History and Physical, chart, labs and discussed the procedure including the risks, benefits and alternatives for the proposed anesthesia with the patient or authorized representative who has indicated his/her understanding and acceptance.       Plan Discussed with: CRNA  Anesthesia Plan Comments: (Lab Results      Component                Value               Date                      WBC                      8.5                 07/19/2023                HGB                      12.3                07/19/2023                HCT                      37.8                07/19/2023                MCV                       96.9                07/19/2023                PLT                      222  07/19/2023           )       Anesthesia Quick Evaluation

## 2023-07-28 NOTE — Brief Op Note (Signed)
   07/28/2023  12:13 PM  PATIENT:  Erica Fox  38 y.o. female  PRE-OPERATIVE DIAGNOSIS:  left knee osteoarthritis  POST-OPERATIVE DIAGNOSIS:  left knee osteoarthritis  PROCEDURE:  Procedure(s): ARTHROPLASTY, KNEE, TOTAL  SURGEON:  Surgeon(s): Rozelle Corning, Maricela Shoe, MD  ASSISTANT: magnant pa  ANESTHESIA:   spinal  EBL: 50 ml    Total I/O In: 100 [IV Piggyback:100] Out: -   BLOOD ADMINISTERED: none  DRAINS: none   LOCAL MEDICATIONS USED:  marcaine  morphine clonidine exparel   SPECIMEN:  No Specimen  COUNTS:  YES  TOURNIQUET:   Total Tourniquet Time Documented: Thigh (Left) - 109 minutes Total: Thigh (Left) - 109 minutes   DICTATION: .Other Dictation: Dictation Number [id number]  PLAN OF CARE: Admit for overnight observation  PATIENT DISPOSITION:  PACU - hemodynamically stable

## 2023-07-28 NOTE — Anesthesia Procedure Notes (Signed)
 Spinal  Start time: 07/28/2023 9:14 AM End time: 07/28/2023 9:16 AM Reason for block: surgical anesthesia Staffing Performed: anesthesiologist  Anesthesiologist: Willian Harrow, MD Performed by: Willian Harrow, MD Authorized by: Willian Harrow, MD   Preanesthetic Checklist Completed: patient identified, IV checked, site marked, risks and benefits discussed, surgical consent, monitors and equipment checked, pre-op evaluation and timeout performed Spinal Block Patient position: sitting Prep: DuraPrep and site prepped and draped Location: L3-4 Injection technique: single-shot Needle Needle type: Pencan  Needle gauge: 24 G Needle length: 10 cm Needle insertion depth: 10 cm Additional Notes Patient tolerated well. No immediate complications.  Functioning IV was confirmed and monitors were applied. Sterile prep and drape, including hand hygiene and sterile gloves were used. The patient was positioned and the back was prepped. The skin was anesthetized with lidocaine . Free flow of clear CSF was obtained prior to injecting local anesthetic into the CSF. The spinal needle aspirated freely following injection. The needle was carefully withdrawn. The patient tolerated the procedure well.

## 2023-07-28 NOTE — Anesthesia Postprocedure Evaluation (Addendum)
 Anesthesia Post Note  Patient: [name]  Procedure(s) Performed: ARTHROPLASTY, KNEE, TOTAL (Left: Knee)     Patient location during evaluation: PACU Anesthesia Type: Spinal Level of consciousness: oriented and awake and alert Pain management: pain level controlled Vital Signs Assessment: post-procedure vital signs reviewed and stable Respiratory status: spontaneous breathing, respiratory function stable and patient connected to nasal cannula oxygen Cardiovascular status: blood pressure returned to baseline and stable Postop Assessment: no headache, no backache and no apparent nausea or vomiting Anesthetic complications: no  No notable events documented.  Last Vitals:  Vitals:   07/28/23 1245 07/28/23 1300  BP: (!) 119/56 (!) 113/53  Pulse: 81 74  Resp: 16 12  Temp: 36.9 C   SpO2: 98% 99%                    Erica Fox

## 2023-07-28 NOTE — Evaluation (Signed)
 Physical Therapy Evaluation Patient Details Name: Erica Fox MRN: [id number] DOB: 1985/04/28 Today's Date: 07/28/2023  History of Present Illness  PAtient is a 38 y/o female admitted 07/28/23 for L TKA.  PMH positive for RA, B foot surgery for hammertoe, PTSD, OCD and ADHD.  Clinical Impression  Patient presents with decreased mobility due to pain in L knee, decreased ROM and strength and decreased activity tolerance.  Previously mobilizing independent without devices, today needing CGA for ambulation to bathroom with RW.  She will benefit from skilled PT in the acute setting and from post-acute follow up as per MD.          If plan is discharge home, recommend the following: A little help with walking and/or transfers;A little help with bathing/dressing/bathroom;Assistance with cooking/housework;Help with stairs or ramp for entrance;Assist for transportation   Can travel by private vehicle        Equipment Recommendations None recommended by PT  Recommendations for Other Services       Functional Status Assessment Patient has had a recent decline in their functional status and demonstrates the ability to make significant improvements in function in a reasonable and predictable amount of time.     Precautions / Restrictions Precautions Precautions: Knee Required Braces or Orthoses: Knee Immobilizer - Left Restrictions Weight Bearing Restrictions Per Provider Order: No Other Position/Activity Restrictions: WBAT      Mobility  Bed Mobility Overal bed mobility: Needs Assistance Bed Mobility: Supine to Sit     Supine to sit: Min assist, Used rails     General bed mobility comments: assist for L LE    Transfers Overall transfer level: Needs assistance Equipment used: Rolling walker (2 wheels) Transfers: Sit to/from Stand Sit to Stand: Min assist, From elevated surface           General transfer comment: cues for technique, assist for balance heavy UE use     Ambulation/Gait Ambulation/Gait assistance: Contact guard assist Gait Distance (Feet): 20 Feet (x 2) Assistive device: Rolling walker (2 wheels) Gait Pattern/deviations: Step-to pattern, Decreased stride length, Antalgic, Decreased weight shift to left       General Gait Details: cues for sequence, assist for balance, to bathroom then to recliner in room  Stairs            Wheelchair Mobility     Tilt Bed    Modified Rankin (Stroke Patients Only)       Balance Overall balance assessment: Needs assistance Sitting-balance support: Feet supported Sitting balance-Leahy Scale: Good Sitting balance - Comments: toileted on 3:1 over toilet completed hygiene unaided   Standing balance support: No upper extremity supported, During functional activity Standing balance-Leahy Scale: Fair Standing balance comment: washing hands at sink                             Pertinent Vitals/Pain Pain Assessment Pain Assessment: 0-10 Pain Score: 10-Worst pain ever Pain Location: L knee Pain Descriptors / Indicators: Sharp, Burning, Throbbing Pain Intervention(s): Monitored during session, Repositioned    Home Living Family/patient expects to be discharged to:: Private residence Living Arrangements: Spouse/significant other Available Help at Discharge: Family   Home Access: Stairs to enter   Secretary/administrator of Steps: 1   Home Layout: One level Home Equipment: Agricultural consultant (2 wheels);Shower seat;Hand held shower head;BSC/3in1      Prior Function Prior Level of Function : Independent/Modified Independent  Mobility Comments: works in Scientist, research (life sciences) Extremity Assessment Upper Extremity Assessment: LUE deficits/detail;RUE deficits/detail RUE Deficits / Details: bilat wrist fusion; otherwise WFL LUE Deficits / Details: bilat wrist fusion; otherwise WFL    Lower Extremity Assessment Lower  Extremity Assessment: LLE deficits/detail LLE Deficits / Details: AAROM flexion at knee approx 40 in supine, mild deficit to full extension with ace wrap on, ankle AROM WFL, knee extension at least 2/5 LLE Sensation: decreased light touch (some residual from nerve block)       Communication   Communication Factors Affecting Communication: Hearing impaired    Cognition Arousal: Alert Behavior During Therapy: WFL for tasks assessed/performed   PT - Cognitive impairments: No apparent impairments                         Following commands: Intact       Cueing Cueing Techniques: Verbal cues     General Comments      Exercises Total Joint Exercises Ankle Circles/Pumps: AROM, Both, 10 reps, Supine Quad Sets: AROM, Left, 5 reps, Supine Heel Slides: AROM, AAROM, Left, 5 reps, Supine   Assessment/Plan    PT Assessment Patient needs continued PT services  PT Problem List Decreased range of motion;Decreased strength;Decreased mobility;Decreased activity tolerance;Decreased knowledge of use of DME;Pain       PT Treatment Interventions DME instruction;Therapeutic exercise;Gait training;Stair training;Functional mobility training;Therapeutic activities;Patient/family education    PT Goals (Current goals can be found in the Care Plan section)  Acute Rehab PT Goals Patient Stated Goal: return to work soon PT Goal Formulation: With patient Time For Goal Achievement: 08/04/23 Potential to Achieve Goals: Good    Frequency 7X/week     Co-evaluation               AM-PAC PT "6 Clicks" Mobility  Outcome Measure Help needed turning from your back to your side while in a flat bed without using bedrails?: None Help needed moving from lying on your back to sitting on the side of a flat bed without using bedrails?: A Little Help needed moving to and from a bed to a chair (including a wheelchair)?: A Little Help needed standing up from a chair using your arms (e.g.,  wheelchair or bedside chair)?: A Little Help needed to walk in hospital room?: A Little Help needed climbing 3-5 steps with a railing? : Total 6 Click Score: 17    End of Session Equipment Utilized During Treatment: Gait belt;Left knee immobilizer Activity Tolerance: Patient limited by pain Patient left: in chair;with call bell/phone within reach   PT Visit Diagnosis: Difficulty in walking, not elsewhere classified (R26.2);Pain Pain - Right/Left: Left Pain - part of body: Knee    Time: 4540-9811 PT Time Calculation (min) (ACUTE ONLY): 27 min   Charges:   PT Evaluation $PT Eval Low Complexity: 1 Low PT Treatments $Gait Training: 8-22 mins PT General Charges $$ ACUTE PT VISIT: 1 Visit         Abigail Hoff, PT Acute Rehabilitation Services Office:[phone] 07/28/2023   [name] 07/28/2023, 5:43 PM

## 2023-07-28 NOTE — Anesthesia Procedure Notes (Signed)
 Anesthesia Regional Block: Adductor canal block   Pre-Anesthetic Checklist: , timeout performed,  Correct Patient, Correct Site, Correct Laterality,  Correct Procedure, Correct Position, site marked,  Risks and benefits discussed,  Surgical consent,  Pre-op evaluation,  At surgeon's request and post-op pain management  Laterality: Left  Prep: chloraprep       Needles:  Injection technique: Single-shot  Needle Type: Echogenic Stimulator Needle     Needle Length: 9cm  Needle Gauge: 21     Additional Needles:   Procedures:,,,, ultrasound used (permanent image in chart),,    Narrative:  Start time: 07/28/2023 8:35 AM End time: 07/28/2023 8:40 AM Injection made incrementally with aspirations every 5 mL.  Performed by: Personally  Anesthesiologist: Willian Harrow, MD  Additional Notes: Discussed risks and benefits of the nerve block in detail, including but not limited vascular injury, permanent nerve damage and infection.   Patient tolerated the procedure well. Local anesthetic introduced in an incremental fashion under minimal resistance after negative aspirations. No paresthesias were elicited. After completion of the procedure, no acute issues were identified and patient continued to be monitored by RN.

## 2023-07-29 ENCOUNTER — Encounter (HOSPITAL_COMMUNITY): Payer: Self-pay | Admitting: Orthopedic Surgery

## 2023-07-29 ENCOUNTER — Other Ambulatory Visit: Payer: Self-pay | Admitting: Physician Assistant

## 2023-07-29 DIAGNOSIS — M1712 Unilateral primary osteoarthritis, left knee: Secondary | ICD-10-CM | POA: Diagnosis not present

## 2023-07-29 MED ORDER — HYDROMORPHONE HCL 1 MG/ML IJ SOLN
0.5000 mg | INTRAMUSCULAR | Status: DC | PRN
  Administered 2023-07-29 – 2023-07-30 (×3): 0.5 mg via INTRAVENOUS
  Filled 2023-07-29 (×4): qty 0.5

## 2023-07-29 MED ORDER — HYDROMORPHONE HCL 2 MG PO TABS: 2.0000 mg | ORAL_TABLET | ORAL | Status: DC

## 2023-07-29 MED ORDER — HYDROCODONE-ACETAMINOPHEN 10-325 MG PO TABS
1.0000 | ORAL_TABLET | ORAL | Status: DC
  Administered 2023-07-29 – 2023-07-30 (×7): 1 via ORAL
  Filled 2023-07-29 (×7): qty 1

## 2023-07-29 MED ORDER — HYDROMORPHONE HCL 1 MG/ML IJ SOLN
INTRAMUSCULAR | Status: AC
  Administered 2023-07-29: 0.5 mg via INTRAVENOUS
  Filled 2023-07-29: qty 0.5

## 2023-07-29 MED ORDER — OXYCODONE HCL ER 10 MG PO T12A
10.0000 mg | EXTENDED_RELEASE_TABLET | Freq: Two times a day (BID) | ORAL | Status: DC
  Administered 2023-07-29 – 2023-07-30 (×3): 10 mg via ORAL
  Filled 2023-07-29 (×3): qty 1

## 2023-07-29 MED ORDER — HYDROMORPHONE HCL 1 MG/ML IJ SOLN: 0.5000 mg | INTRAMUSCULAR | Status: DC

## 2023-07-29 NOTE — Care Management (Signed)
  Transition of Care [facility]) Screening Note   Patient Details  Name: Erica Fox Date of Birth: Jan 08, 1986   [facility]) CM/SW Contact:    Ronni Colace, RN Phone Number: 07/29/2023, 2:23 PM    Transition of Care Department [facility]) has reviewed patient and no TOC needs have been identified at this time. We will continue to monitor patient advancement through interdisciplinary progression rounds. If new patient transition needs arise, please place a TOC consult.

## 2023-07-29 NOTE — Progress Notes (Signed)
  Subjective: Pt  stable but having pain relieved best with dilaudid    Objective: Vital signs in last 24 hours: Temp:  [97.8 F (36.6 C)-98.5 F (36.9 C)] 98.3 F (36.8 C) (05/09 0459) Pulse Rate:  [67-81] 68 (05/09 0459) Resp:  [11-24] 18 (05/09 0459) BP: (102-149)/(53-120) 111/63 (05/09 0459) SpO2:  [94 %-100 %] 99 % (05/09 0459) Weight:  [126.1 kg] 126.1 kg (05/08 0717)  Intake/Output from previous day: 05/08 0701 - 05/09 0700 In: 1200 [I.V.:900; IV Piggyback:300] Out: 950 [Urine:900; Blood:50] Intake/Output this shift: No intake/output data recorded.  Exam:  Sensation intact distally Intact pulses distally Dorsiflexion/Plantar flexion intact  Labs: No results for input(s): "HGB" in the last 72 hours. No results for input(s): "WBC", "RBC", "HCT", "PLT" in the last 72 hours. No results for input(s): "NA", "K", "CL", "CO2", "BUN", "CREATININE", "GLUCOSE", "CALCIUM" in the last 72 hours. No results for input(s): "LABPT", "INR" in the last 72 hours.  Assessment/Plan: Plan to add long acting oxy and oral dilaudid  with iv for breakthrough. May or may not be able to go today   [employer] 07/29/2023, 7:11 AM

## 2023-07-29 NOTE — Progress Notes (Signed)
 Physical Therapy Treatment Patient Details Name: Erica Fox MRN: [id number] DOB: 07-13-1985 Today's Date: 07/29/2023   History of Present Illness PAtient is a 38 y/o female admitted 07/28/23 for L TKA.  PMH positive for RA, B foot surgery for hammertoe, PTSD, OCD and ADHD.    PT Comments  Patient progressing slowly with pain L LE and poor weight tolerance.  She walked to bathroom then to door and back to chair c/o hot and shaky though VSS once seated in recliner though she was pale.  Patient eager to progress ROM though limited with swelling and pain.  PT will continue to follow.     If plan is discharge home, recommend the following: A little help with walking and/or transfers;A little help with bathing/dressing/bathroom;Assistance with cooking/housework;Help with stairs or ramp for entrance;Assist for transportation   Can travel by private vehicle        Equipment Recommendations  None recommended by PT    Recommendations for Other Services       Precautions / Restrictions Precautions Precautions: Knee Recall of Precautions/Restrictions: Intact Required Braces or Orthoses: Knee Immobilizer - Left Restrictions Weight Bearing Restrictions Per Provider Order: No Other Position/Activity Restrictions: WBAT     Mobility  Bed Mobility Overal bed mobility: Needs Assistance Bed Mobility: Supine to Sit     Supine to sit: Min assist, Used rails          Transfers Overall transfer level: Needs assistance Equipment used: Rolling walker (2 wheels) Transfers: Sit to/from Stand Sit to Stand: Contact guard assist           General transfer comment: A for balance    Ambulation/Gait Ambulation/Gait assistance: Contact guard assist Gait Distance (Feet): 35 Feet Assistive device: Rolling walker (2 wheels) Gait Pattern/deviations: Step-to pattern, Decreased stride length, Antalgic       General Gait Details: limited step length on R due to pain on L, walked without  immobilizer, no buckling, though pt relates prefers to use next time   Stairs             Wheelchair Mobility     Tilt Bed    Modified Rankin (Stroke Patients Only)       Balance Overall balance assessment: Needs assistance Sitting-balance support: Feet supported Sitting balance-Leahy Scale: Good     Standing balance support: Single extremity supported, No upper extremity supported, Bilateral upper extremity supported Standing balance-Leahy Scale: Fair Standing balance comment: UE support for functional task toilet hygiene in standing with 1 UE support, washed hands at sink no UE support                            Communication Communication Communication: No apparent difficulties  Cognition Arousal: Alert Behavior During Therapy: WFL for tasks assessed/performed   PT - Cognitive impairments: No apparent impairments                         Following commands: Intact      Cueing Cueing Techniques: Verbal cues  Exercises Total Joint Exercises Ankle Circles/Pumps: AROM, Both, 10 reps, Supine Quad Sets: AROM, Left, 5 reps, Supine Short Arc Quad: AROM, AAROM, Left, 5 reps, Supine Heel Slides: AROM, AAROM, Left, Supine, Other reps (comment) (8 reps) Hip ABduction/ADduction: AROM, AAROM, Left, 5 reps, Supine Straight Leg Raises: AAROM, Left, Supine, 5 reps    General Comments General comments (skin integrity, edema, etc.): c/o hot and feeling shaky walking back  from door to chair BP seated 135/73; HR 67      Pertinent Vitals/Pain Pain Assessment Pain Score: 9  Pain Location: L knee Pain Descriptors / Indicators: Sharp, Burning, Throbbing Pain Intervention(s): Monitored during session, Repositioned, Ice applied, RN gave pain meds during session    Home Living                          Prior Function            PT Goals (current goals can now be found in the care plan section) Progress towards PT goals: Progressing toward  goals    Frequency    7X/week      PT Plan      Co-evaluation              AM-PAC PT "6 Clicks" Mobility   Outcome Measure  Help needed turning from your back to your side while in a flat bed without using bedrails?: None Help needed moving from lying on your back to sitting on the side of a flat bed without using bedrails?: A Little Help needed moving to and from a bed to a chair (including a wheelchair)?: A Little Help needed standing up from a chair using your arms (e.g., wheelchair or bedside chair)?: A Little Help needed to walk in hospital room?: A Little Help needed climbing 3-5 steps with a railing? : Total 6 Click Score: 17    End of Session Equipment Utilized During Treatment: Gait belt Activity Tolerance: Patient limited by pain Patient left: in chair;with call bell/phone within reach   PT Visit Diagnosis: Difficulty in walking, not elsewhere classified (R26.2);Pain Pain - Right/Left: Left Pain - part of body: Knee     Time: 1025-1055 PT Time Calculation (min) (ACUTE ONLY): 30 min  Charges:    $Gait Training: 8-22 mins $Therapeutic Exercise: 8-22 mins PT General Charges $$ ACUTE PT VISIT: 1 Visit                     Abigail Hoff, PT Acute Rehabilitation Services Office:[phone] 07/29/2023    [name] 07/29/2023, 1:08 PM

## 2023-07-29 NOTE — Telephone Encounter (Signed)
 Last Fill: 06/27/2023  Next Visit: 10/12/2023  Last Visit: 07/13/2023  Dx: Rheumatoid arthritis of multiple sites with negative rheumatoid factor   Current Dose per my chart note on 06/24/2023: prednisone  5 mg 1 tablet daily   Okay to refill Prednisone ?

## 2023-07-29 NOTE — Progress Notes (Signed)
 Physical Therapy Treatment Patient Details Name: Erica Fox MRN: [id number] DOB: August 25, 1985 Today's Date: 07/29/2023   History of Present Illness PAtient is a 38 y/o female admitted 07/28/23 for L TKA.  PMH positive for RA, B foot surgery for hammertoe, PTSD, OCD and ADHD.    PT Comments  Patient able to get into hallway for ambulation and able to educate spouse in assistance for mobility at home.  She felt able to put more weight on her L LE this pm as well.  Using knee immobilizer for support and educated on frequent short bouts of ambulation, use of ice and pain meds to help prior to and after exercise.  Feel she should be stable for home with spouse assist after session in am to negotiate one step for home entry.      If plan is discharge home, recommend the following: A little help with walking and/or transfers;A little help with bathing/dressing/bathroom;Assistance with cooking/housework;Help with stairs or ramp for entrance;Assist for transportation   Can travel by private vehicle        Equipment Recommendations  None recommended by PT    Recommendations for Other Services       Precautions / Restrictions Precautions Precautions: Knee Recall of Precautions/Restrictions: Intact Required Braces or Orthoses: Knee Immobilizer - Left Restrictions Other Position/Activity Restrictions: WBAT     Mobility  Bed Mobility Overal bed mobility: Needs Assistance Bed Mobility: Sit to Supine, Supine to Sit     Supine to sit: Min assist Sit to supine: Min assist   General bed mobility comments: assist for L LE    Transfers Overall transfer level: Needs assistance Equipment used: Rolling walker (2 wheels) Transfers: Sit to/from Stand Sit to Stand: Supervision, Contact guard assist, From elevated surface           General transfer comment: from higher bed surface (has high bed at home) and 3:1 over toilet    Ambulation/Gait Ambulation/Gait assistance: Supervision Gait  Distance (Feet): 90 Feet Assistive device: Rolling walker (2 wheels) Gait Pattern/deviations: Step-to pattern, Step-through pattern, Decreased stride length, Antalgic       General Gait Details: kept fan on her during ambulation, no c/o feeling weak, though hot throughout, S for safety   Stairs             Wheelchair Mobility     Tilt Bed    Modified Rankin (Stroke Patients Only)       Balance Overall balance assessment: Needs assistance Sitting-balance support: Feet supported Sitting balance-Leahy Scale: Good     Standing balance support: During functional activity, No upper extremity supported Standing balance-Leahy Scale: Fair Standing balance comment: brushed teeth, washed hands at sink                            Communication    Cognition Arousal: Alert Behavior During Therapy: WFL for tasks assessed/performed   PT - Cognitive impairments: No apparent impairments                         Following commands: Intact      Cueing Cueing Techniques: Verbal cues  Exercises Total Joint Exercises Ankle Circles/Pumps: AROM, Both, 5 reps, Supine Quad Sets: AROM, Left, 5 reps, Supine Short Arc Quad: AROM, AAROM, Left, 5 reps, Supine Heel Slides: AROM, Left, Seated, 10 reps Hip ABduction/ADduction: AROM, AAROM, Left, 5 reps, Supine Straight Leg Raises: AAROM, Left, Supine, 5 reps  General Comments General comments (skin integrity, edema, etc.): spouse present and reviewed car transfers, assist for leg with bed mobility, assist for certain HEP exercises, using ice machine      Pertinent Vitals/Pain Pain Assessment Pain Score: 8  Pain Location: L knee Pain Descriptors / Indicators: Sharp, Throbbing, Aching Pain Intervention(s): Monitored during session, Repositioned, Ice applied    Home Living                          Prior Function            PT Goals (current goals can now be found in the care plan section)  Progress towards PT goals: Progressing toward goals    Frequency    7X/week      PT Plan      Co-evaluation              AM-PAC PT "6 Clicks" Mobility   Outcome Measure  Help needed turning from your back to your side while in a flat bed without using bedrails?: None Help needed moving from lying on your back to sitting on the side of a flat bed without using bedrails?: A Little Help needed moving to and from a bed to a chair (including a wheelchair)?: A Little Help needed standing up from a chair using your arms (e.g., wheelchair or bedside chair)?: A Little Help needed to walk in hospital room?: A Little Help needed climbing 3-5 steps with a railing? : Total 6 Click Score: 17    End of Session Equipment Utilized During Treatment: Gait belt Activity Tolerance: Patient tolerated treatment well Patient left: in bed;with call bell/phone within reach   PT Visit Diagnosis: Difficulty in walking, not elsewhere classified (R26.2);Pain Pain - Right/Left: Left Pain - part of body: Knee     Time: 1555-1640 PT Time Calculation (min) (ACUTE ONLY): 45 min  Charges:    $Gait Training: 8-22 mins $Therapeutic Activity: 23-37 mins PT General Charges $$ ACUTE PT VISIT: 1 Visit                     Abigail Hoff, PT Acute Rehabilitation Services [phone] 07/29/2023    Erica Fox 07/29/2023, 6:13 PM

## 2023-07-30 ENCOUNTER — Other Ambulatory Visit (HOSPITAL_COMMUNITY): Payer: Self-pay

## 2023-07-30 DIAGNOSIS — M1712 Unilateral primary osteoarthritis, left knee: Secondary | ICD-10-CM | POA: Diagnosis not present

## 2023-07-30 MED ORDER — CELECOXIB 100 MG PO CAPS: 100.0000 mg | ORAL_CAPSULE | Freq: Two times a day (BID) | ORAL | 0 refills | Status: DC

## 2023-07-30 MED ORDER — HYDROMORPHONE HCL 2 MG PO TABS
3.0000 mg | ORAL_TABLET | ORAL | 0 refills | Status: DC | PRN
  Filled 2023-07-30: qty 30, 4d supply, fill #0

## 2023-07-30 MED ORDER — METHOCARBAMOL 500 MG PO TABS: 500.0000 mg | ORAL_TABLET | Freq: Four times a day (QID) | ORAL | 0 refills | Status: DC | PRN

## 2023-07-30 MED ORDER — HYDROMORPHONE HCL 2 MG PO TABS
3.0000 mg | ORAL_TABLET | ORAL | 0 refills | Status: DC | PRN
  Filled 2023-07-30: qty 30, 5d supply, fill #0

## 2023-07-30 MED ORDER — DOCUSATE SODIUM 100 MG PO CAPS: 100.0000 mg | ORAL_CAPSULE | Freq: Two times a day (BID) | ORAL | 0 refills | Status: DC

## 2023-07-30 MED ORDER — ASPIRIN 81 MG PO CHEW: 81.0000 mg | CHEWABLE_TABLET | Freq: Two times a day (BID) | ORAL | 0 refills | Status: DC

## 2023-07-30 MED ORDER — HYDROMORPHONE HCL 2 MG PO TABS: 3.0000 mg | ORAL_TABLET | ORAL | 0 refills | Status: DC | PRN

## 2023-07-30 NOTE — Progress Notes (Signed)
 Contacted [facility] to cancel Dilaudid  prescription since MD sent it to a different pharmacy.

## 2023-07-30 NOTE — TOC Progression Note (Addendum)
 Transition of Care [facility]) - Progression Note    Patient Details  Name: Erica Fox MRN: [id number] Date of Birth: 12/21/1985  [facility]) CM/SW Contact  Jannine Meo, RN Phone Number: 07/30/2023, 8:35 AM  Clinical Narrative:   Message to Pattie Borders with [facility] to follow up on referral made from Ortho office. Unable to accept patient due to payor source not in network.   0845: Out of area for [facility]. Will fax referral to [facility].         Expected Discharge Plan and Services         Expected Discharge Date: 07/30/23                                     Social Determinants of Health (SDOH) Interventions SDOH Screenings   Food Insecurity: No Food Insecurity (07/28/2023)  Housing: Low Risk  (07/28/2023)  Transportation Needs: No Transportation Needs (07/28/2023)  Utilities: Not At Risk (07/28/2023)  Alcohol Screen: Low Risk  (07/03/2023)  Depression (PHQ2-9): Low Risk  (07/05/2023)  Financial Resource Strain: Low Risk  (07/03/2023)  Physical Activity: Unknown (07/03/2023)  Social Connections: Socially Isolated (07/03/2023)  Stress: Stress Concern Present (07/03/2023)  Tobacco Use: Low Risk  (07/28/2023)    Readmission Risk Interventions     No data to display

## 2023-07-30 NOTE — Progress Notes (Signed)
  Subjective: Patient stable.  Pain is better controlled today.  She tends to do best with Dilaudid .  The hydrocodone is a little bit more effective than the oxycodone .   Objective: Vital signs in last 24 hours: Temp:  [97.5 F (36.4 C)-98.7 F (37.1 C)] 97.5 F (36.4 C) (05/10 0425) Pulse Rate:  [66-83] 83 (05/10 0425) Resp:  [16] 16 (05/10 0425) BP: (113-131)/(59-67) 113/66 (05/10 0425) SpO2:  [98 %-99 %] 99 % (05/10 0425)  Intake/Output from previous day: 05/09 0701 - 05/10 0700 In: 200 [P.O.:200] Out: -  Intake/Output this shift: No intake/output data recorded.  Exam:  Sensation intact distally Intact pulses distally Dorsiflexion/Plantar flexion intact  Labs: No results for input(s): "HGB" in the last 72 hours. No results for input(s): "WBC", "RBC", "HCT", "PLT" in the last 72 hours. No results for input(s): "NA", "K", "CL", "CO2", "BUN", "CREATININE", "GLUCOSE", "CALCIUM" in the last 72 hours. No results for input(s): "LABPT", "INR" in the last 72 hours.  Assessment/Plan: Plan at this time is physical therapy this morning.  We will plan for discharge later this morning.  Weightbearing as tolerated with aspirin  for DVT prophylaxis and CPM machine at home.   G Scott Shaunna Rosetti 07/30/2023, 7:23 AM

## 2023-07-30 NOTE — Progress Notes (Addendum)
 Physical Therapy Treatment Patient Details Name: Erica Fox MRN: [id number] DOB: 09/02/1985 Today's Date: 07/30/2023   History of Present Illness PAtient is a 39 y/o female admitted 07/28/23 for L TKA.  PMH positive for RA, B foot surgery for hammertoe, PTSD, OCD and ADHD.    PT Comments  Pt resting in bed on arrival and agreeable to session with continued progress towards acute goals. Session focused on continued gait training and stair training for increased activity tolerance and increased safety with mobility with transition to home. Pt able to complete bed mobility with min A to manage LLE to and off EOB with use of gait belt as leg lifter. Pt demonstrating transfers, gait and stair negotiation with grossly CGA for safety. Continued education on safe car entry/exit, HEP and importance of compliance, appropriate activity progression, continued RW use, no pillow under knee, and KI use with pt verbalizing understanding of all. Anticipate safe discharge, with assist level outlined below, once medically cleared, will continue to follow acutely.     If plan is discharge home, recommend the following: A little help with walking and/or transfers;A little help with bathing/dressing/bathroom;Assistance with cooking/housework;Help with stairs or ramp for entrance;Assist for transportation   Can travel by private vehicle        Equipment Recommendations  None recommended by PT    Recommendations for Other Services       Precautions / Restrictions Precautions Precautions: Knee Recall of Precautions/Restrictions: Intact Required Braces or Orthoses: Knee Immobilizer - Left Restrictions Weight Bearing Restrictions Per Provider Order: No Other Position/Activity Restrictions: WBAT     Mobility  Bed Mobility Overal bed mobility: Needs Assistance Bed Mobility: Sit to Supine, Supine to Sit     Supine to sit: Min assist Sit to supine: Min assist   General bed mobility comments: assist for L LE,  issued gait belt for pt to utilize as leg lifter with good return    Transfers Overall transfer level: Needs assistance Equipment used: Rolling walker (2 wheels) Transfers: Sit to/from Stand Sit to Stand: Contact guard assist, From elevated surface           General transfer comment: from slightly elevated EOB, CGA for safety    Ambulation/Gait Ambulation/Gait assistance: Supervision Gait Distance (Feet): 65 Feet Assistive device: Rolling walker (2 wheels) Gait Pattern/deviations: Step-to pattern, Step-through pattern, Decreased stride length, Antalgic Gait velocity: decr     General Gait Details: shorter distance for focus on stair training, kept fan on for comfort, short step to pattern with limited WBing on L   Stairs Stairs: Yes Stairs assistance: Contact guard assist Stair Management: No rails, Step to pattern, Forwards Number of Stairs: 1 General stair comments: up and over curb step to simulate single step entry to home with RW support, cues for sequencing with pt able to recall, CGA for safety   Wheelchair Mobility     Tilt Bed    Modified Rankin (Stroke Patients Only)       Balance Overall balance assessment: Needs assistance Sitting-balance support: Feet supported Sitting balance-Leahy Scale: Good     Standing balance support: During functional activity, No upper extremity supported Standing balance-Leahy Scale: Fair Standing balance comment: benefits from UE support during dynamic activities                            Communication Communication Communication: No apparent difficulties Factors Affecting Communication: Hearing impaired  Cognition Arousal: Alert Behavior During Therapy: [facility] for tasks  assessed/performed   PT - Cognitive impairments: No apparent impairments                         Following commands: Intact      Cueing Cueing Techniques: Verbal cues  Exercises Total Joint Exercises Ankle Circles/Pumps:  AROM, Both, 5 reps, Supine Quad Sets: AROM, Left, 5 reps, Supine Heel Slides: AROM, Left, Seated, 10 reps Straight Leg Raises: AAROM, Left, Supine, 5 reps    General Comments General comments (skin integrity, edema, etc.): reviewed car transfers, assist for leg with bed mobility, assist for certain HEP exercises, using ice machine      Pertinent Vitals/Pain Pain Assessment Pain Assessment: Faces Faces Pain Scale: Hurts little more Pain Location: L knee Pain Descriptors / Indicators: Sharp, Throbbing, Aching Pain Intervention(s): Limited activity within patient's tolerance, Monitored during session    Home Living                          Prior Function            PT Goals (current goals can now be found in the care plan section) Acute Rehab PT Goals Patient Stated Goal: return to work soon PT Goal Formulation: With patient Time For Goal Achievement: 08/04/23 Progress towards PT goals: Progressing toward goals    Frequency    7X/week      PT Plan      Co-evaluation              AM-PAC PT "6 Clicks" Mobility   Outcome Measure  Help needed turning from your back to your side while in a flat bed without using bedrails?: None Help needed moving from lying on your back to sitting on the side of a flat bed without using bedrails?: A Little Help needed moving to and from a bed to a chair (including a wheelchair)?: A Little Help needed standing up from a chair using your arms (e.g., wheelchair or bedside chair)?: A Little Help needed to walk in hospital room?: A Little Help needed climbing 3-5 steps with a railing? : A Little 6 Click Score: 19    End of Session Equipment Utilized During Treatment: Gait belt Activity Tolerance: Patient tolerated treatment well Patient left: in bed;with call bell/phone within reach Nurse Communication: Mobility status;Other (comment) (pt without need for PM session) PT Visit Diagnosis: Difficulty in walking, not  elsewhere classified (R26.2);Pain Pain - Right/Left: Left Pain - part of body: Knee     Time: 1610-9604 PT Time Calculation (min) (ACUTE ONLY): 26 min  Charges:    $Gait Training: 23-37 mins PT General Charges $$ ACUTE PT VISIT: 1 Visit                     Jasminne Mealy R. PTA Acute Rehabilitation Services Office: [phone]   Agapito Horseman 07/30/2023, 9:53 AM

## 2023-08-01 ENCOUNTER — Telehealth: Payer: Self-pay | Admitting: Orthopedic Surgery

## 2023-08-02 ENCOUNTER — Encounter: Payer: Self-pay | Admitting: Orthopedic Surgery

## 2023-08-02 ENCOUNTER — Encounter (HOSPITAL_COMMUNITY): Payer: Self-pay

## 2023-08-04 ENCOUNTER — Other Ambulatory Visit: Payer: Self-pay | Admitting: Surgical

## 2023-08-04 MED ORDER — HYDROMORPHONE HCL 2 MG PO TABS: 3.0000 mg | ORAL_TABLET | ORAL | 0 refills | Status: DC | PRN

## 2023-08-04 MED ORDER — DOCUSATE SODIUM 100 MG PO CAPS: 100.0000 mg | ORAL_CAPSULE | Freq: Two times a day (BID) | ORAL | 0 refills | Status: DC

## 2023-08-04 MED ORDER — METHOCARBAMOL 500 MG PO TABS: 500.0000 mg | ORAL_TABLET | Freq: Four times a day (QID) | ORAL | 0 refills | Status: DC | PRN

## 2023-08-04 NOTE — Telephone Encounter (Signed)
 Hey guys, Erica Fox is a young patient following knee replacement that needs home health physical therapy as soon as possible.  Sounds like the agency she was set up with does not service [place].  Do know of any places/agencies that would be good for her to come to her house hopefully starting this weekend?

## 2023-08-04 NOTE — Discharge Summary (Signed)
 Physician Discharge Summary      Patient ID: Erica Fox MRN: [id number] DOB/AGE: 06-28-1985 38 y.o.  Admit date: 07/28/2023 Discharge date: 07/30/2023  Admission Diagnoses:  Principal Problem:   S/P total knee replacement, left   Discharge Diagnoses:  Same  Surgeries: Procedure(s): ARTHROPLASTY, KNEE, TOTAL on 07/28/2023   Consultants:   Discharged Condition: Stable  Hospital Course: Erica Fox is an 38 y.o. female who was admitted 07/28/2023 with a chief complaint of left knee pain, and found to have a diagnosis of left knee arthritis.  They were brought to the operating room on 07/28/2023 and underwent the above named procedures.  Pt awoke from anesthesia without complication and was transferred to the floor. On POD1, patient's pain was moderate to severe at times but overall controlled.  Mobility improved by POD 2 and pain was better controlled to the point where patient was ready for discharge home.  No red flag signs or symptoms throughout their stay..  Pt will f/u with Dr. Rozelle Corning in clinic in ~2 weeks.   Antibiotics given:  Anti-infectives (From admission, onward)    Start     Dose/Rate Route Frequency Ordered Stop   07/28/23 1700  ceFAZolin  (ANCEF ) IVPB 2g/100 mL premix        2 g 200 mL/hr over 30 Minutes Intravenous Every 8 hours 07/28/23 1420 07/29/23 0206   07/28/23 1000  vancomycin  (VANCOCIN ) powder  Status:  Discontinued          As needed 07/28/23 1000 07/28/23 1241   07/28/23 0755  ceFAZolin  (ANCEF ) IVPB 3g/150 mL premix        3 g 300 mL/hr over 30 Minutes Intravenous On call to O.R. 07/28/23 8295 07/28/23 6213     .  Recent vital signs:  Vitals:   07/30/23 0425 07/30/23 0852  BP: 113/66 134/87  Pulse: 83 87  Resp: 16 17  Temp: (!) 97.5 F (36.4 C) 98.1 F (36.7 C)  SpO2: 99% 100%    Recent laboratory studies:  Results for orders placed or performed during the hospital encounter of 07/28/23  Pregnancy, urine POC   Collection Time: 07/28/23  7:56 AM   Result Value Ref Range   Preg Test, Ur NEGATIVE NEGATIVE    Discharge Medications:   Allergies as of 07/30/2023       Reactions   Penicillins Other (See Comments)   Adopted mother informed her of allergy  but unaware of reaction.        Medication List     STOP taking these medications    levonorgestrel  20 MCG/DAY Iud Commonly known as: MIRENA    Rasuvo  25 MG/0.5ML Soaj Generic drug: Methotrexate  (PF)   Rinvoq  15 MG Tb24 Generic drug: Upadacitinib  ER   Zepbound  2.5 MG/0.5ML Pen Generic drug: tirzepatide        TAKE these medications    aspirin  81 MG chewable tablet Chew 1 tablet (81 mg total) by mouth 2 (two) times daily.   celecoxib  100 MG capsule Commonly known as: CELEBREX  Take 1 capsule (100 mg total) by mouth 2 (two) times daily.   docusate sodium  100 MG capsule Commonly known as: COLACE Take 1 capsule (100 mg total) by mouth 2 (two) times daily.   DULoxetine  60 MG capsule Commonly known as: Cymbalta  Take 1 capsule (60 mg total) by mouth daily.   folic acid  1 MG tablet Commonly known as: FOLVITE  Take 2 tablets (2 mg total) by mouth daily.   HYDROmorphone  2 MG tablet Commonly known as: Dilaudid  Take 1.5 tablets (  3 mg total) by mouth every 4 (four) hours as needed for up to 5 days for severe pain (pain score 7-10).   methocarbamol  500 MG tablet Commonly known as: ROBAXIN  Take 1 tablet (500 mg total) by mouth every 6 (six) hours as needed for muscle spasms.   predniSONE  5 MG tablet Commonly known as: DELTASONE  TAKE 1 TABLET BY MOUTH EVERY DAY WITH BREAKFAST What changed: See the new instructions.   VITAMIN B COMPLEX PO Take 1 capsule by mouth in the morning.   VITAMIN D -3 PO Take 1,000 Units by mouth in the morning.        Diagnostic Studies: No results found.  Disposition: Discharge disposition: 01-Home or Self Care       Discharge Instructions     Call MD / Call 911   Complete by: As directed    If you experience chest pain  or shortness of breath, CALL 911 and be transported to the hospital emergency room.  If you develope a fever above 101 F, pus (white drainage) or increased drainage or redness at the wound, or calf pain, call your surgeon's office.   Constipation Prevention   Complete by: As directed    Drink plenty of fluids.  Prune juice may be helpful.  You may use a stool softener, such as Colace (over the counter) 100 mg twice a day.  Use MiraLax (over the counter) for constipation as needed.   Diet - low sodium heart healthy   Complete by: As directed    Discharge instructions   Complete by: As directed    From Dr. Rozelle Corning: 1.  CPM machine 1 hour 3 times a day increasing the degrees daily 2.  Okay to shower dressing is waterproof 3.  Weightbearing as tolerated with walker 4.  Let us  know next week if the pain regimen is sufficient   Increase activity slowly as tolerated   Complete by: As directed    Post-operative opioid taper instructions:   Complete by: As directed    POST-OPERATIVE OPIOID TAPER INSTRUCTIONS: It is important to wean off of your opioid medication as soon as possible. If you do not need pain medication after your surgery it is ok to stop day one. Opioids include: Codeine, Hydrocodone(Norco, Vicodin), Oxycodone (Percocet, oxycontin ) and hydromorphone  amongst others.  Long term and even short term use of opiods can cause: Increased pain response Dependence Constipation Depression Respiratory depression And more.  Withdrawal symptoms can include Flu like symptoms Nausea, vomiting And more Techniques to manage these symptoms Hydrate well Eat regular healthy meals Stay active Use relaxation techniques(deep breathing, meditating, yoga) Do Not substitute Alcohol to help with tapering If you have been on opioids for less than two weeks and do not have pain than it is ok to stop all together.  Plan to wean off of opioids This plan should start within one week post op of your joint  replacement. Maintain the same interval or time between taking each dose and first decrease the dose.  Cut the total daily intake of opioids by one tablet each day Next start to increase the time between doses. The last dose that should be eliminated is the evening dose.           Follow-up Information     [facility] Follow up.   Specialty: Home Health Services Why: Physical therapy. office will contact you to follow up after hospital discharge. Contact information: [address] [place] [address] [phone]  SignedPrentis Brock 08/04/2023, 1:08 PM

## 2023-08-05 ENCOUNTER — Other Ambulatory Visit: Payer: Self-pay | Admitting: Surgical

## 2023-08-05 MED ORDER — HYDROMORPHONE HCL 2 MG PO TABS: 3.0000 mg | ORAL_TABLET | ORAL | 0 refills | Status: AC | PRN

## 2023-08-05 MED ORDER — METHOCARBAMOL 500 MG PO TABS: 500.0000 mg | ORAL_TABLET | Freq: Four times a day (QID) | ORAL | 0 refills | Status: DC | PRN

## 2023-08-08 ENCOUNTER — Encounter: Payer: Self-pay | Admitting: Radiology

## 2023-08-09 ENCOUNTER — Encounter: Payer: Self-pay | Admitting: Orthopedic Surgery

## 2023-08-09 NOTE — Telephone Encounter (Signed)
 At this point I'd say outpatient is best option. Probably Erica Fox is best or maybe she can do it at [place] with Polly Brink since she is in [place]. [facility] probably booked out too far

## 2023-08-10 ENCOUNTER — Other Ambulatory Visit (INDEPENDENT_AMBULATORY_CARE_PROVIDER_SITE_OTHER)

## 2023-08-10 ENCOUNTER — Ambulatory Visit (INDEPENDENT_AMBULATORY_CARE_PROVIDER_SITE_OTHER): Admitting: Orthopedic Surgery

## 2023-08-10 ENCOUNTER — Encounter: Payer: Self-pay | Admitting: Orthopedic Surgery

## 2023-08-10 DIAGNOSIS — Z96652 Presence of left artificial knee joint: Secondary | ICD-10-CM

## 2023-08-10 MED ORDER — OXYCODONE-ACETAMINOPHEN 10-325 MG PO TABS: 1.0000 | ORAL_TABLET | Freq: Four times a day (QID) | ORAL | 0 refills | Status: DC | PRN

## 2023-08-10 MED ORDER — HYDROXYZINE HCL 10 MG PO TABS: 10.0000 mg | ORAL_TABLET | Freq: Three times a day (TID) | ORAL | 0 refills | Status: DC | PRN

## 2023-08-10 NOTE — Progress Notes (Signed)
 Post-Op Visit Note   Patient: Erica Fox           Date of Birth: 11-30-1985           MRN: [id number] Visit Date: 08/10/2023 PCP: Abram Abraham, NP-C   Assessment & Plan:  Chief Complaint:  Chief Complaint  Patient presents with   Left Knee - Routine Post Op    07/28/23 left TKA   Visit Diagnoses:  1. S/P total knee arthroplasty, left     Plan: Jerryl Morin is a patient is now 2 weeks out left total knee replacement.  Doing some range of motion exercises at home.  Has a CPM and I do not think he has been using it too much because of some itching she has had associated with her pain medicine.  She is currently off Celebrex .  She was taking Dilaudid  has some itching stop the itching got better but then she started taking it again and had some recurrent itching.  On exam the incisions intact.  Has flexion to about 60.  Hard for her to do a straight leg raise yet.  I do think she needs to really work on CPM machine about 3 to 4 hours a day minimum.  Organ to try Atarax for the itching and change her from Dilaudid  to oxycodone .  Start physical therapy for range of motion and strengthening and healing.  2-week return as needed for clinical recheck.  Would like for her to be close to 90 degrees at that time.  No calf tenderness negative Homans today.  Follow-Up Instructions: No follow-ups on file.   Orders:  Orders Placed This Encounter  Procedures   XR Knee 1-2 Views Left   No orders of the defined types were placed in this encounter.   Imaging: No results found.  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    left eye, dx by opthalmology   Degenerative joint disease    OCD (obsessive compulsive disorder) 2025   per patient   PTSD (post-traumatic stress disorder) 2025   per patient   Rheumatoid arthritis (HCC)     Family History  Problem Relation Age of Onset   Heart disease Mother    Breast cancer Paternal Aunt    Healthy Son    Healthy Son     Past Surgical History:  Procedure Laterality Date   CESAREAN SECTION     2007, 2012   EYE SURGERY Right    age 20, tighten muscle for lazy eye   FOOT SURGERY Left    hammertoe correction & screws placed (2 surgeries on left foot)   FOOT SURGERY Right 05/07/2022   hammertoe correction   TOTAL KNEE ARTHROPLASTY Left 07/28/2023   Procedure: ARTHROPLASTY, KNEE, TOTAL;  Surgeon: Jasmine Mesi, MD;  Location: [facility] OR;  Service: Orthopedics;  Laterality: Left;   TUBAL LIGATION     WISDOM TOOTH EXTRACTION  2014   Social History   Occupational History   Not on file  Tobacco Use   Smoking status: Never    Passive exposure: Past   Smokeless tobacco: Never  Vaping Use  Vaping status: Never Used  Substance and Sexual Activity   Alcohol use: Yes    Comment: maybe a drink once every 2 months   Drug use: Not Currently   Sexual activity: Yes    Partners: Male    Birth control/protection: I.U.D., Surgical    Comment: tubal, Mirena , menarche 38yo, sexual debut 38yo

## 2023-08-11 ENCOUNTER — Encounter: Payer: Self-pay | Admitting: Orthopedic Surgery

## 2023-08-11 ENCOUNTER — Telehealth: Payer: Self-pay | Admitting: Physical Therapy

## 2023-08-12 NOTE — Telephone Encounter (Signed)
 Called.

## 2023-08-16 ENCOUNTER — Ambulatory Visit: Admitting: Family Medicine

## 2023-08-17 ENCOUNTER — Telehealth: Payer: Self-pay | Admitting: Radiology

## 2023-08-17 ENCOUNTER — Encounter: Attending: Physical Medicine & Rehabilitation | Admitting: Physical Medicine & Rehabilitation

## 2023-08-17 ENCOUNTER — Encounter: Payer: Self-pay | Admitting: Physical Medicine & Rehabilitation

## 2023-08-17 VITALS — BP 117/75 | HR 88 | Ht 68.0 in | Wt 272.0 lb

## 2023-08-17 DIAGNOSIS — G894 Chronic pain syndrome: Secondary | ICD-10-CM | POA: Diagnosis present

## 2023-08-17 DIAGNOSIS — M0609 Rheumatoid arthritis without rheumatoid factor, multiple sites: Secondary | ICD-10-CM | POA: Insufficient documentation

## 2023-08-17 MED ORDER — TRAMADOL HCL 50 MG PO TABS: 25.0000 mg | ORAL_TABLET | Freq: Three times a day (TID) | ORAL | 1 refills | Status: DC | PRN

## 2023-08-17 NOTE — Progress Notes (Signed)
 Subjective:    Patient ID: Erica Fox, female    DOB: 1985-08-09, 38 y.o.   MRN: [id number]  HPI  This is a 38 year old female with a history of rheumatoid arthritis who complains of diffuse joint pain particularly in her hands and wrist as well as her right knee ankles, neck,  and feet. She's had surgeries on her toes and her feet (foot surgery was just on left foot)  She rates her pain an 8 out of 10 on average and describes it as constant stabbing and aching.  And interferes with general activities and enjoyment of life on a severe level.  Pain is worse at the morning and impacts her sleep negatively.  Pain increases with walking bending standing and sometimes when she sits for too long.  Rest does help her pain.  She states she can walk about a minute without having to stop.  She has seen Dr. Zandra Hew for her bilateral knee pain.  On 07/28/2023 she had left total knee arthroplasty and is already walking without a device.  For pain management she has been on Cymbalta  60mg  chronically as well as Percocet 10/325 1 every 6 hours as needed recently for her knee surgery.  She took Celebrex  post-op which caused a rash/pruritus.  She also takes for her rheumatoid arthritis and is on biologics per Dr. Jeoffrey Mole and  PA Lucila Rye.at [facility].   Pain is throbbing and aching. She denies any numbness and tingling.   Percocet has helped with her pain but it makes her "chill" out and sleepy.   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 She returned to work yesterday.  She can stand for about 5 minutes before she becomes uncomfortable. Stairs are impossible. She lives in a one level home.   She starts PT tomorrow. She has a CPM which has ranged her to 70 degrees.  She is receiving a treadmill at the house today which she intends to walk on.  She does have a foot bike as well.   Pain Inventory Average Pain 8 Pain Right Now 10 My pain is constant, stabbing, and  aching  In the last 24 hours, has pain interfered with the following? General activity 10 Relation with others 10 Enjoyment of life 10 What TIME of day is your pain at its worst? morning  and night Sleep (in general) Poor  Pain is worse with: walking, bending, inactivity, and standing Pain improves with: rest Relief from Meds: 0  walk without assistance walk with assistance how many minutes can you walk? 1 ability to climb steps?  no do you drive?  yes  employed # of hrs/week Personnel officer I need assistance with the following:  dressing and bathing Do you have any goals in this area?  yes  trouble walking anxiety  Any changes since last visit?  yes x-rays CT/MRI in the  System  Any changes since last visit?  no    Family History  Problem Relation Age of Onset   Heart disease Mother    Breast cancer Paternal Aunt    Healthy Son    Healthy Son    Social History   Socioeconomic History   Marital status: Married    Spouse name: Not on file   Number of children: 2   Years of education: Not on file   Highest education level: Associate degree: academic program  Occupational History   Not on file  Tobacco Use  Smoking status: Never    Passive exposure: Past   Smokeless tobacco: Never  Vaping Use   Vaping status: Never Used  Substance and Sexual Activity   Alcohol use: Yes    Comment: maybe a drink once every 2 months   Drug use: Not Currently   Sexual activity: Yes    Partners: Male    Birth control/protection: I.U.D., Surgical    Comment: tubal, Mirena , menarche 38yo, sexual debut 38yo  Other Topics Concern   Not on file  Social History Narrative   Not on file   Social Drivers of Health   Financial Resource Strain: Low Risk  (07/03/2023)   Overall Financial Resource Strain (CARDIA)    Difficulty of Paying Living Expenses: Not very hard  Food Insecurity: No Food Insecurity (07/28/2023)   Hunger Vital Sign    Worried About Running Out  of Food in the Last Year: Never true    Ran Out of Food in the Last Year: Never true  Transportation Needs: No Transportation Needs (07/28/2023)   PRAPARE - Administrator, Civil Service (Medical): No    Lack of Transportation (Non-Medical): No  Physical Activity: Unknown (07/03/2023)   Exercise Vital Sign    Days of Exercise per Week: 0 days    Minutes of Exercise per Session: Not on file  Stress: Stress Concern Present (07/03/2023)   [employer] of Occupational Health - Occupational Stress Questionnaire    Feeling of Stress : To some extent  Social Connections: Socially Isolated (07/03/2023)   Social Connection and Isolation Panel [NHANES]    Frequency of Communication with Friends and Family: Never    Frequency of Social Gatherings with Friends and Family: Never    Attends Religious Services: Never    Database administrator or Organizations: No    Attends Engineer, structural: Not on file    Marital Status: Married   Past Surgical History:  Procedure Laterality Date   CESAREAN SECTION     2007, 2012   EYE SURGERY Right    age 48, tighten muscle for lazy eye   FOOT SURGERY Left    hammertoe correction & screws placed (2 surgeries on left foot)   FOOT SURGERY Right 05/07/2022   hammertoe correction   TOTAL KNEE ARTHROPLASTY Left 07/28/2023   Procedure: ARTHROPLASTY, KNEE, TOTAL;  Surgeon: Jasmine Mesi, MD;  Location: [facility] OR;  Service: Orthopedics;  Laterality: Left;   TUBAL LIGATION     WISDOM TOOTH EXTRACTION  2014   Past Medical History:  Diagnosis Date   ADHD (attention deficit hyperactivity disorder) 2025   per patient   Anemia    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   left eye, dx by opthalmology   Degenerative joint disease    OCD (obsessive compulsive disorder) 2025   per patient   PTSD (post-traumatic stress disorder) 2025   per patient   Rheumatoid arthritis (HCC)    LMP 03/22/2022 (Approximate) Comment: no period in about a  year and a half due to IUD  Opioid Risk Score:   Fall Risk Score:  `1  Depression screen [facility] 2/9     07/05/2023   10:45 AM 02/02/2023   11:10 AM 03/11/2022    2:53 PM  Depression screen PHQ 2/9  Decreased Interest 0 0 1  Down, Depressed, Hopeless 0 0 1  PHQ - 2 Score 0 0 2  Altered sleeping   0  Tired, decreased energy   2  Change in appetite  3  Feeling bad or failure about yourself    1  Trouble concentrating   2  Moving slowly or fidgety/restless   3  Suicidal thoughts   0  PHQ-9 Score   13    Review of Systems  Constitutional:        Weight loss  Cardiovascular:        Right knee & both wrist  Musculoskeletal:  Positive for gait problem.  Psychiatric/Behavioral:         Anxiety  All other systems reviewed and are negative.     Objective:   Physical Exam  Gen: no distress, normal appearing. obese HEENT: oral mucosa pink and moist, NCAT Cardio: Reg rate Chest: normal effort, normal rate of breathing Abd: soft, non-distended Ext: no edema Psych: pleasant, normal affect Skin: intact Neuro: Alert and oriented x 3. Normal insight and awareness. Intact Memory. Normal language and speech. Cranial nerve exam unremarkable. MMT: 5/5 in UE's and LE's with pain limitiatons.   Musculoskeletal: 20 degrees right wrist ROM, 30 degrees on left. 60 flexion left knee. Some limitations in rotation of neck L>R. Fairly functional ankle ROM.  Fairly preserved range of motion at both shoulders and hips.  She has definite antalgia with weightbearing left more than right today.  She walks with a wide-based gait.      Assessment & Plan:  Chronic Polyartricular Rheumatoid Arthritis Recent left TKA--with limited knee flexion currently, only to 60 to 70 degrees of flexion.   Plan: Trial of tramadol  25-50mg  q8 hours as needed Controlled substance agreement if we decide this will be a long term venture Weight loss Low impact exercise if possible.  Pool activities would be ideal.  She  does have a treadmill Consider butrans patch and other options as needed Needs aggressive physical therapy to address knee range of motion  Thirty minutes of face to face patient care time were spent during this visit. All questions were encouraged and answered. Follow up with me in 6 weeks. Aaron Aas

## 2023-08-17 NOTE — Patient Instructions (Signed)
SUPPLEMENTS USEFUL FOR OSTEOARTHRITIS: OMEGA 3 FATTY ACIDS, TURMERIC, GINGER, TART CHERRY EXTRACT, CELERY SEED, GLUCOSAMINE WITH CHONDROITIN   ?

## 2023-08-17 NOTE — Telephone Encounter (Signed)
 Patient no show to her ultrasound appointment and has not rescheduled. Message left on cell phone, MyChart message sent and letter mailed for patient to call and reschedule appointment.

## 2023-08-18 ENCOUNTER — Ambulatory Visit: Attending: Orthopedic Surgery | Admitting: Physical Therapy

## 2023-08-18 ENCOUNTER — Other Ambulatory Visit: Payer: Self-pay | Admitting: Rheumatology

## 2023-08-18 ENCOUNTER — Other Ambulatory Visit: Payer: Self-pay

## 2023-08-18 ENCOUNTER — Encounter: Payer: Self-pay | Admitting: Physical Therapy

## 2023-08-18 DIAGNOSIS — M25662 Stiffness of left knee, not elsewhere classified: Secondary | ICD-10-CM

## 2023-08-18 DIAGNOSIS — M0609 Rheumatoid arthritis without rheumatoid factor, multiple sites: Secondary | ICD-10-CM

## 2023-08-18 DIAGNOSIS — Z79899 Other long term (current) drug therapy: Secondary | ICD-10-CM

## 2023-08-18 DIAGNOSIS — R2689 Other abnormalities of gait and mobility: Secondary | ICD-10-CM

## 2023-08-18 DIAGNOSIS — Z96652 Presence of left artificial knee joint: Secondary | ICD-10-CM | POA: Insufficient documentation

## 2023-08-18 DIAGNOSIS — M25562 Pain in left knee: Secondary | ICD-10-CM

## 2023-08-18 NOTE — Telephone Encounter (Signed)
 Last Fill: 05/17/2023  Labs: 07/19/2023 WNL  Next Visit: 10/12/2023  Last Visit: 07/13/2023  DX:  Rheumatoid arthritis of multiple sites with negative rheumatoid factor   Current Dose per office note 07/13/2023: Rasuvo  25 mg sq injections once weekly,   Okay to refill Rasuvo ?

## 2023-08-18 NOTE — Therapy (Signed)
 OUTPATIENT PHYSICAL THERAPY LOWER EXTREMITY EVALUATION   Patient Name: Erica Fox MRN: [id number] DOB:[birth year], 38 y.o., female Today's Date: 08/18/2023  END OF SESSION:  PT End of Session - 08/18/23 1649     Visit Number 1    Number of Visits 17    Date for PT Re-Evaluation 10/13/23    PT Start Time 1640    PT Stop Time 1725    PT Time Calculation (min) 45 min    Activity Tolerance Patient tolerated treatment well    Behavior During Therapy [facility] for tasks assessed/performed             Past Medical History:  Diagnosis Date   ADHD (attention deficit hyperactivity disorder) 2025   per patient   Anemia    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   left eye, dx by opthalmology   Degenerative joint disease    OCD (obsessive compulsive disorder) 2025   per patient   PTSD (post-traumatic stress disorder) 2025   per patient   Rheumatoid arthritis [facility])    Past Surgical History:  Procedure Laterality Date   CESAREAN SECTION     2007, 2012   EYE SURGERY Right    age 32, tighten muscle for lazy eye   FOOT SURGERY Left    hammertoe correction & screws placed (2 surgeries on left foot)   FOOT SURGERY Right 05/07/2022   hammertoe correction   TOTAL KNEE ARTHROPLASTY Left 07/28/2023   Procedure: ARTHROPLASTY, KNEE, TOTAL;  Surgeon: Jasmine Mesi, MD;  Location: [facility] OR;  Service: Orthopedics;  Laterality: Left;   TUBAL LIGATION     WISDOM TOOTH EXTRACTION  2014   Patient Active Problem List   Diagnosis Date Noted   Chronic pain syndrome 08/17/2023   S/P total knee replacement, left 07/28/2023   Rosacea 02/02/2023   Seasonal allergies 08/26/2022   Rheumatoid arthritis of multiple sites with negative rheumatoid factor (HCC) 07/22/2022    PCP: Abram Abraham, NP-C  REFERRING PROVIDER: Jasmine Mesi, MD  REFERRING DIAG: S/P total knee arthroplasty, left [Z96.652]   Rationale for Evaluation and Treatment: Rehabilitation  THERAPY DIAG:  Acute pain of  left knee - Plan: PT plan of care cert/re-cert  Stiffness of left knee, not elsewhere classified - Plan: PT plan of care cert/re-cert  Other abnormalities of gait and mobility - Plan: PT plan of care cert/re-cert  PERTINENT HISTORY: RA, chronic pain syndrome, L TKA 07/28/2023  WEIGHT BEARING RESTRICTIONS: Yes WBAT  FALLS:  Has patient fallen in last 6 months? No  LIVING ENVIRONMENT: Lives with: lives with their spouse Lives in: House/apartment Stairs: No Has following equipment at home: Single point cane and Environmental consultant - 2 wheeled  OCCUPATION: property management   PRECAUTIONS: None ---------------------------------------------------------------------------------------------  SUBJECTIVE:   SUBJECTIVE STATEMENT: Eval statement 08/18/2023: was dx with juvenile rheumatoid arthritis. L TKA on 07/28/2023. Struggles with stairs, SLR, and prolonged ambulation. Currently 6/10 pain  RED FLAGS: None   PLOF: Independent  PATIENT GOALS: fix the knee.  NEXT MD VISIT: next week ---------------------------------------------------------------------------------------------  OBJECTIVE:  Note: Objective measures were completed at Evaluation unless otherwise noted.  DIAGNOSTIC FINDINGS:  IMPRESSION: Degenerative arthrosis predominantly medial compartmental chondromalacia. There is slight blunting of the medial meniscus without discrete meniscal tear. Small reactive joint effusion.   No erosive changes or evidence of synovitis.   Electronically signed by: Adrien Alberta MD 07/14/2023 02:24 PM EDT RP Workstation: [id number]    AP lateral merchant radiographs left knee reviewed.  No acute fracture.  Medial and lateral joint spaces reasonably well-maintained.  Moderate  patellofemoral arthritis.  Alignment intact.   PATIENT SURVEYS:  LEFS :0/80  COGNITION: Overall cognitive status: Within functional limits for tasks assessed     SENSATION: WFL  EDEMA:  Moderate-severe swelling  around L knee  POSTURE: No Significant postural limitations  PALPATION: TTP around surgical site  LOWER EXTREMITY ROM:  Active ROM Right eval Left eval  Hip flexion [facility]   Hip extension    Hip abduction    Hip adduction    Hip internal rotation    Hip external rotation    Knee flexion  50  Knee extension  -2  Ankle dorsiflexion    Ankle plantarflexion    Ankle inversion    Ankle eversion     (Blank rows = not tested)  ! Indicates pain with testing  LOWER EXTREMITY MMT: deferred d/t recent surgery  MMT Right eval Left eval  Hip flexion    Hip extension    Hip abduction    Hip adduction    Hip internal rotation    Hip external rotation    Knee flexion    Knee extension    Ankle dorsiflexion    Ankle plantarflexion    Ankle inversion    Ankle eversion     (Blank rows = not tested)  ! Indicates pain with testing  LOWER EXTREMITY SPECIAL TESTS:  deferred d/t recent surgery   GAIT: Distance walked: 276ft Assistive device utilized: None Level of assistance: Complete Independence Comments: pt displays appropriate knee extension during stance phase of LLE                                                                                                                                [facility] Adult PT Treatment:                                                DATE: 08/18/2023 Self Care: Pt education, detailed below POC discussion    PATIENT EDUCATION:  Education details: Pt received education regarding HEP performance, ADL performance, functional activity tolerance, impairment education, appropriate performance of therapeutic activities. Swelling management Person educated: Patient Education method: Explanation, Demonstration, Tactile cues, Verbal cues, and Handouts Education comprehension: verbalized understanding and returned demonstration  HOME EXERCISE PROGRAM: Access Code: [email or web address] URL: [email or web address] Date: 08/18/2023 Prepared by:  Albesa Huguenin  Exercises - Seated Knee Flexion AAROM  - 3 x daily - 7 x weekly - 2-3 sets - 1 reps - 13m hold - Active Straight Leg Raise with Quad Set  - 1 x daily - 7 x weekly - 3 sets - 10 reps - Supine Quad Set  - 1-3 x daily - 7 x weekly - 2-3 sets - 10 reps - 10s hold - Supine Isometric Hamstring Set  -  1 x daily - 7 x weekly - 2-3 sets - 10 reps - 10s hold ---------------------------------------------------------------------------------------------  ASSESSMENT:  CLINICAL IMPRESSION: Eval impression (08/18/2023): Pt. attended today's physical therapy session for evaluation of L TKA. Pt has complaints of 6/10 pain with difficulty with stairs, SLR, and prolonged ambulation/activity. Pt has notable deficits with quadriceps recruitment, L knee A/PROM and swelling. Pt would benefit from therapeutic focus on quad recruitment, hip/knee stability, A/PROM, and swelling management.  Treatment performed today focused on pt education detailed in obj. Pt demonstrated great understanding of education provided. required minimal verbal/tactile cues and no physical assistance for appropriate performance with today's activities. Pt requires the intervention of skilled outpatient physical therapy to address the aforementioned deficits and progress towards a functional level in line with therapeutic goals.   OBJECTIVE IMPAIRMENTS: Abnormal gait, decreased activity tolerance, decreased knowledge of use of DME, decreased mobility, difficulty walking, decreased ROM, decreased strength, hypomobility, increased edema, improper body mechanics, and pain.   ACTIVITY LIMITATIONS: sitting, standing, squatting, stairs, transfers, and locomotion level  PARTICIPATION LIMITATIONS: cleaning, laundry, driving, community activity, and occupation  PERSONAL FACTORS: Fitness, Profession, Time since onset of injury/illness/exacerbation, and 1 comorbidity: JRA are also affecting patient's functional outcome.   REHAB POTENTIAL:  Good  CLINICAL DECISION MAKING: Stable/uncomplicated  EVALUATION COMPLEXITY: Low   GOALS: Goals reviewed with patient? YES  SHORT TERM GOALS: Target date: 09/15/2023 Pt will be independent with administered HEP to demonstrate the competency necessary for long term managemnet of symptoms at home.  Baseline: Goal status: INITIAL   LONG TERM GOALS: Target date: 10/13/2023   Pt. Will achieve a LEFS score of 60/80 as to demonstrate improvement in self-perceived functional ability with daily activities.  Baseline: 0 Goal status: INITIAL  2.  Pt will improve Global Hip/knee strength to a 4+/5 to demonstrate improvement in strength for quality of motion and activity performance. Baseline:  Goal status: INITIAL  3.  Pt will report pain levels improving during ADLs to be less than or equal to 2/10 as to demonstrate improved tolerance with daily functional activities such as work and cleaning.  Baseline:  Goal status: INITIAL  4.  Pt will report the ability to stand for >/= 90 minutes as to demonstrate improved tolerance to standing for prolonged time and improved ability to participate in work.  Baseline:  Goal status: INITIAL  5.   Pt will independently ambulate 104ft with no AD and less than 2/10 pain to demonstrate improved weightbearing tolerance, BLE strength, and functional capacity for community ambulation.  Baseline:  Goal status: INITIAL --------------------------------------------------------------------------------------------- PLAN:  PT FREQUENCY: 2x/week  PT DURATION: 8 weeks  PLANNED INTERVENTIONS: 97110-Therapeutic exercises, 97530- Therapeutic activity, [id number]- Neuromuscular re-education, 97535- Self Care, [id number]- Manual therapy, [id number]- Gait training, [phone]- Electrical stimulation (manual), 97016- Vasopneumatic device, 97597- Wound care (first 20 sq cm), Patient/Family education, Balance training, Stair training, Taping, Dry Needling, Joint mobilization, Scar  mobilization, Compression bandaging, DME instructions, and Cryotherapy  PLAN FOR NEXT SESSION: Review HEP, Begin POC as detailed in the assessment   Albesa Huguenin, PT, DPT 08/18/2023, 5:41 PM

## 2023-08-22 ENCOUNTER — Encounter: Payer: Self-pay | Admitting: Physical Therapy

## 2023-08-22 ENCOUNTER — Ambulatory Visit: Attending: Orthopedic Surgery | Admitting: Physical Therapy

## 2023-08-22 DIAGNOSIS — M25662 Stiffness of left knee, not elsewhere classified: Secondary | ICD-10-CM | POA: Insufficient documentation

## 2023-08-22 DIAGNOSIS — M25562 Pain in left knee: Secondary | ICD-10-CM | POA: Diagnosis present

## 2023-08-22 DIAGNOSIS — R2689 Other abnormalities of gait and mobility: Secondary | ICD-10-CM | POA: Insufficient documentation

## 2023-08-22 NOTE — Therapy (Unsigned)
 OUTPATIENT PHYSICAL THERAPY TREATMENT  Patient Name: Erica Fox MRN: [id number] DOB:1985/08/30, 38 y.o., female Today's Date: 08/24/2023  END OF SESSION:  PT End of Session - 08/24/23 0921     Visit Number 3    Number of Visits 17    Date for PT Re-Evaluation 10/13/23    Authorization Type [employer]    Authorization - Visit Number 3    Authorization - Number of Visits 60    PT Start Time 0920    PT Stop Time 1000    PT Time Calculation (min) 40 min    Activity Tolerance Patient limited by pain    Behavior During Therapy [facility] for tasks assessed/performed               Past Medical History:  Diagnosis Date   ADHD (attention deficit hyperactivity disorder) 2025   per patient   Anemia    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   left eye, dx by opthalmology   Degenerative joint disease    OCD (obsessive compulsive disorder) 2025   per patient   PTSD (post-traumatic stress disorder) 2025   per patient   Rheumatoid arthritis [facility])    Past Surgical History:  Procedure Laterality Date   CESAREAN SECTION     2007, 2012   EYE SURGERY Right    age 28, tighten muscle for lazy eye   FOOT SURGERY Left    hammertoe correction & screws placed (2 surgeries on left foot)   FOOT SURGERY Right 05/07/2022   hammertoe correction   TOTAL KNEE ARTHROPLASTY Left 07/28/2023   Procedure: ARTHROPLASTY, KNEE, TOTAL;  Surgeon: Jasmine Mesi, MD;  Location: [facility] OR;  Service: Orthopedics;  Laterality: Left;   TUBAL LIGATION     WISDOM TOOTH EXTRACTION  2014   Patient Active Problem List   Diagnosis Date Noted   Chronic pain syndrome 08/17/2023   S/P total knee replacement, left 07/28/2023   Rosacea 02/02/2023   Seasonal allergies 08/26/2022   Rheumatoid arthritis of multiple sites with negative rheumatoid factor (HCC) 07/22/2022    PCP: Abram Abraham, NP-C  REFERRING PROVIDER: Jasmine Mesi, MD  REFERRING DIAG: S/P total knee arthroplasty, left [X91.478]   Rationale  for Evaluation and Treatment: Rehabilitation  THERAPY DIAG:  Acute pain of left knee  Stiffness of left knee, not elsewhere classified  Other abnormalities of gait and mobility  PERTINENT HISTORY: RA, chronic pain syndrome, L TKA 07/28/2023  WEIGHT BEARING RESTRICTIONS: Yes WBAT  FALLS:  Has patient fallen in last 6 months? No  LIVING ENVIRONMENT: Lives with: lives with their spouse Lives in: House/apartment Stairs: No Has following equipment at home: Single point cane and Environmental consultant - 2 wheeled  OCCUPATION: property management   PRECAUTIONS: None ---------------------------------------------------------------------------------------------  SUBJECTIVE:   SUBJECTIVE STATEMENT: Minimal pain at start of session.  Pain can spike to 10/10.     Eval statement 08/18/2023: was dx with juvenile rheumatoid arthritis. L TKA on 07/28/2023. Struggles with stairs, SLR, and prolonged ambulation. Currently 6/10 pain  RED FLAGS: None   PLOF: Independent  PATIENT GOALS: fix the knee.  NEXT MD VISIT: June 6th ---------------------------------------------------------------------------------------------  OBJECTIVE:  Note: Objective measures were completed at Evaluation unless otherwise noted.  DIAGNOSTIC FINDINGS:  IMPRESSION: Degenerative arthrosis predominantly medial compartmental chondromalacia. There is slight blunting of the medial meniscus without discrete meniscal tear. Small reactive joint effusion.   No erosive changes or evidence of synovitis.   Electronically signed by: Adrien Alberta MD 07/14/2023 02:24 PM EDT RP Workstation:  [id number]    AP lateral merchant radiographs left knee reviewed.  No acute fracture.   Medial and lateral joint spaces reasonably well-maintained.  Moderate  patellofemoral arthritis.  Alignment intact.   PATIENT SURVEYS:  LEFS :0/80  COGNITION: Overall cognitive status: Within functional limits for tasks  assessed     SENSATION: WFL  EDEMA:  Moderate-severe swelling around L knee  POSTURE: No Significant postural limitations  PALPATION: TTP around surgical site  LOWER EXTREMITY ROM:  Active ROM Right eval Left eval  Hip flexion [facility]   Hip extension    Hip abduction    Hip adduction    Hip internal rotation    Hip external rotation    Knee flexion  50  Knee extension  -2  Ankle dorsiflexion    Ankle plantarflexion    Ankle inversion    Ankle eversion     (Blank rows = not tested)  ! Indicates pain with testing  LOWER EXTREMITY MMT: deferred d/t recent surgery  MMT Right eval Left eval  Hip flexion    Hip extension    Hip abduction    Hip adduction    Hip internal rotation    Hip external rotation    Knee flexion    Knee extension    Ankle dorsiflexion    Ankle plantarflexion    Ankle inversion    Ankle eversion     (Blank rows = not tested)  ! Indicates pain with testing  LOWER EXTREMITY SPECIAL TESTS:  deferred d/t recent surgery   GAIT: Distance walked: 279ft Assistive device utilized: None Level of assistance: Complete Independence Comments: pt displays appropriate knee extension during stance phase of LLE  TREATMENT:  [facility] Adult PT Treatment:                                                DATE: 08/24/23 Therapeutic Exercise: Nustep L2 seat 11 8 min  Neuromuscular re-ed: Seated heel slides with QS 15x2  FAQs L 15x2 Sidestep with yellow power strap  Therapeutic Activity: Heel slides with strap 15x2 69d flexion L knee flexion over 6 in step 15x Heel raises over 4 in step 15x  ICE over bolster 10 min    [facility] Adult PT Treatment:                                                DATE: 08/22/2023  Therapeutic Exercise: Rec bike, partials 5' PROM into knee flex/ext Heel slide with strap 2x10, hold 4s Neuromuscular re-ed: SLR w/ quad set  2x8, 5s hold up/5s down, strap asssist  [facility] Adult PT Treatment:                                                DATE: 08/18/2023 Self Care: Pt education, detailed below POC discussion    PATIENT EDUCATION:  Education details: Pt received education regarding HEP performance, ADL performance, functional activity tolerance, impairment education, appropriate performance of therapeutic activities. Swelling management Person educated: Patient Education method: Explanation, Demonstration, Tactile cues, Verbal cues, and Handouts Education comprehension: verbalized understanding and returned demonstration  HOME EXERCISE PROGRAM: Access Code: [email or web address] URL: [email or web address] Date: 08/18/2023 Prepared by: Albesa Huguenin  Exercises - Seated Knee Flexion AAROM  - 3 x daily - 7 x weekly - 2-3 sets - 1 reps - 33m hold - Active Straight Leg Raise with Quad Set  - 1 x daily - 7 x weekly - 3 sets - 10 reps - Supine Quad Set  - 1-3 x daily - 7 x weekly - 2-3 sets - 10 reps - 10s hold - Supine Isometric Hamstring Set  - 1 x daily - 7 x weekly - 2-3 sets - 10 reps - 10s hold ---------------------------------------------------------------------------------------------  ASSESSMENT:  CLINICAL IMPRESSION: Focus of today was flexion, demonstrating multiple ways for patient to flex L knee.  Prefers Nustep over recumbent as it allows for more strength.  Able to flex to 70d today but not w/o discomfort.     Eval impression (08/18/2023): Pt. attended today's physical therapy session for evaluation of L TKA. Pt has complaints of 6/10 pain with difficulty with stairs, SLR, and prolonged ambulation/activity. Pt has notable deficits with quadriceps recruitment, L knee A/PROM and swelling. Pt would benefit from therapeutic focus on quad recruitment, hip/knee stability, A/PROM, and swelling management.  Treatment performed today focused on pt education detailed in obj. Pt demonstrated great  understanding of education provided. required minimal verbal/tactile cues and no physical assistance for appropriate performance with today's activities. Pt requires the intervention of skilled outpatient physical therapy to address the aforementioned deficits and progress towards a functional level in line with therapeutic goals.   OBJECTIVE IMPAIRMENTS: Abnormal gait, decreased activity tolerance, decreased knowledge of use of DME, decreased mobility, difficulty walking, decreased ROM, decreased strength, hypomobility, increased edema, improper body mechanics, and pain.   ACTIVITY LIMITATIONS: sitting, standing, squatting, stairs, transfers, and locomotion level  PARTICIPATION LIMITATIONS: cleaning, laundry, driving, community activity, and occupation  PERSONAL FACTORS: Fitness, Profession, Time since onset of injury/illness/exacerbation, and 1 comorbidity: JRA are also affecting patient's functional outcome.   REHAB POTENTIAL: Good  CLINICAL DECISION MAKING: Stable/uncomplicated  EVALUATION COMPLEXITY: Low   GOALS: Goals reviewed with patient? YES  SHORT TERM GOALS: Target date: 09/15/2023 Pt will be independent with administered HEP to demonstrate the competency necessary for long term managemnet of symptoms at home.  Baseline: Goal status: INITIAL   LONG TERM GOALS: Target date: 10/13/2023   Pt. Will achieve a LEFS score of 60/80 as to demonstrate improvement in self-perceived functional ability with daily activities.  Baseline: 0 Goal status: INITIAL  2.  Pt will improve Global Hip/knee strength to a 4+/5 to demonstrate improvement in strength for quality of motion and activity performance. Baseline:  Goal status: INITIAL  3.  Pt will report pain levels improving during ADLs to be less than or equal to 2/10 as to demonstrate improved tolerance with daily functional activities such as work and cleaning.  Baseline:  Goal status: INITIAL  4.  Pt will report the ability to  stand for >/= 90 minutes as to demonstrate improved tolerance to standing for prolonged time and improved ability to participate in work.  Baseline:  Goal status: INITIAL  5.   Pt will independently ambulate 1043ft with no AD and less than 2/10 pain to demonstrate improved weightbearing tolerance, BLE strength, and functional capacity for community ambulation.  Baseline:  Goal status: INITIAL --------------------------------------------------------------------------------------------- PLAN:  PT FREQUENCY: 2x/week  PT DURATION: 8 weeks  PLANNED INTERVENTIONS: 97110-Therapeutic exercises, 97530- Therapeutic activity, [id number]- Neuromuscular re-education, 97535- Self Care, 16109- Manual therapy, [id number]- Gait training, [phone]- Electrical stimulation (manual), 97016- Vasopneumatic device, 97597- Wound care (first 20 sq cm), Patient/Family education, Balance training, Stair training, Taping, Dry Needling, Joint mobilization, Scar mobilization, Compression bandaging, DME instructions, and Cryotherapy  PLAN FOR NEXT SESSION: continue to progress as tolerated   Albesa Huguenin, PT, DPT 08/24/2023, 11:29 AM

## 2023-08-22 NOTE — Therapy (Signed)
 OUTPATIENT PHYSICAL THERAPY TREATMENT  Patient Name: Erica Fox MRN: [id number] DOB:24-Mar-1985, 38 y.o., female Today's Date: 08/22/2023  END OF SESSION:  PT End of Session - 08/22/23 0749     Visit Number 2    Number of Visits 17    Date for PT Re-Evaluation 10/13/23    PT Start Time 0747    PT Stop Time 0825    PT Time Calculation (min) 38 min    Activity Tolerance Patient limited by pain    Behavior During Therapy [facility] for tasks assessed/performed              Past Medical History:  Diagnosis Date   ADHD (attention deficit hyperactivity disorder) 2025   per patient   Anemia    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)    Past Surgical History:  Procedure Laterality Date   CESAREAN SECTION     2007, 2012   EYE SURGERY Right    age 50, tighten muscle for lazy eye   FOOT SURGERY Left    hammertoe correction & screws placed (2 surgeries on left foot)   FOOT SURGERY Right 05/07/2022   hammertoe correction   TOTAL KNEE ARTHROPLASTY Left 07/28/2023   Procedure: ARTHROPLASTY, KNEE, TOTAL;  Surgeon: Jasmine Mesi, MD;  Location: [facility] OR;  Service: Orthopedics;  Laterality: Left;   TUBAL LIGATION     WISDOM TOOTH EXTRACTION  2014   Patient Active Problem List   Diagnosis Date Noted   Chronic pain syndrome 08/17/2023   S/P total knee replacement, left 07/28/2023   Rosacea 02/02/2023   Seasonal allergies 08/26/2022   Rheumatoid arthritis of multiple sites with negative rheumatoid factor (HCC) 07/22/2022    PCP: Abram Abraham, NP-C  REFERRING PROVIDER: Jasmine Mesi, MD  REFERRING DIAG: S/P total knee arthroplasty, left [W09.811]   Rationale for Evaluation and Treatment: Rehabilitation  THERAPY DIAG:  Acute pain of left knee  Stiffness of  left knee, not elsewhere classified  Other abnormalities of gait and mobility  PERTINENT HISTORY: RA, chronic pain syndrome, L TKA 07/28/2023  WEIGHT BEARING RESTRICTIONS: Yes WBAT  FALLS:  Has patient fallen in last 6 months? No  LIVING ENVIRONMENT: Lives with: lives with their spouse Lives in: House/apartment Stairs: No Has following equipment at home: Single point cane and Environmental consultant - 2 wheeled  OCCUPATION: property management   PRECAUTIONS: None ---------------------------------------------------------------------------------------------  SUBJECTIVE:   SUBJECTIVE STATEMENT: Pt attended today's session with reports of 2/10 pain. Pt stated that they have maintained good compliance with current HEP.  Pt stated that they were able to walk around [employer] on Saturday and has been good with icing and elevation.   Eval statement 08/18/2023: was dx with juvenile rheumatoid arthritis. L TKA on 07/28/2023. Struggles with stairs, SLR, and prolonged ambulation. Currently 6/10 pain  RED FLAGS: None   PLOF: Independent  PATIENT GOALS: fix the knee.  NEXT MD VISIT: June 6th ---------------------------------------------------------------------------------------------  OBJECTIVE:  Note: Objective measures were completed at Evaluation unless otherwise noted.  DIAGNOSTIC FINDINGS:  IMPRESSION: Degenerative arthrosis predominantly medial compartmental chondromalacia. There is slight blunting of the medial meniscus without discrete meniscal tear. Small reactive joint effusion.   No erosive changes or evidence of synovitis.   Electronically signed by: Adrien Alberta MD 07/14/2023 02:24 PM  EDT RP Workstation: [id number]    AP lateral merchant radiographs left knee reviewed.  No acute fracture.   Medial and lateral joint spaces reasonably well-maintained.  Moderate  patellofemoral arthritis.  Alignment intact.   PATIENT SURVEYS:  LEFS :0/80  COGNITION: Overall cognitive status:  Within functional limits for tasks assessed     SENSATION: WFL  EDEMA:  Moderate-severe swelling around L knee  POSTURE: No Significant postural limitations  PALPATION: TTP around surgical site  LOWER EXTREMITY ROM:  Active ROM Right eval Left eval  Hip flexion [facility]   Hip extension    Hip abduction    Hip adduction    Hip internal rotation    Hip external rotation    Knee flexion  50  Knee extension  -2  Ankle dorsiflexion    Ankle plantarflexion    Ankle inversion    Ankle eversion     (Blank rows = not tested)  ! Indicates pain with testing  LOWER EXTREMITY MMT: deferred d/t recent surgery  MMT Right eval Left eval  Hip flexion    Hip extension    Hip abduction    Hip adduction    Hip internal rotation    Hip external rotation    Knee flexion    Knee extension    Ankle dorsiflexion    Ankle plantarflexion    Ankle inversion    Ankle eversion     (Blank rows = not tested)  ! Indicates pain with testing  LOWER EXTREMITY SPECIAL TESTS:  deferred d/t recent surgery   GAIT: Distance walked: 244ft Assistive device utilized: None Level of assistance: Complete Independence Comments: pt displays appropriate knee extension during stance phase of LLE  TREATMENT:    [facility] Adult PT Treatment:                                                DATE: 08/22/2023  Therapeutic Exercise: Rec bike, partials 5' PROM into knee flex/ext Heel slide with strap 2x10, hold 4s Neuromuscular re-ed: SLR w/ quad set  2x8, 5s hold up/5s down, strap asssist                                                                                                                            [facility] Adult PT Treatment:                                                DATE: 08/18/2023 Self Care: Pt education, detailed below POC discussion    PATIENT EDUCATION:  Education details: Pt received education regarding HEP performance, ADL performance, functional activity tolerance, impairment  education, appropriate performance of therapeutic activities. Swelling management Person educated: Patient Education method: Explanation,  Demonstration, Tactile cues, Verbal cues, and Handouts Education comprehension: verbalized understanding and returned demonstration  HOME EXERCISE PROGRAM: Access Code: [email or web address] URL: [email or web address] Date: 08/18/2023 Prepared by: Albesa Huguenin  Exercises - Seated Knee Flexion AAROM  - 3 x daily - 7 x weekly - 2-3 sets - 1 reps - 85m hold - Active Straight Leg Raise with Quad Set  - 1 x daily - 7 x weekly - 3 sets - 10 reps - Supine Quad Set  - 1-3 x daily - 7 x weekly - 2-3 sets - 10 reps - 10s hold - Supine Isometric Hamstring Set  - 1 x daily - 7 x weekly - 2-3 sets - 10 reps - 10s hold ---------------------------------------------------------------------------------------------  ASSESSMENT:  CLINICAL IMPRESSION: Pt attended physical therapy session for continuation of treatment regarding L TKA. Today's treatment focused on improvement of  quadriceps recruitment, knee A/PROM and swelling management. Pt showed  fair tolerance to administered treatment with no adverse effects by the end of session. Skilled intervention was utilized via activity modification for pt tolerance with task completion, functional progression/regression promoting best outcomes inline with current rehab goals, as well as minimal verbal/tactile cuing alongside minimal physical assistance for safe and appropriate performance of today's activities.Knee AROM was measure at 0-60 in a supine position today, Continue to progress as tolerated.   Eval impression (08/18/2023): Pt. attended today's physical therapy session for evaluation of L TKA. Pt has complaints of 6/10 pain with difficulty with stairs, SLR, and prolonged ambulation/activity. Pt has notable deficits with quadriceps recruitment, L knee A/PROM and swelling. Pt would benefit from therapeutic focus on quad  recruitment, hip/knee stability, A/PROM, and swelling management.  Treatment performed today focused on pt education detailed in obj. Pt demonstrated great understanding of education provided. required minimal verbal/tactile cues and no physical assistance for appropriate performance with today's activities. Pt requires the intervention of skilled outpatient physical therapy to address the aforementioned deficits and progress towards a functional level in line with therapeutic goals.   OBJECTIVE IMPAIRMENTS: Abnormal gait, decreased activity tolerance, decreased knowledge of use of DME, decreased mobility, difficulty walking, decreased ROM, decreased strength, hypomobility, increased edema, improper body mechanics, and pain.   ACTIVITY LIMITATIONS: sitting, standing, squatting, stairs, transfers, and locomotion level  PARTICIPATION LIMITATIONS: cleaning, laundry, driving, community activity, and occupation  PERSONAL FACTORS: Fitness, Profession, Time since onset of injury/illness/exacerbation, and 1 comorbidity: JRA are also affecting patient's functional outcome.   REHAB POTENTIAL: Good  CLINICAL DECISION MAKING: Stable/uncomplicated  EVALUATION COMPLEXITY: Low   GOALS: Goals reviewed with patient? YES  SHORT TERM GOALS: Target date: 09/15/2023 Pt will be independent with administered HEP to demonstrate the competency necessary for long term managemnet of symptoms at home.  Baseline: Goal status: INITIAL   LONG TERM GOALS: Target date: 10/13/2023   Pt. Will achieve a LEFS score of 60/80 as to demonstrate improvement in self-perceived functional ability with daily activities.  Baseline: 0 Goal status: INITIAL  2.  Pt will improve Global Hip/knee strength to a 4+/5 to demonstrate improvement in strength for quality of motion and activity performance. Baseline:  Goal status: INITIAL  3.  Pt will report pain levels improving during ADLs to be less than or equal to 2/10 as to  demonstrate improved tolerance with daily functional activities such as work and cleaning.  Baseline:  Goal status: INITIAL  4.  Pt will report the ability to stand for >/= 90 minutes as to demonstrate improved tolerance to standing for prolonged time and improved ability  to participate in work.  Baseline:  Goal status: INITIAL  5.   Pt will independently ambulate 1066ft with no AD and less than 2/10 pain to demonstrate improved weightbearing tolerance, BLE strength, and functional capacity for community ambulation.  Baseline:  Goal status: INITIAL --------------------------------------------------------------------------------------------- PLAN:  PT FREQUENCY: 2x/week  PT DURATION: 8 weeks  PLANNED INTERVENTIONS: 97110-Therapeutic exercises, 97530- Therapeutic activity, [id number]- Neuromuscular re-education, 97535- Self Care, 16109- Manual therapy, [id number]- Gait training, [phone]- Electrical stimulation (manual), 97016- Vasopneumatic device, 97597- Wound care (first 20 sq cm), Patient/Family education, Balance training, Stair training, Taping, Dry Needling, Joint mobilization, Scar mobilization, Compression bandaging, DME instructions, and Cryotherapy  PLAN FOR NEXT SESSION: continue to progress as tolerated   Albesa Huguenin, PT, DPT 08/22/2023, 8:27 AM

## 2023-08-23 ENCOUNTER — Ambulatory Visit: Admitting: Physical Therapy

## 2023-08-24 ENCOUNTER — Ambulatory Visit: Payer: Self-pay

## 2023-08-24 DIAGNOSIS — M25662 Stiffness of left knee, not elsewhere classified: Secondary | ICD-10-CM

## 2023-08-24 DIAGNOSIS — M25562 Pain in left knee: Secondary | ICD-10-CM | POA: Diagnosis not present

## 2023-08-24 DIAGNOSIS — R2689 Other abnormalities of gait and mobility: Secondary | ICD-10-CM

## 2023-08-25 ENCOUNTER — Ambulatory Visit (INDEPENDENT_AMBULATORY_CARE_PROVIDER_SITE_OTHER): Admitting: Orthopedic Surgery

## 2023-08-25 DIAGNOSIS — Z96652 Presence of left artificial knee joint: Secondary | ICD-10-CM

## 2023-08-26 ENCOUNTER — Encounter: Payer: Self-pay | Admitting: Orthopedic Surgery

## 2023-08-26 NOTE — Progress Notes (Signed)
   Post-Op Visit Note   Patient: Erica Fox           Date of Birth: 05/04/1985           MRN: [id number] Visit Date: 08/25/2023 PCP: Abram Abraham, NP-C   Assessment & Plan:  Chief Complaint:  Chief Complaint  Patient presents with   Left Knee - Routine Post Op   Visit Diagnoses:  1. S/P total knee arthroplasty, left     Plan: Erica Fox is a 38 year old patient who is now about 4 weeks out left total knee replacement.  She states she is better than she was 2 weeks ago.  On exam she has got 0-70.  Incision intact.  Small little area around the accessory suture which is about 1 mm open but no active drainage.  We can follow that and check her back in 2 weeks.  Would like for her to be close to 90 degrees by that 6-week mark.  Instructed her on patellar mobility exercises.  Follow-Up Instructions: No follow-ups on file.   Orders:  No orders of the defined types were placed in this encounter.  No orders of the defined types were placed in this encounter.   Imaging: No results found.  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   left eye, dx by opthalmology   Degenerative joint disease    OCD (obsessive compulsive disorder) 2025   per patient   PTSD (post-traumatic stress disorder) 2025   per patient   Rheumatoid arthritis (HCC)     Family History  Problem Relation Age of Onset   Heart disease Mother    Breast cancer Paternal Aunt    Healthy Son    Healthy Son     Past Surgical History:  Procedure Laterality Date   CESAREAN SECTION     2007, 2012   EYE  SURGERY Right    age 61, tighten muscle for lazy eye   FOOT SURGERY Left    hammertoe correction & screws placed (2 surgeries on left foot)   FOOT SURGERY Right 05/07/2022   hammertoe correction   TOTAL KNEE ARTHROPLASTY Left 07/28/2023   Procedure: ARTHROPLASTY, KNEE, TOTAL;  Surgeon: Jasmine Mesi, MD;  Location: [facility] OR;  Service: Orthopedics;  Laterality: Left;   TUBAL LIGATION     WISDOM TOOTH EXTRACTION  2014   Social History   Occupational History   Not on file  Tobacco Use   Smoking status: Never    Passive exposure: Past   Smokeless tobacco: Never  Vaping Use   Vaping status: Never Used  Substance and Sexual Activity   Alcohol use: Yes    Comment: maybe a drink once every 2 months   Drug use: Not Currently   Sexual activity: Yes    Partners: Male    Birth control/protection: I.U.D., Surgical    Comment: tubal, Mirena , menarche 38yo, sexual debut 38yo

## 2023-08-26 NOTE — Therapy (Signed)
 OUTPATIENT PHYSICAL THERAPY TREATMENT  Patient Name: Erica Fox MRN: [id number] DOB:[birth year], 38 y.o., female Today's Date: 08/30/2023  END OF SESSION:  PT End of Session - 08/30/23 1614     Visit Number 4    Number of Visits 17    Date for PT Re-Evaluation 10/13/23    Authorization Type [employer]    Authorization - Visit Number 4    Authorization - Number of Visits 60    PT Start Time 1615    PT Stop Time 1700    PT Time Calculation (min) 45 min    Activity Tolerance Patient limited by pain;Patient tolerated treatment well    Behavior During Therapy [facility] for tasks assessed/performed                Past Medical History:  Diagnosis Date   ADHD (attention deficit hyperactivity disorder) 2025   per patient   Anemia    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   left eye, dx by opthalmology   Degenerative joint disease    OCD (obsessive compulsive disorder) 2025   per patient   PTSD (post-traumatic stress disorder) 2025   per patient   Rheumatoid arthritis [facility])    Past Surgical History:  Procedure Laterality Date   CESAREAN SECTION     2007, 2012   EYE SURGERY Right    age 66, tighten muscle for lazy eye   FOOT SURGERY Left    hammertoe correction & screws placed (2 surgeries on left foot)   FOOT SURGERY Right 05/07/2022   hammertoe correction   TOTAL KNEE ARTHROPLASTY Left 07/28/2023   Procedure: ARTHROPLASTY, KNEE, TOTAL;  Surgeon: Jasmine Mesi, MD;  Location: [facility] OR;  Service: Orthopedics;  Laterality: Left;   TUBAL LIGATION     WISDOM TOOTH EXTRACTION  2014   Patient Active Problem List   Diagnosis Date Noted   Chronic pain syndrome 08/17/2023   S/P total knee replacement, left 07/28/2023   Rosacea 02/02/2023   Seasonal allergies 08/26/2022   Rheumatoid arthritis of multiple sites with negative rheumatoid factor (HCC) 07/22/2022    PCP: Abram Abraham, NP-C  REFERRING PROVIDER: Jasmine Mesi, MD  REFERRING DIAG: S/P total knee  arthroplasty, left [Z36.644]   Rationale for Evaluation and Treatment: Rehabilitation  THERAPY DIAG:  Acute pain of left knee  Stiffness of left knee, not elsewhere classified  Other abnormalities of gait and mobility  PERTINENT HISTORY: RA, chronic pain syndrome, L TKA 07/28/2023  WEIGHT BEARING RESTRICTIONS: Yes WBAT  FALLS:  Has patient fallen in last 6 months? No  LIVING ENVIRONMENT: Lives with: lives with their spouse Lives in: House/apartment Stairs: No Has following equipment at home: Single point cane and Environmental consultant - 2 wheeled  OCCUPATION: property management   PRECAUTIONS: None ---------------------------------------------------------------------------------------------  SUBJECTIVE:   SUBJECTIVE STATEMENT: Feels she has more flexion in knee at home.   Eval statement 08/18/2023: was dx with juvenile rheumatoid arthritis. L TKA on 07/28/2023. Struggles with stairs, SLR, and prolonged ambulation. Currently 6/10 pain  RED FLAGS: None   PLOF: Independent  PATIENT GOALS: fix the knee.  NEXT MD VISIT: June 6th ---------------------------------------------------------------------------------------------  OBJECTIVE:  Note: Objective measures were completed at Evaluation unless otherwise noted.  DIAGNOSTIC FINDINGS:  IMPRESSION: Degenerative arthrosis predominantly medial compartmental chondromalacia. There is slight blunting of the medial meniscus without discrete meniscal tear. Small reactive joint effusion.   No erosive changes or evidence of synovitis.   Electronically signed by: Adrien Alberta MD 07/14/2023 02:24 PM EDT RP Workstation: [id number]  AP lateral merchant radiographs left knee reviewed.  No acute fracture.   Medial and lateral joint spaces reasonably well-maintained.  Moderate  patellofemoral arthritis.  Alignment intact.   PATIENT SURVEYS:  LEFS :0/80  COGNITION: Overall cognitive status: Within functional limits for tasks  assessed     SENSATION: WFL  EDEMA:  Moderate-severe swelling around L knee  POSTURE: No Significant postural limitations  PALPATION: TTP around surgical site  LOWER EXTREMITY ROM:  Active ROM Right eval Left eval  Hip flexion [facility]   Hip extension    Hip abduction    Hip adduction    Hip internal rotation    Hip external rotation    Knee flexion  50  Knee extension  -2  Ankle dorsiflexion    Ankle plantarflexion    Ankle inversion    Ankle eversion     (Blank rows = not tested)  ! Indicates pain with testing  LOWER EXTREMITY MMT: deferred d/t recent surgery  MMT Right eval Left eval  Hip flexion    Hip extension    Hip abduction    Hip adduction    Hip internal rotation    Hip external rotation    Knee flexion    Knee extension    Ankle dorsiflexion    Ankle plantarflexion    Ankle inversion    Ankle eversion     (Blank rows = not tested)  ! Indicates pain with testing  LOWER EXTREMITY SPECIAL TESTS:  deferred d/t recent surgery   GAIT: Distance walked: 233ft Assistive device utilized: None Level of assistance: Complete Independence Comments: pt displays appropriate knee extension during stance phase of LLE  TREATMENT:  [facility] Adult PT Treatment:                                                DATE: 08/30/23 Therapeutic Exercise: Nustep L2 seat 10 8 min Neuromuscular re-ed: Seated heel slides with QS 15x2  FAQs B w/adduction 15x2 Sidestep with RED power strap 3 trips Runners step 4 in 10/10 Therapeutic Activity: Heel slides with strap 15x2 73d flexion (scar mobs b/t sets) L knee flexion over 8 in step 15x Heel raises over 4 in step 15x Seated ham curls YTB 15x2  [facility] Adult PT Treatment:                                                DATE: 08/24/23 Therapeutic Exercise: Nustep L2 seat 11 8 min  Neuromuscular re-ed: Seated heel slides with QS 15x2  FAQs L 15x2 Sidestep with yellow power strap  Therapeutic Activity: Heel slides with strap  15x2 69d flexion L knee flexion over 6 in step 15x Heel raises over 4 in step 15x  ICE over bolster 10 min    [facility] Adult PT Treatment:                                                DATE: 08/22/2023  Therapeutic Exercise: Rec bike, partials 5' PROM into knee flex/ext Heel slide with strap 2x10, hold 4s Neuromuscular re-ed: SLR w/ quad set  2x8,  5s hold up/5s down, strap asssist                                                                                                                            [facility] Adult PT Treatment:                                                DATE: 08/18/2023 Self Care: Pt education, detailed below POC discussion    PATIENT EDUCATION:  Education details: Pt received education regarding HEP performance, ADL performance, functional activity tolerance, impairment education, appropriate performance of therapeutic activities. Swelling management Person educated: Patient Education method: Explanation, Demonstration, Tactile cues, Verbal cues, and Handouts Education comprehension: verbalized understanding and returned demonstration  HOME EXERCISE PROGRAM: Access Code: [email or web address] URL: [email or web address] Date: 08/18/2023 Prepared by: Albesa Huguenin  Exercises - Seated Knee Flexion AAROM  - 3 x daily - 7 x weekly - 2-3 sets - 1 reps - 56m hold - Active Straight Leg Raise with Quad Set  - 1 x daily - 7 x weekly - 3 sets - 10 reps - Supine Quad Set  - 1-3 x daily - 7 x weekly - 2-3 sets - 10 reps - 10s hold - Supine Isometric Hamstring Set  - 1 x daily - 7 x weekly - 2-3 sets - 10 reps - 10s hold ---------------------------------------------------------------------------------------------  ASSESSMENT:  CLINICAL IMPRESSION: Continued to focus on flexion, 73-75d today following heel slides and scar mobs.  Able to advance resistance, challenge and difficulty as noted.  Incorporated SL tasks, stepping and proprioceptive work today.   Eval  impression (08/18/2023): Pt. attended today's physical therapy session for evaluation of L TKA. Pt has complaints of 6/10 pain with difficulty with stairs, SLR, and prolonged ambulation/activity. Pt has notable deficits with quadriceps recruitment, L knee A/PROM and swelling. Pt would benefit from therapeutic focus on quad recruitment, hip/knee stability, A/PROM, and swelling management.  Treatment performed today focused on pt education detailed in obj. Pt demonstrated great understanding of education provided. required minimal verbal/tactile cues and no physical assistance for appropriate performance with today's activities. Pt requires the intervention of skilled outpatient physical therapy to address the aforementioned deficits and progress towards a functional level in line with therapeutic goals.   OBJECTIVE IMPAIRMENTS: Abnormal gait, decreased activity tolerance, decreased knowledge of use of DME, decreased mobility, difficulty walking, decreased ROM, decreased strength, hypomobility, increased edema, improper body mechanics, and pain.   ACTIVITY LIMITATIONS: sitting, standing, squatting, stairs, transfers, and locomotion level  PARTICIPATION LIMITATIONS: cleaning, laundry, driving, community activity, and occupation  PERSONAL FACTORS: Fitness, Profession, Time since onset of injury/illness/exacerbation, and 1 comorbidity: JRA are also affecting patient's functional outcome.   REHAB POTENTIAL: Good  CLINICAL DECISION MAKING: Stable/uncomplicated  EVALUATION COMPLEXITY: Low   GOALS: Goals reviewed with patient? YES  SHORT TERM GOALS: Target date:  09/15/2023 Pt will be independent with administered HEP to demonstrate the competency necessary for long term managemnet of symptoms at home.  Baseline: Goal status: INITIAL   LONG TERM GOALS: Target date: 10/13/2023   Pt. Will achieve a LEFS score of 60/80 as to demonstrate improvement in self-perceived functional ability with daily  activities.  Baseline: 0 Goal status: INITIAL  2.  Pt will improve Global Hip/knee strength to a 4+/5 to demonstrate improvement in strength for quality of motion and activity performance. Baseline:  Goal status: INITIAL  3.  Pt will report pain levels improving during ADLs to be less than or equal to 2/10 as to demonstrate improved tolerance with daily functional activities such as work and cleaning.  Baseline:  Goal status: INITIAL  4.  Pt will report the ability to stand for >/= 90 minutes as to demonstrate improved tolerance to standing for prolonged time and improved ability to participate in work.  Baseline:  Goal status: INITIAL  5.   Pt will independently ambulate 1074ft with no AD and less than 2/10 pain to demonstrate improved weightbearing tolerance, BLE strength, and functional capacity for community ambulation.  Baseline:  Goal status: INITIAL --------------------------------------------------------------------------------------------- PLAN:  PT FREQUENCY: 2x/week  PT DURATION: 8 weeks  PLANNED INTERVENTIONS: 97110-Therapeutic exercises, 97530- Therapeutic activity, [id number]- Neuromuscular re-education, 97535- Self Care, 16109- Manual therapy, [id number]- Gait training, [phone]- Electrical stimulation (manual), 97016- Vasopneumatic device, 97597- Wound care (first 20 sq cm), Patient/Family education, Balance training, Stair training, Taping, Dry Needling, Joint mobilization, Scar mobilization, Compression bandaging, DME instructions, and Cryotherapy  PLAN FOR NEXT SESSION: continue to progress as tolerated   Albesa Huguenin, PT, DPT 08/30/2023, 5:07 PM

## 2023-08-30 ENCOUNTER — Ambulatory Visit

## 2023-08-30 DIAGNOSIS — M25562 Pain in left knee: Secondary | ICD-10-CM | POA: Diagnosis not present

## 2023-08-30 DIAGNOSIS — M25662 Stiffness of left knee, not elsewhere classified: Secondary | ICD-10-CM

## 2023-08-30 DIAGNOSIS — R2689 Other abnormalities of gait and mobility: Secondary | ICD-10-CM

## 2023-08-31 ENCOUNTER — Other Ambulatory Visit: Payer: Self-pay

## 2023-08-31 NOTE — Telephone Encounter (Signed)
 PUS ordered 07/07/23 for LLQ pain  Left message to call Aleda Hurl, RN at [place], [phone], option 5.

## 2023-08-31 NOTE — Therapy (Signed)
 OUTPATIENT PHYSICAL THERAPY TREATMENT  Patient Name: Erica Fox MRN: [id number] DOB:1986-01-18, 38 y.o., female Today's Date: 09/01/2023  END OF SESSION:  PT End of Session - 09/01/23 1456     Visit Number 5    Number of Visits 17    Date for PT Re-Evaluation 10/13/23    Authorization Type UHC    PT Start Time 1400    PT Stop Time 1445    PT Time Calculation (min) 45 min    Activity Tolerance Patient limited by pain;Patient tolerated treatment well    Behavior During Therapy [facility] for tasks assessed/performed              Past Medical History:  Diagnosis Date   ADHD (attention deficit hyperactivity disorder) 2025   per patient   Anemia    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   left eye, dx by opthalmology   Degenerative joint disease    OCD (obsessive compulsive disorder) 2025   per patient   PTSD (post-traumatic stress disorder) 2025   per patient   Rheumatoid arthritis [facility])    Past Surgical History:  Procedure Laterality Date   CESAREAN SECTION     2007, 2012   EYE SURGERY Right    age 73, tighten muscle for lazy eye   FOOT SURGERY Left    hammertoe correction & screws placed (2 surgeries on left foot)   FOOT SURGERY Right 05/07/2022   hammertoe correction   TOTAL KNEE ARTHROPLASTY Left 07/28/2023   Procedure: ARTHROPLASTY, KNEE, TOTAL;  Surgeon: Jasmine Mesi, MD;  Location: [facility] OR;  Service: Orthopedics;  Laterality: Left;   TUBAL LIGATION     WISDOM TOOTH EXTRACTION  2014   Patient Active Problem List   Diagnosis Date Noted   Chronic pain syndrome 08/17/2023   S/P total knee replacement, left 07/28/2023   Rosacea 02/02/2023   Seasonal allergies 08/26/2022   Rheumatoid arthritis of multiple sites with negative rheumatoid factor (HCC) 07/22/2022    PCP: Abram Abraham, NP-C  REFERRING PROVIDER: Jasmine Mesi, MD  REFERRING DIAG: S/P total knee arthroplasty, left [V56.433]   Rationale for Evaluation and Treatment:  Rehabilitation  THERAPY DIAG:  Acute pain of left knee  Stiffness of left knee, not elsewhere classified  Other abnormalities of gait and mobility  PERTINENT HISTORY: RA, chronic pain syndrome, L TKA 07/28/2023  WEIGHT BEARING RESTRICTIONS: Yes WBAT  FALLS:  Has patient fallen in last 6 months? No  LIVING ENVIRONMENT: Lives with: lives with their spouse Lives in: House/apartment Stairs: No Has following equipment at home: Single point cane and Environmental consultant - 2 wheeled  OCCUPATION: property management   PRECAUTIONS: None ---------------------------------------------------------------------------------------------  SUBJECTIVE:   SUBJECTIVE STATEMENT: ADLs are getting easier.   Eval statement 08/18/2023: was dx with juvenile rheumatoid arthritis. L TKA on 07/28/2023. Struggles with stairs, SLR, and prolonged ambulation. Currently 6/10 pain  RED FLAGS: None   PLOF: Independent  PATIENT GOALS: fix the knee.  NEXT MD VISIT: June 6th ---------------------------------------------------------------------------------------------  OBJECTIVE:  Note: Objective measures were completed at Evaluation unless otherwise noted.  DIAGNOSTIC FINDINGS:  IMPRESSION: Degenerative arthrosis predominantly medial compartmental chondromalacia. There is slight blunting of the medial meniscus without discrete meniscal tear. Small reactive joint effusion.   No erosive changes or evidence of synovitis.   Electronically signed by: Adrien Alberta MD 07/14/2023 02:24 PM EDT RP Workstation: [id number]    AP lateral merchant radiographs left knee reviewed.  No acute fracture.   Medial and lateral joint spaces reasonably well-maintained.  Moderate  patellofemoral arthritis.  Alignment intact.   PATIENT SURVEYS:  LEFS :0/80  COGNITION: Overall cognitive status: Within functional limits for tasks assessed     SENSATION: WFL  EDEMA:  Moderate-severe swelling around L knee  POSTURE: No  Significant postural limitations  PALPATION: TTP around surgical site  LOWER EXTREMITY ROM:  Active ROM Right eval Left eval  Hip flexion [facility]   Hip extension    Hip abduction    Hip adduction    Hip internal rotation    Hip external rotation    Knee flexion  50  Knee extension  -2  Ankle dorsiflexion    Ankle plantarflexion    Ankle inversion    Ankle eversion     (Blank rows = not tested)  ! Indicates pain with testing  LOWER EXTREMITY MMT: deferred d/t recent surgery  MMT Right eval Left eval  Hip flexion    Hip extension    Hip abduction    Hip adduction    Hip internal rotation    Hip external rotation    Knee flexion    Knee extension    Ankle dorsiflexion    Ankle plantarflexion    Ankle inversion    Ankle eversion     (Blank rows = not tested)  ! Indicates pain with testing  LOWER EXTREMITY SPECIAL TESTS:  deferred d/t recent surgery   GAIT: Distance walked: 222ft Assistive device utilized: None Level of assistance: Complete Independence Comments: pt displays appropriate knee extension during stance phase of LLE  TREATMENT:  [facility] Adult PT Treatment:                                                DATE: 09/01/23 Therapeutic Exercise: Nustep L4 50-60 SPM 8 min Gastroc stretch 30s x2 Neuromuscular re-ed: FAQs B w/adduction 15x2 Sidestep with RED power strap 3 trips Runners step 4 in 10/10 Therapeutic Activity: Heel slides with strap 15x2 76d flexion (scar mobs b/t sets) L knee flexion over 10 in step 15x 80d Heel raises over 4 in step 15x Seated ham curls RTB 15x2  [facility] Adult PT Treatment:                                                DATE: 08/30/23 Therapeutic Exercise: Nustep L2 seat 10 8 min Neuromuscular re-ed: Seated heel slides with QS 15x2  FAQs B w/adduction 15x2 Sidestep with RED power strap 3 trips Runners step 4 in 10/10 Therapeutic Activity: Heel slides with strap 15x2 73d flexion (scar mobs b/t sets) L knee flexion over 8  in step 15x Heel raises over 4 in step 15x Seated ham curls YTB 15x2  [facility] Adult PT Treatment:                                                DATE: 08/24/23 Therapeutic Exercise: Nustep L2 seat 11 8 min  Neuromuscular re-ed: Seated heel slides with QS 15x2  FAQs L 15x2 Sidestep with yellow power strap  Therapeutic Activity: Heel slides with strap 15x2 69d flexion L knee flexion over 6 in  step 15x Heel raises over 4 in step 15x  ICE over bolster 10 min    [facility] Adult PT Treatment:                                                DATE: 08/22/2023  Therapeutic Exercise: Rec bike, partials 5' PROM into knee flex/ext Heel slide with strap 2x10, hold 4s Neuromuscular re-ed: SLR w/ quad set  2x8, 5s hold up/5s down, strap asssist                                                                                                                            [facility] Adult PT Treatment:                                                DATE: 08/18/2023 Self Care: Pt education, detailed below POC discussion    PATIENT EDUCATION:  Education details: Pt received education regarding HEP performance, ADL performance, functional activity tolerance, impairment education, appropriate performance of therapeutic activities. Swelling management Person educated: Patient Education method: Explanation, Demonstration, Tactile cues, Verbal cues, and Handouts Education comprehension: verbalized understanding and returned demonstration  HOME EXERCISE PROGRAM: Access Code: [email or web address] URL: [email or web address] Date: 08/18/2023 Prepared by: Albesa Huguenin  Exercises - Seated Knee Flexion AAROM  - 3 x daily - 7 x weekly - 2-3 sets - 1 reps - 105m hold - Active Straight Leg Raise with Quad Set  - 1 x daily - 7 x weekly - 3 sets - 10 reps - Supine Quad Set  - 1-3 x daily - 7 x weekly - 2-3 sets - 10 reps - 10s hold - Supine Isometric Hamstring Set  - 1 x daily - 7 x weekly - 2-3 sets - 10 reps - 10s  hold ---------------------------------------------------------------------------------------------  ASSESSMENT:  CLINICAL IMPRESSION: Continued to focus on flexion, 80d measured with flexion on step.  Increased resistance and difficulty of tasks as noted.  Extension ROM is good.   Eval impression (08/18/2023): Pt. attended today's physical therapy session for evaluation of L TKA. Pt has complaints of 6/10 pain with difficulty with stairs, SLR, and prolonged ambulation/activity. Pt has notable deficits with quadriceps recruitment, L knee A/PROM and swelling. Pt would benefit from therapeutic focus on quad recruitment, hip/knee stability, A/PROM, and swelling management.  Treatment performed today focused on pt education detailed in obj. Pt demonstrated great understanding of education provided. required minimal verbal/tactile cues and no physical assistance for appropriate performance with today's activities. Pt requires the intervention of skilled outpatient physical therapy to address the aforementioned deficits and progress towards a functional level in line with therapeutic goals.   OBJECTIVE IMPAIRMENTS: Abnormal gait, decreased activity tolerance, decreased knowledge  of use of DME, decreased mobility, difficulty walking, decreased ROM, decreased strength, hypomobility, increased edema, improper body mechanics, and pain.   ACTIVITY LIMITATIONS: sitting, standing, squatting, stairs, transfers, and locomotion level  PARTICIPATION LIMITATIONS: cleaning, laundry, driving, community activity, and occupation  PERSONAL FACTORS: Fitness, Profession, Time since onset of injury/illness/exacerbation, and 1 comorbidity: JRA are also affecting patient's functional outcome.   REHAB POTENTIAL: Good  CLINICAL DECISION MAKING: Stable/uncomplicated  EVALUATION COMPLEXITY: Low   GOALS: Goals reviewed with patient? YES  SHORT TERM GOALS: Target date: 09/15/2023 Pt will be independent with administered  HEP to demonstrate the competency necessary for long term managemnet of symptoms at home.  Baseline: Goal status: INITIAL   LONG TERM GOALS: Target date: 10/13/2023   Pt. Will achieve a LEFS score of 60/80 as to demonstrate improvement in self-perceived functional ability with daily activities.  Baseline: 0 Goal status: INITIAL  2.  Pt will improve Global Hip/knee strength to a 4+/5 to demonstrate improvement in strength for quality of motion and activity performance. Baseline:  Goal status: INITIAL  3.  Pt will report pain levels improving during ADLs to be less than or equal to 2/10 as to demonstrate improved tolerance with daily functional activities such as work and cleaning.  Baseline:  Goal status: INITIAL  4.  Pt will report the ability to stand for >/= 90 minutes as to demonstrate improved tolerance to standing for prolonged time and improved ability to participate in work.  Baseline:  Goal status: INITIAL  5.   Pt will independently ambulate 109ft with no AD and less than 2/10 pain to demonstrate improved weightbearing tolerance, BLE strength, and functional capacity for community ambulation.  Baseline:  Goal status: INITIAL --------------------------------------------------------------------------------------------- PLAN:  PT FREQUENCY: 2x/week  PT DURATION: 8 weeks  PLANNED INTERVENTIONS: 97110-Therapeutic exercises, 97530- Therapeutic activity, [id number]- Neuromuscular re-education, 97535- Self Care, 16109- Manual therapy, [id number]- Gait training, [phone]- Electrical stimulation (manual), 97016- Vasopneumatic device, 97597- Wound care (first 20 sq cm), Patient/Family education, Balance training, Stair training, Taping, Dry Needling, Joint mobilization, Scar mobilization, Compression bandaging, DME instructions, and Cryotherapy  PLAN FOR NEXT SESSION: continue to progress as tolerated   Albesa Huguenin, PT, DPT 09/01/2023, 3:16 PM

## 2023-09-01 ENCOUNTER — Ambulatory Visit

## 2023-09-01 DIAGNOSIS — M25562 Pain in left knee: Secondary | ICD-10-CM | POA: Diagnosis not present

## 2023-09-01 DIAGNOSIS — M25662 Stiffness of left knee, not elsewhere classified: Secondary | ICD-10-CM

## 2023-09-01 DIAGNOSIS — R2689 Other abnormalities of gait and mobility: Secondary | ICD-10-CM

## 2023-09-05 NOTE — Therapy (Signed)
 OUTPATIENT PHYSICAL THERAPY TREATMENT  Patient Name: Erica Fox MRN: [id number] DOB:07/17/1985, 38 y.o., female Today's Date: 09/06/2023  END OF SESSION:  PT End of Session - 09/06/23 1617     Visit Number 6    Number of Visits 17    Date for PT Re-Evaluation 10/13/23    Authorization Type UHC    Authorization - Visit Number 6    Authorization - Number of Visits 60    PT Start Time 1615    PT Stop Time 1700    PT Time Calculation (min) 45 min    Activity Tolerance Patient limited by pain;Patient tolerated treatment well    Behavior During Therapy [facility] for tasks assessed/performed               Past Medical History:  Diagnosis Date   ADHD (attention deficit hyperactivity disorder) 2025   per patient   Anemia    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   left eye, dx by opthalmology   Degenerative joint disease    OCD (obsessive compulsive disorder) 2025   per patient   PTSD (post-traumatic stress disorder) 2025   per patient   Rheumatoid arthritis [facility])    Past Surgical History:  Procedure Laterality Date   CESAREAN SECTION     2007, 2012   EYE SURGERY Right    age 50, tighten muscle for lazy eye   FOOT SURGERY Left    hammertoe correction & screws placed (2 surgeries on left foot)   FOOT SURGERY Right 05/07/2022   hammertoe correction   TOTAL KNEE ARTHROPLASTY Left 07/28/2023   Procedure: ARTHROPLASTY, KNEE, TOTAL;  Surgeon: Jasmine Mesi, MD;  Location: [facility] OR;  Service: Orthopedics;  Laterality: Left;   TUBAL LIGATION     WISDOM TOOTH EXTRACTION  2014   Patient Active Problem List   Diagnosis Date Noted   Chronic pain syndrome 08/17/2023   S/P total knee replacement, left 07/28/2023   Rosacea 02/02/2023   Seasonal allergies 08/26/2022   Rheumatoid arthritis of multiple sites with negative rheumatoid factor (HCC) 07/22/2022    PCP: Abram Abraham, NP-C  REFERRING PROVIDER: Jasmine Mesi, MD  REFERRING DIAG: S/P total knee  arthroplasty, left [W09.811]   Rationale for Evaluation and Treatment: Rehabilitation  THERAPY DIAG:  Acute pain of left knee  Stiffness of left knee, not elsewhere classified  Other abnormalities of gait and mobility  PERTINENT HISTORY: RA, chronic pain syndrome, L TKA 07/28/2023  WEIGHT BEARING RESTRICTIONS: Yes WBAT  FALLS:  Has patient fallen in last 6 months? No  LIVING ENVIRONMENT: Lives with: lives with their spouse Lives in: House/apartment Stairs: No Has following equipment at home: Single point cane and Environmental consultant - 2 wheeled  OCCUPATION: property management   PRECAUTIONS: None ---------------------------------------------------------------------------------------------  SUBJECTIVE:   SUBJECTIVE STATEMENT: Little to no pain reported, just stiffness.  Drove 16 hrs over the weekend w/o setback   Eval statement 08/18/2023: was dx with juvenile rheumatoid arthritis. L TKA on 07/28/2023. Struggles with stairs, SLR, and prolonged ambulation. Currently 6/10 pain  RED FLAGS: None   PLOF: Independent  PATIENT GOALS: fix the knee.  NEXT MD VISIT: June 6th ---------------------------------------------------------------------------------------------  OBJECTIVE:  Note: Objective measures were completed at Evaluation unless otherwise noted.  DIAGNOSTIC FINDINGS:  IMPRESSION: Degenerative arthrosis predominantly medial compartmental chondromalacia. There is slight blunting of the medial meniscus without discrete meniscal tear. Small reactive joint effusion.   No erosive changes or evidence of synovitis.   Electronically signed by: Adrien Alberta MD 07/14/2023  02:24 PM EDT RP Workstation: [id number]    AP lateral merchant radiographs left knee reviewed.  No acute fracture.   Medial and lateral joint spaces reasonably well-maintained.  Moderate  patellofemoral arthritis.  Alignment intact.   PATIENT SURVEYS:  LEFS :0/80  COGNITION: Overall cognitive status:  Within functional limits for tasks assessed     SENSATION: WFL  EDEMA:  Moderate-severe swelling around L knee  POSTURE: No Significant postural limitations  PALPATION: TTP around surgical site  LOWER EXTREMITY ROM:  Active ROM Right eval Left eval  Hip flexion [facility]   Hip extension    Hip abduction    Hip adduction    Hip internal rotation    Hip external rotation    Knee flexion  50  Knee extension  -2  Ankle dorsiflexion    Ankle plantarflexion    Ankle inversion    Ankle eversion     (Blank rows = not tested)  ! Indicates pain with testing  LOWER EXTREMITY MMT: deferred d/t recent surgery  MMT Right eval Left eval  Hip flexion    Hip extension    Hip abduction    Hip adduction    Hip internal rotation    Hip external rotation    Knee flexion    Knee extension    Ankle dorsiflexion    Ankle plantarflexion    Ankle inversion    Ankle eversion     (Blank rows = not tested)  ! Indicates pain with testing  LOWER EXTREMITY SPECIAL TESTS:  deferred d/t recent surgery   GAIT: Distance walked: 26ft Assistive device utilized: None Level of assistance: Complete Independence Comments: pt displays appropriate knee extension during stance phase of LLE  TREATMENT:  [facility] Adult PT Treatment:                                                DATE: 09/06/23 Therapeutic Exercise: Nustep L4 60-80 SPM 8 min Gastroc/soleus stretch 30s x2 ea. Neuromuscular re-ed: FAQs B w/adduction 15x2 Sidestep with RED power strap 3 trips Runners step 6 in 10/10 Therapeutic Activity: Heel slides with strap 15x2 85d (scar mobs b/t sets) posterior tibial mobs 10x10 L knee flexion over 10 in step 15x 80d Heel raises over 6 in step 15x Seated ham curls GTB 15x2  [facility] Adult PT Treatment:                                                DATE: 09/01/23 Therapeutic Exercise: Nustep L4 50-60 SPM 8 min Gastroc stretch 30s x2 Neuromuscular re-ed: FAQs B w/adduction 15x2 Sidestep with  RED power strap 3 trips Runners step 4 in 10/10 Therapeutic Activity: Heel slides with strap 15x2 76d flexion (scar mobs b/t sets) L knee flexion over 10 in step 15x 80d Heel raises over 4 in step 15x Seated ham curls RTB 15x2  [facility] Adult PT Treatment:                                                DATE: 08/30/23 Therapeutic Exercise: Nustep L2 seat 10 8 min Neuromuscular re-ed:  Seated heel slides with QS 15x2  FAQs B w/adduction 15x2 Sidestep with RED power strap 3 trips Runners step 4 in 10/10 Therapeutic Activity: Heel slides with strap 15x2 73d flexion (scar mobs b/t sets) L knee flexion over 8 in step 15x Heel raises over 4 in step 15x Seated ham curls YTB 15x2  [facility] Adult PT Treatment:                                                DATE: 08/24/23 Therapeutic Exercise: Nustep L2 seat 11 8 min  Neuromuscular re-ed: Seated heel slides with QS 15x2  FAQs L 15x2 Sidestep with yellow power strap  Therapeutic Activity: Heel slides with strap 15x2 69d flexion L knee flexion over 6 in step 15x Heel raises over 4 in step 15x  ICE over bolster 10 min    [facility] Adult PT Treatment:                                                DATE: 08/22/2023  Therapeutic Exercise: Rec bike, partials 5' PROM into knee flex/ext Heel slide with strap 2x10, hold 4s Neuromuscular re-ed: SLR w/ quad set  2x8, 5s hold up/5s down, strap asssist                                                                                                                            [facility] Adult PT Treatment:                                                DATE: 08/18/2023 Self Care: Pt education, detailed below POC discussion    PATIENT EDUCATION:  Education details: Pt received education regarding HEP performance, ADL performance, functional activity tolerance, impairment education, appropriate performance of therapeutic activities. Swelling management Person educated: Patient Education method: Explanation,  Demonstration, Tactile cues, Verbal cues, and Handouts Education comprehension: verbalized understanding and returned demonstration  HOME EXERCISE PROGRAM: Access Code: [email or web address] URL: [email or web address] Date: 08/18/2023 Prepared by: Albesa Huguenin  Exercises - Seated Knee Flexion AAROM  - 3 x daily - 7 x weekly - 2-3 sets - 1 reps - 29m hold - Active Straight Leg Raise with Quad Set  - 1 x daily - 7 x weekly - 3 sets - 10 reps - Supine Quad Set  - 1-3 x daily - 7 x weekly - 2-3 sets - 10 reps - 10s hold - Supine Isometric Hamstring Set  - 1 x daily - 7 x weekly - 2-3 sets - 10 reps - 10s hold ---------------------------------------------------------------------------------------------  ASSESSMENT:  CLINICAL IMPRESSION: Continued to emphasize flexion with posterior tibial mobilizations added during heel slides.  Able to demo 85d flexion following manual interventions.  Increased height of step to challenge balance and proprioception.   Eval impression (08/18/2023): Pt. attended today's physical therapy session for evaluation of L TKA. Pt has complaints of 6/10 pain with difficulty with stairs, SLR, and prolonged ambulation/activity. Pt has notable deficits with quadriceps recruitment, L knee A/PROM and swelling. Pt would benefit from therapeutic focus on quad recruitment, hip/knee stability, A/PROM, and swelling management.  Treatment performed today focused on pt education detailed in obj. Pt demonstrated great understanding of education provided. required minimal verbal/tactile cues and no physical assistance for appropriate performance with today's activities. Pt requires the intervention of skilled outpatient physical therapy to address the aforementioned deficits and progress towards a functional level in line with therapeutic goals.   OBJECTIVE IMPAIRMENTS: Abnormal gait, decreased activity tolerance, decreased knowledge of use of DME, decreased mobility, difficulty  walking, decreased ROM, decreased strength, hypomobility, increased edema, improper body mechanics, and pain.   ACTIVITY LIMITATIONS: sitting, standing, squatting, stairs, transfers, and locomotion level  PARTICIPATION LIMITATIONS: cleaning, laundry, driving, community activity, and occupation  PERSONAL FACTORS: Fitness, Profession, Time since onset of injury/illness/exacerbation, and 1 comorbidity: JRA are also affecting patient's functional outcome.   REHAB POTENTIAL: Good  CLINICAL DECISION MAKING: Stable/uncomplicated  EVALUATION COMPLEXITY: Low   GOALS: Goals reviewed with patient? YES  SHORT TERM GOALS: Target date: 09/15/2023 Pt will be independent with administered HEP to demonstrate the competency necessary for long term managemnet of symptoms at home.  Baseline: Goal status: INITIAL   LONG TERM GOALS: Target date: 10/13/2023   Pt. Will achieve a LEFS score of 60/80 as to demonstrate improvement in self-perceived functional ability with daily activities.  Baseline: 0 Goal status: INITIAL  2.  Pt will improve Global Hip/knee strength to a 4+/5 to demonstrate improvement in strength for quality of motion and activity performance. Baseline:  Goal status: INITIAL  3.  Pt will report pain levels improving during ADLs to be less than or equal to 2/10 as to demonstrate improved tolerance with daily functional activities such as work and cleaning.  Baseline:  Goal status: INITIAL  4.  Pt will report the ability to stand for >/= 90 minutes as to demonstrate improved tolerance to standing for prolonged time and improved ability to participate in work.  Baseline:  Goal status: INITIAL  5.   Pt will independently ambulate 1076ft with no AD and less than 2/10 pain to demonstrate improved weightbearing tolerance, BLE strength, and functional capacity for community ambulation.  Baseline:  Goal status:  INITIAL --------------------------------------------------------------------------------------------- PLAN:  PT FREQUENCY: 2x/week  PT DURATION: 8 weeks  PLANNED INTERVENTIONS: 97110-Therapeutic exercises, 97530- Therapeutic activity, [id number]- Neuromuscular re-education, 97535- Self Care, 29528- Manual therapy, [id number]- Gait training, [phone]- Electrical stimulation (manual), 97016- Vasopneumatic device, 97597- Wound care (first 20 sq cm), Patient/Family education, Balance training, Stair training, Taping, Dry Needling, Joint mobilization, Scar mobilization, Compression bandaging, DME instructions, and Cryotherapy  PLAN FOR NEXT SESSION: continue to progress as tolerated   Jeff Elouise Divelbiss PT  09/06/2023, 4:55 PM

## 2023-09-05 NOTE — Telephone Encounter (Signed)
 error

## 2023-09-06 ENCOUNTER — Ambulatory Visit

## 2023-09-06 DIAGNOSIS — M25562 Pain in left knee: Secondary | ICD-10-CM | POA: Diagnosis not present

## 2023-09-06 DIAGNOSIS — M25662 Stiffness of left knee, not elsewhere classified: Secondary | ICD-10-CM

## 2023-09-06 DIAGNOSIS — R2689 Other abnormalities of gait and mobility: Secondary | ICD-10-CM

## 2023-09-07 ENCOUNTER — Ambulatory Visit

## 2023-09-07 DIAGNOSIS — M25562 Pain in left knee: Secondary | ICD-10-CM

## 2023-09-07 DIAGNOSIS — M25662 Stiffness of left knee, not elsewhere classified: Secondary | ICD-10-CM

## 2023-09-07 DIAGNOSIS — R2689 Other abnormalities of gait and mobility: Secondary | ICD-10-CM

## 2023-09-07 NOTE — Therapy (Signed)
 OUTPATIENT PHYSICAL THERAPY TREATMENT  Patient Name: Erica Fox MRN: [id number] DOB:[birth year], 38 y.o., female Today's Date: 09/07/2023  END OF SESSION:  PT End of Session - 09/07/23 1711     Visit Number 7    Number of Visits 17    Date for PT Re-Evaluation 10/13/23    Authorization Type [employer]    Authorization - Visit Number 7    Authorization - Number of Visits 60    PT Start Time 1700    PT Stop Time 1745    PT Time Calculation (min) 45 min    Activity Tolerance Patient limited by pain;Patient tolerated treatment well    Behavior During Therapy [facility] for tasks assessed/performed                Past Medical History:  Diagnosis Date   ADHD (attention deficit hyperactivity disorder) 2025   per patient   Anemia    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   left eye, dx by opthalmology   Degenerative joint disease    OCD (obsessive compulsive disorder) 2025   per patient   PTSD (post-traumatic stress disorder) 2025   per patient   Rheumatoid arthritis [facility])    Past Surgical History:  Procedure Laterality Date   CESAREAN SECTION     2007, 2012   EYE SURGERY Right    age 28, tighten muscle for lazy eye   FOOT SURGERY Left    hammertoe correction & screws placed (2 surgeries on left foot)   FOOT SURGERY Right 05/07/2022   hammertoe correction   TOTAL KNEE ARTHROPLASTY Left 07/28/2023   Procedure: ARTHROPLASTY, KNEE, TOTAL;  Surgeon: Jasmine Mesi, MD;  Location: [facility] OR;  Service: Orthopedics;  Laterality: Left;   TUBAL LIGATION     WISDOM TOOTH EXTRACTION  2014   Patient Active Problem List   Diagnosis Date Noted   Chronic pain syndrome 08/17/2023   S/P total knee replacement, left 07/28/2023   Rosacea 02/02/2023   Seasonal allergies 08/26/2022   Rheumatoid arthritis of multiple sites with negative rheumatoid factor (HCC) 07/22/2022    PCP: Abram Abraham, NP-C  REFERRING PROVIDER: Jasmine Mesi, MD  REFERRING DIAG: S/P total knee  arthroplasty, left [W09.811]   Rationale for Evaluation and Treatment: Rehabilitation  THERAPY DIAG:  Acute pain of left knee  Stiffness of left knee, not elsewhere classified  Other abnormalities of gait and mobility  PERTINENT HISTORY: RA, chronic pain syndrome, L TKA 07/28/2023  WEIGHT BEARING RESTRICTIONS: Yes WBAT  FALLS:  Has patient fallen in last 6 months? No  LIVING ENVIRONMENT: Lives with: lives with their spouse Lives in: House/apartment Stairs: No Has following equipment at home: Single point cane and Environmental consultant - 2 wheeled  OCCUPATION: property management   PRECAUTIONS: None ---------------------------------------------------------------------------------------------  SUBJECTIVE:   SUBJECTIVE STATEMENT: Little to no pain reported, just stiffness.  Drove 16 hrs over the weekend w/o setback   Eval statement 08/18/2023: was dx with juvenile rheumatoid arthritis. L TKA on 07/28/2023. Struggles with stairs, SLR, and prolonged ambulation. Currently 6/10 pain  RED FLAGS: None   PLOF: Independent  PATIENT GOALS: fix the knee.  NEXT MD VISIT: June 6th ---------------------------------------------------------------------------------------------  OBJECTIVE:  Note: Objective measures were completed at Evaluation unless otherwise noted.  DIAGNOSTIC FINDINGS:  IMPRESSION: Degenerative arthrosis predominantly medial compartmental chondromalacia. There is slight blunting of the medial meniscus without discrete meniscal tear. Small reactive joint effusion.   No erosive changes or evidence of synovitis.   Electronically signed by: Adrien Alberta MD  07/14/2023 02:24 PM EDT RP Workstation: [id number]    AP lateral merchant radiographs left knee reviewed.  No acute fracture.   Medial and lateral joint spaces reasonably well-maintained.  Moderate  patellofemoral arthritis.  Alignment intact.   PATIENT SURVEYS:  LEFS :0/80  COGNITION: Overall cognitive status:  Within functional limits for tasks assessed     SENSATION: WFL  EDEMA:  Moderate-severe swelling around L knee  POSTURE: No Significant postural limitations  PALPATION: TTP around surgical site  LOWER EXTREMITY ROM:  Active ROM Right eval Left eval  Hip flexion [facility]   Hip extension    Hip abduction    Hip adduction    Hip internal rotation    Hip external rotation    Knee flexion  50  Knee extension  -2  Ankle dorsiflexion    Ankle plantarflexion    Ankle inversion    Ankle eversion     (Blank rows = not tested)  ! Indicates pain with testing  LOWER EXTREMITY MMT: deferred d/t recent surgery  MMT Right eval Left eval  Hip flexion    Hip extension    Hip abduction    Hip adduction    Hip internal rotation    Hip external rotation    Knee flexion    Knee extension    Ankle dorsiflexion    Ankle plantarflexion    Ankle inversion    Ankle eversion     (Blank rows = not tested)  ! Indicates pain with testing  LOWER EXTREMITY SPECIAL TESTS:  deferred d/t recent surgery   GAIT: Distance walked: 247ft Assistive device utilized: None Level of assistance: Complete Independence Comments: pt displays appropriate knee extension during stance phase of LLE  TREATMENT:  [facility] Adult PT Treatment:                                                DATE: 09/07/23 Therapeutic Exercise: Heel slides with strap 15x2 90d (scar mobs b/t sets) posterior tibial mobs 10x10 Heel raises over 4 in step 15x Seated ham curls GTB 15x2 Neuromuscular re-ed: Sidestep with RED power strap 3 trips Runners step 8 in 10/10 Therapeutic Activity: Heel slides with strap 15x2 85d (scar mobs b/t sets) posterior tibial mobs 10x10 L knee flexion over 10 in step 15x 80d Heel raises over 6 in step 15x Seated ham curls GTB 15x2  [facility] Adult PT Treatment:                                                DATE: 09/06/23 Therapeutic Exercise: Nustep L4 60-80 SPM 8 min Gastroc/soleus stretch 30s x2  ea. Neuromuscular re-ed: FAQs B w/adduction 15x2 Sidestep with RED power strap 3 trips Runners step 6 in 10/10 Therapeutic Activity: Heel slides with strap 15x2 85d (scar mobs b/t sets) posterior tibial mobs 10x10 L knee flexion over 10 in step 15x 80d Heel raises over 6 in step 15x Seated ham curls GTB 15x2  [facility] Adult PT Treatment:  DATE: 09/01/23 Therapeutic Exercise: Nustep L4 50-60 SPM 8 min Gastroc stretch 30s x2 Neuromuscular re-ed: FAQs B w/adduction 15x2 Sidestep with RED power strap 3 trips Runners step 4 in 10/10 Therapeutic Activity: Heel slides with strap 15x2 76d flexion (scar mobs b/t sets) L knee flexion over 10 in step 15x 80d Heel raises over 4 in step 15x Seated ham curls RTB 15x2     PATIENT EDUCATION:  Education details: Pt received education regarding HEP performance, ADL performance, functional activity tolerance, impairment education, appropriate performance of therapeutic activities. Swelling management Person educated: Patient Education method: Explanation, Demonstration, Tactile cues, Verbal cues, and Handouts Education comprehension: verbalized understanding and returned demonstration  HOME EXERCISE PROGRAM: Access Code: [email or web address] URL: [email or web address] Date: 08/18/2023 Prepared by: Albesa Huguenin  Exercises - Seated Knee Flexion AAROM  - 3 x daily - 7 x weekly - 2-3 sets - 1 reps - 86m hold - Active Straight Leg Raise with Quad Set  - 1 x daily - 7 x weekly - 3 sets - 10 reps - Supine Quad Set  - 1-3 x daily - 7 x weekly - 2-3 sets - 10 reps - 10s hold - Supine Isometric Hamstring Set  - 1 x daily - 7 x weekly - 2-3 sets - 10 reps - 10s hold ---------------------------------------------------------------------------------------------  ASSESSMENT:  CLINICAL IMPRESSION: Continued to emphasize flexion with posterior tibial mobilizations added during heel slides.  Able to demo 90d  flexion following manual interventions.  Increased height of step to challenge balance and proprioception with runners step task.  Patient continues to demo increased ROM w/o any increase in discomfort including manual mobs.  Gait pattern much more fluid.   Eval impression (08/18/2023): Pt. attended today's physical therapy session for evaluation of L TKA. Pt has complaints of 6/10 pain with difficulty with stairs, SLR, and prolonged ambulation/activity. Pt has notable deficits with quadriceps recruitment, L knee A/PROM and swelling. Pt would benefit from therapeutic focus on quad recruitment, hip/knee stability, A/PROM, and swelling management.  Treatment performed today focused on pt education detailed in obj. Pt demonstrated great understanding of education provided. required minimal verbal/tactile cues and no physical assistance for appropriate performance with today's activities. Pt requires the intervention of skilled outpatient physical therapy to address the aforementioned deficits and progress towards a functional level in line with therapeutic goals.   OBJECTIVE IMPAIRMENTS: Abnormal gait, decreased activity tolerance, decreased knowledge of use of DME, decreased mobility, difficulty walking, decreased ROM, decreased strength, hypomobility, increased edema, improper body mechanics, and pain.   ACTIVITY LIMITATIONS: sitting, standing, squatting, stairs, transfers, and locomotion level  PARTICIPATION LIMITATIONS: cleaning, laundry, driving, community activity, and occupation  PERSONAL FACTORS: Fitness, Profession, Time since onset of injury/illness/exacerbation, and 1 comorbidity: JRA are also affecting patient's functional outcome.   REHAB POTENTIAL: Good  CLINICAL DECISION MAKING: Stable/uncomplicated  EVALUATION COMPLEXITY: Low   GOALS: Goals reviewed with patient? YES  SHORT TERM GOALS: Target date: 09/15/2023 Pt will be independent with administered HEP to demonstrate the  competency necessary for long term managemnet of symptoms at home.  Baseline: [id number] Goal status: INITIAL   LONG TERM GOALS: Target date: 10/13/2023   Pt. Will achieve a LEFS score of 60/80 as to demonstrate improvement in self-perceived functional ability with daily activities.  Baseline: 0 Goal status: INITIAL  2.  Pt will improve Global Hip/knee strength to a 4+/5 to demonstrate improvement in strength for quality of motion and activity performance. Baseline:  Goal status: INITIAL  3.  Pt will report pain  levels improving during ADLs to be less than or equal to 2/10 as to demonstrate improved tolerance with daily functional activities such as work and cleaning.  Baseline:  Goal status: INITIAL  4.  Pt will report the ability to stand for >/= 90 minutes as to demonstrate improved tolerance to standing for prolonged time and improved ability to participate in work.  Baseline:  Goal status: INITIAL  5.   Pt will independently ambulate 1089ft with no AD and less than 2/10 pain to demonstrate improved weightbearing tolerance, BLE strength, and functional capacity for community ambulation.  Baseline:  Goal status: INITIAL --------------------------------------------------------------------------------------------- PLAN:  PT FREQUENCY: 2x/week  PT DURATION: 8 weeks  PLANNED INTERVENTIONS: 97110-Therapeutic exercises, 97530- Therapeutic activity, [id number]- Neuromuscular re-education, 97535- Self Care, 16109- Manual therapy, [id number]- Gait training, [phone]- Electrical stimulation (manual), 97016- Vasopneumatic device, 97597- Wound care (first 20 sq cm), Patient/Family education, Balance training, Stair training, Taping, Dry Needling, Joint mobilization, Scar mobilization, Compression bandaging, DME instructions, and Cryotherapy  PLAN FOR NEXT SESSION: continue to progress as tolerated   Jeff Ladale Sherburn PT  09/07/2023, 5:44 PM

## 2023-09-12 ENCOUNTER — Ambulatory Visit (INDEPENDENT_AMBULATORY_CARE_PROVIDER_SITE_OTHER): Admitting: Orthopedic Surgery

## 2023-09-12 ENCOUNTER — Encounter: Payer: Self-pay | Admitting: Orthopedic Surgery

## 2023-09-12 DIAGNOSIS — Z96652 Presence of left artificial knee joint: Secondary | ICD-10-CM

## 2023-09-12 NOTE — Progress Notes (Signed)
   Post-Op Visit Note   Patient: Erica Fox           Date of Birth: 1985-07-06           MRN: [id number] Visit Date: 09/12/2023 PCP: Erica Boby CROME, NP-C   Assessment & Plan:  Chief Complaint:  Chief Complaint  Patient presents with   Right Knee - Routine Post Op   Visit Diagnoses:  1. S/P total knee arthroplasty, left     Plan: Erica Fox is a 38 year old patient who is now about 5 weeks out left total knee replacement.  Achieved 90 degrees of flexion on Wednesday.  On exam she has well-healed incision.  Still about a 2 to 3 mm area of granulation tissue which is healing in.  Do not want her to get any submersion.  She has been working in Therapist, sports.  No calf tenderness negative Homans.  Continue doing home exercise program at least 1 hour a day working on flexion.  Continue weightbearing as tolerated.  Follow-up in 6 weeks for clinical recheck.  Follow-Up Instructions: Return in about 6 weeks (around 10/24/2023).   Orders:  No orders of the defined types were placed in this encounter.  No orders of the defined types were placed in this encounter.   Imaging: No results found.  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   left eye, dx by opthalmology   Degenerative joint disease    OCD (obsessive compulsive disorder) 2025   per patient   PTSD (post-traumatic stress disorder) 2025   per patient   Rheumatoid arthritis (HCC)     Family History  Problem Relation Age of Onset   Heart disease Mother    Breast cancer Paternal Aunt    Healthy Son    Healthy  Son     Past Surgical History:  Procedure Laterality Date   CESAREAN SECTION     2007, 2012   EYE SURGERY Right    age 23, tighten muscle for lazy eye   FOOT SURGERY Left    hammertoe correction & screws placed (2 surgeries on left foot)   FOOT SURGERY Right 05/07/2022   hammertoe correction   TOTAL KNEE ARTHROPLASTY Left 07/28/2023   Procedure: ARTHROPLASTY, KNEE, TOTAL;  Surgeon: Addie Cordella Hamilton, MD;  Location: [facility] OR;  Service: Orthopedics;  Laterality: Left;   TUBAL LIGATION     WISDOM TOOTH EXTRACTION  2014   Social History   Occupational History   Not on file  Tobacco Use   Smoking status: Never    Passive exposure: Past   Smokeless tobacco: Never  Vaping Use   Vaping status: Never Used  Substance and Sexual Activity   Alcohol use: Yes    Comment: maybe a drink once every 2 months   Drug use: Not Currently   Sexual activity: Yes    Partners: Male    Birth control/protection: I.U.D., Surgical    Comment: tubal, Mirena , menarche 38yo, sexual debut 38yo

## 2023-09-12 NOTE — Therapy (Unsigned)
 OUTPATIENT PHYSICAL THERAPY TREATMENT  Patient Name: Erica Fox MRN: [id number] DOB:Jan 18, 1986, 38 y.o., female Today's Date: 09/13/2023  END OF SESSION:  PT End of Session - 09/13/23 1619     Visit Number 8    Number of Visits 17    Date for PT Re-Evaluation 10/13/23    Authorization Type UHC    Authorization - Visit Number 8    Authorization - Number of Visits 60    PT Start Time 1615    PT Stop Time 1700    PT Time Calculation (min) 45 min    Activity Tolerance Patient limited by pain;Patient tolerated treatment well    Behavior During Therapy [facility] for tasks assessed/performed                 Past Medical History:  Diagnosis Date   ADHD (attention deficit hyperactivity disorder) 2025   per patient   Anemia    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   left eye, dx by opthalmology   Degenerative joint disease    OCD (obsessive compulsive disorder) 2025   per patient   PTSD (post-traumatic stress disorder) 2025   per patient   Rheumatoid arthritis [facility])    Past Surgical History:  Procedure Laterality Date   CESAREAN SECTION     2007, 2012   EYE SURGERY Right    age 76, tighten muscle for lazy eye   FOOT SURGERY Left    hammertoe correction & screws placed (2 surgeries on left foot)   FOOT SURGERY Right 05/07/2022   hammertoe correction   TOTAL KNEE ARTHROPLASTY Left 07/28/2023   Procedure: ARTHROPLASTY, KNEE, TOTAL;  Surgeon: Addie Cordella Hamilton, MD;  Location: [facility] OR;  Service: Orthopedics;  Laterality: Left;   TUBAL LIGATION     WISDOM TOOTH EXTRACTION  2014   Patient Active Problem List   Diagnosis Date Noted   Chronic pain syndrome 08/17/2023   S/P total knee replacement, left 07/28/2023   Rosacea 02/02/2023   Seasonal allergies 08/26/2022   Rheumatoid arthritis of multiple sites with negative rheumatoid factor (HCC) 07/22/2022    PCP: Lendia Boby CROME, NP-C  REFERRING PROVIDER: Addie Cordella Hamilton, MD  REFERRING DIAG: S/P total knee  arthroplasty, left [S03.347]   Rationale for Evaluation and Treatment: Rehabilitation  THERAPY DIAG:  Acute pain of left knee  Stiffness of left knee, not elsewhere classified  Other abnormalities of gait and mobility  PERTINENT HISTORY: RA, chronic pain syndrome, L TKA 07/28/2023  WEIGHT BEARING RESTRICTIONS: Yes WBAT  FALLS:  Has patient fallen in last 6 months? No  LIVING ENVIRONMENT: Lives with: lives with their spouse Lives in: House/apartment Stairs: No Has following equipment at home: Single point cane and Environmental consultant - 2 wheeled  OCCUPATION: property management   PRECAUTIONS: None ---------------------------------------------------------------------------------------------  SUBJECTIVE:   SUBJECTIVE STATEMENT: Saw MD yesterday, no changes in POC recommended.   Eval statement 08/18/2023: was dx with juvenile rheumatoid arthritis. L TKA on 07/28/2023. Struggles with stairs, SLR, and prolonged ambulation. Currently 6/10 pain  RED FLAGS: None   PLOF: Independent  PATIENT GOALS: fix the knee.  NEXT MD VISIT: June 6th ---------------------------------------------------------------------------------------------  OBJECTIVE:  Note: Objective measures were completed at Evaluation unless otherwise noted.  DIAGNOSTIC FINDINGS:  IMPRESSION: Degenerative arthrosis predominantly medial compartmental chondromalacia. There is slight blunting of the medial meniscus without discrete meniscal tear. Small reactive joint effusion.   No erosive changes or evidence of synovitis.   Electronically signed by: Norleen Satchel MD 07/14/2023 02:24 PM EDT RP Workstation: [id number]  AP lateral merchant radiographs left knee reviewed.  No acute fracture.   Medial and lateral joint spaces reasonably well-maintained.  Moderate  patellofemoral arthritis.  Alignment intact.   PATIENT SURVEYS:  LEFS :0/80  COGNITION: Overall cognitive status: Within functional limits for tasks  assessed     SENSATION: WFL  EDEMA:  Moderate-severe swelling around L knee  POSTURE: No Significant postural limitations  PALPATION: TTP around surgical site  LOWER EXTREMITY ROM:  Active ROM Right eval Left eval  Hip flexion [facility]   Hip extension    Hip abduction    Hip adduction    Hip internal rotation    Hip external rotation    Knee flexion  50  Knee extension  -2  Ankle dorsiflexion    Ankle plantarflexion    Ankle inversion    Ankle eversion     (Blank rows = not tested)  ! Indicates pain with testing  LOWER EXTREMITY MMT: deferred d/t recent surgery  MMT Right eval Left eval  Hip flexion    Hip extension    Hip abduction    Hip adduction    Hip internal rotation    Hip external rotation    Knee flexion    Knee extension    Ankle dorsiflexion    Ankle plantarflexion    Ankle inversion    Ankle eversion     (Blank rows = not tested)  ! Indicates pain with testing  LOWER EXTREMITY SPECIAL TESTS:  deferred d/t recent surgery   GAIT: Distance walked: 219ft Assistive device utilized: None Level of assistance: Complete Independence Comments: pt displays appropriate knee extension during stance phase of LLE  TREATMENT:  [facility] Adult PT Treatment:                                                DATE: 09/13/23 Therapeutic Exercise: Heel slides with strap 15x2 94d (scar mobs b/t sets) posterior tibial mobs 10x10 Heel raises over 4 in step 15x Seated ham curls GTB 15x2 Neuromuscular re-ed: Sidestep with GREEN power strap 3 trips Runners step 8 in 15/15 5# KB Therapeutic Activity: Heel slides with strap 15x2 85d (scar mobs b/t sets) posterior tibial mobs 10x10 L knee flexion over 10 in step 15x 80d FAQs w/adduction 15x2  [facility] Adult PT Treatment:                                                DATE: 09/07/23 Therapeutic Exercise: Heel slides with strap 15x2 90d (scar mobs b/t sets) posterior tibial mobs 10x10 Heel raises over 4 in step 15x Seated  ham curls GTB 15x2 Neuromuscular re-ed: Sidestep with RED power strap 3 trips Runners step 8 in 10/10 Therapeutic Activity: Heel slides with strap 15x2 85d (scar mobs b/t sets) posterior tibial mobs 10x10 L knee flexion over 10 in step 15x 80d Heel raises over 6 in step 15x Seated ham curls GTB 15x2  [facility] Adult PT Treatment:                                                DATE: 09/06/23  Therapeutic Exercise: Nustep L4 60-80 SPM 8 min Gastroc/soleus stretch 30s x2 ea. Neuromuscular re-ed: FAQs B w/adduction 15x2 Sidestep with RED power strap 3 trips Runners step 6 in 10/10 Therapeutic Activity: Heel slides with strap 15x2 85d (scar mobs b/t sets) posterior tibial mobs 10x10 L knee flexion over 10 in step 15x 80d Heel raises over 6 in step 15x Seated ham curls GTB 15x2  [facility] Adult PT Treatment:                                                DATE: 09/01/23 Therapeutic Exercise: Nustep L4 50-60 SPM 8 min Gastroc stretch 30s x2 Neuromuscular re-ed: FAQs B w/adduction 15x2 Sidestep with RED power strap 3 trips Runners step 4 in 10/10 Therapeutic Activity: Heel slides with strap 15x2 76d flexion (scar mobs b/t sets) L knee flexion over 10 in step 15x 80d Heel raises over 4 in step 15x Seated ham curls RTB 15x2     PATIENT EDUCATION:  Education details: Pt received education regarding HEP performance, ADL performance, functional activity tolerance, impairment education, appropriate performance of therapeutic activities. Swelling management Person educated: Patient Education method: Explanation, Demonstration, Tactile cues, Verbal cues, and Handouts Education comprehension: verbalized understanding and returned demonstration  HOME EXERCISE PROGRAM: Access Code: [email or web address] URL: [email or web address] Date: 08/18/2023 Prepared by: Mabel Kiang  Exercises - Seated Knee Flexion AAROM  - 3 x daily - 7 x weekly - 2-3 sets - 1 reps - 45m hold - Active Straight Leg  Raise with Quad Set  - 1 x daily - 7 x weekly - 3 sets - 10 reps - Supine Quad Set  - 1-3 x daily - 7 x weekly - 2-3 sets - 10 reps - 10s hold - Supine Isometric Hamstring Set  - 1 x daily - 7 x weekly - 2-3 sets - 10 reps - 10s hold ---------------------------------------------------------------------------------------------  ASSESSMENT:  CLINICAL IMPRESSION: Added additional tasks to promote ROM and strength as well as function.  Increased resistance and difficulty as noted.  ROM 94d flexion.   Eval impression (08/18/2023): Pt. attended today's physical therapy session for evaluation of L TKA. Pt has complaints of 6/10 pain with difficulty with stairs, SLR, and prolonged ambulation/activity. Pt has notable deficits with quadriceps recruitment, L knee A/PROM and swelling. Pt would benefit from therapeutic focus on quad recruitment, hip/knee stability, A/PROM, and swelling management.  Treatment performed today focused on pt education detailed in obj. Pt demonstrated great understanding of education provided. required minimal verbal/tactile cues and no physical assistance for appropriate performance with today's activities. Pt requires the intervention of skilled outpatient physical therapy to address the aforementioned deficits and progress towards a functional level in line with therapeutic goals.   OBJECTIVE IMPAIRMENTS: Abnormal gait, decreased activity tolerance, decreased knowledge of use of DME, decreased mobility, difficulty walking, decreased ROM, decreased strength, hypomobility, increased edema, improper body mechanics, and pain.   ACTIVITY LIMITATIONS: sitting, standing, squatting, stairs, transfers, and locomotion level  PARTICIPATION LIMITATIONS: cleaning, laundry, driving, community activity, and occupation  PERSONAL FACTORS: Fitness, Profession, Time since onset of injury/illness/exacerbation, and 1 comorbidity: JRA are also affecting patient's functional outcome.   REHAB  POTENTIAL: Good  CLINICAL DECISION MAKING: Stable/uncomplicated  EVALUATION COMPLEXITY: Low   GOALS: Goals reviewed with patient? YES  SHORT TERM GOALS: Target date: 09/15/2023 Pt will be independent with administered HEP to demonstrate the  competency necessary for long term managemnet of symptoms at home.  Baseline: [email or web address] Goal status: INITIAL   LONG TERM GOALS: Target date: 10/13/2023   Pt. Will achieve a LEFS score of 60/80 as to demonstrate improvement in self-perceived functional ability with daily activities.  Baseline: 0 Goal status: INITIAL  2.  Pt will improve Global Hip/knee strength to a 4+/5 to demonstrate improvement in strength for quality of motion and activity performance. Baseline:  Goal status: INITIAL  3.  Pt will report pain levels improving during ADLs to be less than or equal to 2/10 as to demonstrate improved tolerance with daily functional activities such as work and cleaning.  Baseline:  Goal status: INITIAL  4.  Pt will report the ability to stand for >/= 90 minutes as to demonstrate improved tolerance to standing for prolonged time and improved ability to participate in work.  Baseline:  Goal status: INITIAL  5.   Pt will independently ambulate 1010ft with no AD and less than 2/10 pain to demonstrate improved weightbearing tolerance, BLE strength, and functional capacity for community ambulation.  Baseline:  Goal status: INITIAL --------------------------------------------------------------------------------------------- PLAN:  PT FREQUENCY: 2x/week  PT DURATION: 8 weeks  PLANNED INTERVENTIONS: 97110-Therapeutic exercises, 97530- Therapeutic activity, [id number]- Neuromuscular re-education, 97535- Self Care, [id number]- Manual therapy, [id number]- Gait training, [phone]- Electrical stimulation (manual), 97016- Vasopneumatic device, 97597- Wound care (first 20 sq cm), Patient/Family education, Balance training, Stair training, Taping, Dry Needling, Joint  mobilization, Scar mobilization, Compression bandaging, DME instructions, and Cryotherapy  PLAN FOR NEXT SESSION: continue to progress as tolerated   Jeff Zenaida Tesar PT  09/13/2023, 4:58 PM

## 2023-09-13 ENCOUNTER — Ambulatory Visit

## 2023-09-13 DIAGNOSIS — M25562 Pain in left knee: Secondary | ICD-10-CM | POA: Diagnosis not present

## 2023-09-13 DIAGNOSIS — R2689 Other abnormalities of gait and mobility: Secondary | ICD-10-CM

## 2023-09-13 DIAGNOSIS — M25662 Stiffness of left knee, not elsewhere classified: Secondary | ICD-10-CM

## 2023-09-14 NOTE — Therapy (Unsigned)
 OUTPATIENT PHYSICAL THERAPY TREATMENT  Patient Name: Erica Fox MRN: [id number] DOB:Dec 17, 1985, 38 y.o., female Today's Date: 09/15/2023  END OF SESSION:  PT End of Session - 09/15/23 1619     Visit Number 9    Number of Visits 17    Date for PT Re-Evaluation 10/13/23    Authorization Type UHC    PT Start Time 1615    PT Stop Time 1700    PT Time Calculation (min) 45 min    Activity Tolerance Patient limited by pain;Patient tolerated treatment well    Behavior During Therapy [facility] for tasks assessed/performed                  Past Medical History:  Diagnosis Date   ADHD (attention deficit hyperactivity disorder) 2025   per patient   Anemia    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   left eye, dx by opthalmology   Degenerative joint disease    OCD (obsessive compulsive disorder) 2025   per patient   PTSD (post-traumatic stress disorder) 2025   per patient   Rheumatoid arthritis [facility])    Past Surgical History:  Procedure Laterality Date   CESAREAN SECTION     2007, 2012   EYE SURGERY Right    age 36, tighten muscle for lazy eye   FOOT SURGERY Left    hammertoe correction & screws placed (2 surgeries on left foot)   FOOT SURGERY Right 05/07/2022   hammertoe correction   TOTAL KNEE ARTHROPLASTY Left 07/28/2023   Procedure: ARTHROPLASTY, KNEE, TOTAL;  Surgeon: Addie Cordella Hamilton, MD;  Location: [facility] OR;  Service: Orthopedics;  Laterality: Left;   TUBAL LIGATION     WISDOM TOOTH EXTRACTION  2014   Patient Active Problem List   Diagnosis Date Noted   Chronic pain syndrome 08/17/2023   S/P total knee replacement, left 07/28/2023   Rosacea 02/02/2023   Seasonal allergies 08/26/2022   Rheumatoid arthritis of multiple sites with negative rheumatoid factor (HCC) 07/22/2022    PCP: Lendia Boby CROME, NP-C  REFERRING PROVIDER: Addie Cordella Hamilton, MD  REFERRING DIAG: S/P total knee arthroplasty, left [S03.347]   Rationale for Evaluation and Treatment:  Rehabilitation  THERAPY DIAG:  Acute pain of left knee  Stiffness of left knee, not elsewhere classified  Other abnormalities of gait and mobility  PERTINENT HISTORY: RA, chronic pain syndrome, L TKA 07/28/2023  WEIGHT BEARING RESTRICTIONS: Yes WBAT  FALLS:  Has patient fallen in last 6 months? No  LIVING ENVIRONMENT: Lives with: lives with their spouse Lives in: House/apartment Stairs: No Has following equipment at home: Single point cane and Environmental consultant - 2 wheeled  OCCUPATION: property management   PRECAUTIONS: None ---------------------------------------------------------------------------------------------  SUBJECTIVE:   SUBJECTIVE STATEMENT: Knee    Eval statement 08/18/2023: was dx with juvenile rheumatoid arthritis. L TKA on 07/28/2023. Struggles with stairs, SLR, and prolonged ambulation. Currently 6/10 pain  RED FLAGS: None   PLOF: Independent  PATIENT GOALS: fix the knee.  NEXT MD VISIT: June 6th ---------------------------------------------------------------------------------------------  OBJECTIVE:  Note: Objective measures were completed at Evaluation unless otherwise noted.  DIAGNOSTIC FINDINGS:  IMPRESSION: Degenerative arthrosis predominantly medial compartmental chondromalacia. There is slight blunting of the medial meniscus without discrete meniscal tear. Small reactive joint effusion.   No erosive changes or evidence of synovitis.   Electronically signed by: Norleen Satchel MD 07/14/2023 02:24 PM EDT RP Workstation: [id number]    AP lateral merchant radiographs left knee reviewed.  No acute fracture.   Medial and lateral joint spaces reasonably  well-maintained.  Moderate  patellofemoral arthritis.  Alignment intact.   PATIENT SURVEYS:  LEFS :0/80  COGNITION: Overall cognitive status: Within functional limits for tasks assessed     SENSATION: WFL  EDEMA:  Moderate-severe swelling around L knee  POSTURE: No Significant postural  limitations  PALPATION: TTP around surgical site  LOWER EXTREMITY ROM:  Active ROM Right eval Left eval  Hip flexion [facility]   Hip extension    Hip abduction    Hip adduction    Hip internal rotation    Hip external rotation    Knee flexion  50  Knee extension  -2  Ankle dorsiflexion    Ankle plantarflexion    Ankle inversion    Ankle eversion     (Blank rows = not tested)  ! Indicates pain with testing  LOWER EXTREMITY MMT: deferred d/t recent surgery  MMT Right eval Left eval  Hip flexion    Hip extension    Hip abduction    Hip adduction    Hip internal rotation    Hip external rotation    Knee flexion    Knee extension    Ankle dorsiflexion    Ankle plantarflexion    Ankle inversion    Ankle eversion     (Blank rows = not tested)  ! Indicates pain with testing  LOWER EXTREMITY SPECIAL TESTS:  deferred d/t recent surgery   GAIT: Distance walked: 237ft Assistive device utilized: None Level of assistance: Complete Independence Comments: pt displays appropriate knee extension during stance phase of LLE  TREATMENT:  [facility] Adult PT Treatment:                                                DATE: 09/15/23 Therapeutic Exercise: Nustep L2 8 min 80 SPM Heel raises over 4 in step 15x Seated ham curls GTB 15x2 Neuromuscular re-ed: Sidestep with GREEN power strap 3 trips Runners step 8 in 15/15 5# KB Therapeutic Activity: Heel slides with strap 15x2 95d  L knee flexion over 12 in step 15x  FAQs w/adduction 15x2  [facility] Adult PT Treatment:                                                DATE: 09/13/23 Therapeutic Exercise: Heel slides with strap 15x2 94d (scar mobs b/t sets) posterior tibial mobs 10x10 Heel raises over 4 in step 15x Seated ham curls GTB 15x2 Neuromuscular re-ed: Sidestep with GREEN power strap 3 trips Runners step 8 in 15/15 5# KB Therapeutic Activity: Heel slides with strap 15x2 85d (scar mobs b/t sets) posterior tibial mobs 10x10 L knee  flexion over 10 in step 15x 80d FAQs w/adduction 15x2  [facility] Adult PT Treatment:                                                DATE: 09/07/23 Therapeutic Exercise: Heel slides with strap 15x2 90d (scar mobs b/t sets) posterior tibial mobs 10x10 Heel raises over 4 in step 15x Seated ham curls GTB 15x2 Neuromuscular re-ed: Sidestep with RED power strap 3 trips Runners step 8 in  10/10 Therapeutic Activity: Heel slides with strap 15x2 85d (scar mobs b/t sets) posterior tibial mobs 10x10 L knee flexion over 10 in step 15x 80d Heel raises over 6 in step 15x Seated ham curls GTB 15x2  [facility] Adult PT Treatment:                                                DATE: 09/06/23 Therapeutic Exercise: Nustep L4 60-80 SPM 8 min Gastroc/soleus stretch 30s x2 ea. Neuromuscular re-ed: FAQs B w/adduction 15x2 Sidestep with RED power strap 3 trips Runners step 6 in 10/10 Therapeutic Activity: Heel slides with strap 15x2 85d (scar mobs b/t sets) posterior tibial mobs 10x10 L knee flexion over 10 in step 15x 80d Heel raises over 6 in step 15x Seated ham curls GTB 15x2  [facility] Adult PT Treatment:                                                DATE: 09/01/23 Therapeutic Exercise: Nustep L4 50-60 SPM 8 min Gastroc stretch 30s x2 Neuromuscular re-ed: FAQs B w/adduction 15x2 Sidestep with RED power strap 3 trips Runners step 4 in 10/10 Therapeutic Activity: Heel slides with strap 15x2 76d flexion (scar mobs b/t sets) L knee flexion over 10 in step 15x 80d Heel raises over 4 in step 15x Seated ham curls RTB 15x2     PATIENT EDUCATION:  Education details: Pt received education regarding HEP performance, ADL performance, functional activity tolerance, impairment education, appropriate performance of therapeutic activities. Swelling management Person educated: Patient Education method: Explanation, Demonstration, Tactile cues, Verbal cues, and Handouts Education comprehension: verbalized understanding  and returned demonstration  HOME EXERCISE PROGRAM: Access Code: [email or web address] URL: [email or web address] Date: 08/18/2023 Prepared by: Mabel Kiang  Exercises - Seated Knee Flexion AAROM  - 3 x daily - 7 x weekly - 2-3 sets - 1 reps - 50m hold - Active Straight Leg Raise with Quad Set  - 1 x daily - 7 x weekly - 3 sets - 10 reps - Supine Quad Set  - 1-3 x daily - 7 x weekly - 2-3 sets - 10 reps - 10s hold - Supine Isometric Hamstring Set  - 1 x daily - 7 x weekly - 2-3 sets - 10 reps - 10s hold ---------------------------------------------------------------------------------------------  ASSESSMENT:  CLINICAL IMPRESSION: Arrived with knee stiff/sore from training at work.  Able to obtain 94d flexion.  Increased weight on runners step exercise and added hip flexor/quad stretch on L   Eval impression (08/18/2023): Pt. attended today's physical therapy session for evaluation of L TKA. Pt has complaints of 6/10 pain with difficulty with stairs, SLR, and prolonged ambulation/activity. Pt has notable deficits with quadriceps recruitment, L knee A/PROM and swelling. Pt would benefit from therapeutic focus on quad recruitment, hip/knee stability, A/PROM, and swelling management.  Treatment performed today focused on pt education detailed in obj. Pt demonstrated great understanding of education provided. required minimal verbal/tactile cues and no physical assistance for appropriate performance with today's activities. Pt requires the intervention of skilled outpatient physical therapy to address the aforementioned deficits and progress towards a functional level in line with therapeutic goals.   OBJECTIVE IMPAIRMENTS: Abnormal gait, decreased activity tolerance, decreased knowledge of use of DME, decreased  mobility, difficulty walking, decreased ROM, decreased strength, hypomobility, increased edema, improper body mechanics, and pain.   ACTIVITY LIMITATIONS: sitting, standing, squatting,  stairs, transfers, and locomotion level  PARTICIPATION LIMITATIONS: cleaning, laundry, driving, community activity, and occupation  PERSONAL FACTORS: Fitness, Profession, Time since onset of injury/illness/exacerbation, and 1 comorbidity: JRA are also affecting patient's functional outcome.   REHAB POTENTIAL: Good  CLINICAL DECISION MAKING: Stable/uncomplicated  EVALUATION COMPLEXITY: Low   GOALS: Goals reviewed with patient? YES  SHORT TERM GOALS: Target date: 09/15/2023 Pt will be independent with administered HEP to demonstrate the competency necessary for long term managemnet of symptoms at home.  Baseline: [email or web address] Goal status: INITIAL   LONG TERM GOALS: Target date: 10/13/2023   Pt. Will achieve a LEFS score of 60/80 as to demonstrate improvement in self-perceived functional ability with daily activities.  Baseline: 0 Goal status: INITIAL  2.  Pt will improve Global Hip/knee strength to a 4+/5 to demonstrate improvement in strength for quality of motion and activity performance. Baseline:  Goal status: INITIAL  3.  Pt will report pain levels improving during ADLs to be less than or equal to 2/10 as to demonstrate improved tolerance with daily functional activities such as work and cleaning.  Baseline:  Goal status: INITIAL  4.  Pt will report the ability to stand for >/= 90 minutes as to demonstrate improved tolerance to standing for prolonged time and improved ability to participate in work.  Baseline:  Goal status: INITIAL  5.   Pt will independently ambulate 1058ft with no AD and less than 2/10 pain to demonstrate improved weightbearing tolerance, BLE strength, and functional capacity for community ambulation.  Baseline:  Goal status: INITIAL --------------------------------------------------------------------------------------------- PLAN:  PT FREQUENCY: 2x/week  PT DURATION: 8 weeks  PLANNED INTERVENTIONS: 97110-Therapeutic exercises, 97530- Therapeutic  activity, [id number]- Neuromuscular re-education, 97535- Self Care, 02859- Manual therapy, [id number]- Gait training, [phone]- Electrical stimulation (manual), 97016- Vasopneumatic device, 97597- Wound care (first 20 sq cm), Patient/Family education, Balance training, Stair training, Taping, Dry Needling, Joint mobilization, Scar mobilization, Compression bandaging, DME instructions, and Cryotherapy  PLAN FOR NEXT SESSION: continue to progress as tolerated   Jeff Meadow Abramo PT  09/15/2023, 5:03 PM

## 2023-09-15 ENCOUNTER — Ambulatory Visit

## 2023-09-15 DIAGNOSIS — M25662 Stiffness of left knee, not elsewhere classified: Secondary | ICD-10-CM

## 2023-09-15 DIAGNOSIS — M25562 Pain in left knee: Secondary | ICD-10-CM

## 2023-09-15 DIAGNOSIS — R2689 Other abnormalities of gait and mobility: Secondary | ICD-10-CM

## 2023-09-20 ENCOUNTER — Ambulatory Visit: Admitting: Family Medicine

## 2023-09-20 ENCOUNTER — Ambulatory Visit: Admitting: Physical Therapy

## 2023-09-20 NOTE — Therapy (Incomplete)
 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   left eye, dx by opthalmology   Degenerative joint disease    OCD (obsessive compulsive disorder) 2025   per patient   PTSD (post-traumatic stress disorder) 2025   per patient   Rheumatoid arthritis [facility])    Past Surgical History:  Procedure Laterality Date   CESAREAN SECTION     2007, 2012   EYE SURGERY Right    age 71, tighten muscle for lazy eye   FOOT SURGERY Left    hammertoe correction & screws placed (2 surgeries on left foot)   FOOT SURGERY Right 05/07/2022   hammertoe correction   TOTAL KNEE ARTHROPLASTY Left 07/28/2023   Procedure: ARTHROPLASTY, KNEE, TOTAL;  Surgeon: Addie Cordella Hamilton, MD;  Location: [facility] OR;  Service: Orthopedics;  Laterality: Left;   TUBAL LIGATION     WISDOM TOOTH EXTRACTION  2014   Patient Active Problem List   Diagnosis Date Noted   Chronic pain syndrome 08/17/2023   S/P total knee replacement, left 07/28/2023   Rosacea 02/02/2023   Seasonal allergies 08/26/2022   Rheumatoid arthritis of multiple sites with negative rheumatoid factor (HCC) 07/22/2022    PCP: Lendia Boby CROME, NP-C  REFERRING PROVIDER: Addie Cordella Hamilton, MD  REFERRING DIAG: S/P total knee arthroplasty, left [S03.347]   Rationale for Evaluation and Treatment: Rehabilitation  THERAPY DIAG:  No diagnosis found.  PERTINENT HISTORY: RA, chronic pain syndrome, L TKA 07/28/2023  WEIGHT BEARING RESTRICTIONS: Yes WBAT  FALLS:  Has patient fallen in last 6 months? No  LIVING ENVIRONMENT: Lives with: lives with their spouse Lives in: House/apartment Stairs: No Has following equipment at home: Single point cane and Environmental consultant - 2  wheeled  OCCUPATION: property management   PRECAUTIONS: None ---------------------------------------------------------------------------------------------  SUBJECTIVE:   SUBJECTIVE STATEMENT: Knee    Eval statement 08/18/2023: was dx with juvenile rheumatoid arthritis. L TKA on 07/28/2023. Struggles with stairs, SLR, and prolonged ambulation. Currently 6/10 pain  RED FLAGS: None   PLOF: Independent  PATIENT GOALS: fix the knee.  NEXT MD VISIT: June 6th ---------------------------------------------------------------------------------------------  OBJECTIVE:  Note: Objective measures were completed at Evaluation unless otherwise noted.  DIAGNOSTIC FINDINGS:  IMPRESSION: Degenerative arthrosis predominantly medial compartmental chondromalacia. There is slight blunting of the medial meniscus without discrete meniscal tear. Small reactive joint effusion.   No erosive changes or evidence of synovitis.   Electronically signed by: Norleen Satchel MD 07/14/2023 02:24 PM EDT RP Workstation: [id number]    AP lateral merchant radiographs left knee reviewed.  No acute fracture.   Medial and lateral joint spaces reasonably well-maintained.  Moderate  patellofemoral arthritis.  Alignment intact.   PATIENT SURVEYS:  LEFS :0/80  COGNITION: Overall cognitive status: Within functional limits for tasks assessed     SENSATION: WFL  EDEMA:  Moderate-severe swelling around L knee  POSTURE: No Significant postural limitations  PALPATION: TTP around surgical site  LOWER EXTREMITY ROM:  Active ROM Right eval Left eval  Hip flexion [facility]   Hip extension    Hip abduction    Hip adduction    Hip internal rotation    Hip external rotation    Knee flexion  50  Knee extension  -  2  Ankle dorsiflexion    Ankle plantarflexion    Ankle inversion    Ankle eversion     (Blank rows = not tested)  ! Indicates pain with testing  LOWER EXTREMITY MMT: deferred d/t recent  surgery  MMT Right eval Left eval  Hip flexion    Hip extension    Hip abduction    Hip adduction    Hip internal rotation    Hip external rotation    Knee flexion    Knee extension    Ankle dorsiflexion    Ankle plantarflexion    Ankle inversion    Ankle eversion     (Blank rows = not tested)  ! Indicates pain with testing  LOWER EXTREMITY SPECIAL TESTS:  deferred d/t recent surgery   GAIT: Distance walked: 248ft Assistive device utilized: None Level of assistance: Complete Independence Comments: pt displays appropriate knee extension during stance phase of LLE  TREATMENT:  [facility] Adult PT Treatment:                                                DATE: 09/20/2023  Therapeutic Exercise: Prone knee bend w/ strap Manual Therapy: *** Neuromuscular re-ed: *** Therapeutic Activity: Rec Bike 8'  for ROM and activity tolerance Leg press 2x20, hold 3s Pad back to promote more knee flexion Modalities: *** Self Care: ***    [facility] Adult PT Treatment:                                                DATE: 09/15/23 Therapeutic Exercise: Nustep L2 8 min 80 SPM Heel raises over 4 in step 15x Seated ham curls GTB 15x2 Neuromuscular re-ed: Sidestep with GREEN power strap 3 trips Runners step 8 in 15/15 5# KB Therapeutic Activity: Heel slides with strap 15x2 95d  L knee flexion over 12 in step 15x  FAQs w/adduction 15x2  [facility] Adult PT Treatment:                                                DATE: 09/13/23 Therapeutic Exercise: Heel slides with strap 15x2 94d (scar mobs b/t sets) posterior tibial mobs 10x10 Heel raises over 4 in step 15x Seated ham curls GTB 15x2 Neuromuscular re-ed: Sidestep with GREEN power strap 3 trips Runners step 8 in 15/15 5# KB Therapeutic Activity: Heel slides with strap 15x2 85d (scar mobs b/t sets) posterior tibial mobs 10x10 L knee flexion over 10 in step 15x 80d FAQs w/adduction 15x2  [facility] Adult PT Treatment:                                                 DATE: 09/07/23 Therapeutic Exercise: Heel slides with strap 15x2 90d (scar mobs b/t sets) posterior tibial mobs 10x10 Heel raises over 4 in step 15x Seated ham curls GTB 15x2 Neuromuscular re-ed: Sidestep with RED power strap 3 trips Runners step 8 in 10/10 Therapeutic Activity: Heel slides with strap  15x2 85d (scar mobs b/t sets) posterior tibial mobs 10x10 L knee flexion over 10 in step 15x 80d Heel raises over 6 in step 15x Seated ham curls GTB 15x2  [facility] Adult PT Treatment:                                                DATE: 09/06/23 Therapeutic Exercise: Nustep L4 60-80 SPM 8 min Gastroc/soleus stretch 30s x2 ea. Neuromuscular re-ed: FAQs B w/adduction 15x2 Sidestep with RED power strap 3 trips Runners step 6 in 10/10 Therapeutic Activity: Heel slides with strap 15x2 85d (scar mobs b/t sets) posterior tibial mobs 10x10 L knee flexion over 10 in step 15x 80d Heel raises over 6 in step 15x Seated ham curls GTB 15x2  [facility] Adult PT Treatment:                                                DATE: 09/01/23 Therapeutic Exercise: Nustep L4 50-60 SPM 8 min Gastroc stretch 30s x2 Neuromuscular re-ed: FAQs B w/adduction 15x2 Sidestep with RED power strap 3 trips Runners step 4 in 10/10 Therapeutic Activity: Heel slides with strap 15x2 76d flexion (scar mobs b/t sets) L knee flexion over 10 in step 15x 80d Heel raises over 4 in step 15x Seated ham curls RTB 15x2     PATIENT EDUCATION:  Education details: Pt received education regarding HEP performance, ADL performance, functional activity tolerance, impairment education, appropriate performance of therapeutic activities. Swelling management Person educated: Patient Education method: Explanation, Demonstration, Tactile cues, Verbal cues, and Handouts Education comprehension: verbalized understanding and returned demonstration  HOME EXERCISE PROGRAM: Access Code: [email or web address] URL:  [email or web address] Date: 08/18/2023 Prepared by: Mabel Kiang  Exercises - Seated Knee Flexion AAROM  - 3 x daily - 7 x weekly - 2-3 sets - 1 reps - 37m hold - Active Straight Leg Raise with Quad Set  - 1 x daily - 7 x weekly - 3 sets - 10 reps - Supine Quad Set  - 1-3 x daily - 7 x weekly - 2-3 sets - 10 reps - 10s hold - Supine Isometric Hamstring Set  - 1 x daily - 7 x weekly - 2-3 sets - 10 reps - 10s hold ---------------------------------------------------------------------------------------------  ASSESSMENT:  CLINICAL IMPRESSION:  Pt attended physical therapy session for continuation of treatment regarding ***. Today's treatment focused on improvement of  ***. Pt is *** Pt showed *** tolerance to administered treatment with *** adverse effects by the end of session. Skilled intervention was utilized via activity modification for pt tolerance with task completion, functional progression/regression promoting best outcomes inline with current rehab goals, as well as *** verbal/tactile cuing alongside *** physical assistance for safe and appropriate performance of today's activities. Continue with therapeutic focus on ***.    Arrived with knee stiff/sore from training at work.  Able to obtain 94d flexion.  Increased weight on runners step exercise and added hip flexor/quad stretch on L. ***   Eval impression (08/18/2023): Pt. attended today's physical therapy session for evaluation of L TKA. Pt has complaints of 6/10 pain with difficulty with stairs, SLR, and prolonged ambulation/activity. Pt has notable deficits with quadriceps recruitment, L knee A/PROM and swelling. Pt would benefit from therapeutic  focus on quad recruitment, hip/knee stability, A/PROM, and swelling management.  Treatment performed today focused on pt education detailed in obj. Pt demonstrated great understanding of education provided. required minimal verbal/tactile cues and no physical assistance for  appropriate performance with today's activities. Pt requires the intervention of skilled outpatient physical therapy to address the aforementioned deficits and progress towards a functional level in line with therapeutic goals.   OBJECTIVE IMPAIRMENTS: Abnormal gait, decreased activity tolerance, decreased knowledge of use of DME, decreased mobility, difficulty walking, decreased ROM, decreased strength, hypomobility, increased edema, improper body mechanics, and pain.   ACTIVITY LIMITATIONS: sitting, standing, squatting, stairs, transfers, and locomotion level  PARTICIPATION LIMITATIONS: cleaning, laundry, driving, community activity, and occupation  PERSONAL FACTORS: Fitness, Profession, Time since onset of injury/illness/exacerbation, and 1 comorbidity: JRA are also affecting patient's functional outcome.   REHAB POTENTIAL: Good  CLINICAL DECISION MAKING: Stable/uncomplicated  EVALUATION COMPLEXITY: Low   GOALS: Goals reviewed with patient? YES  SHORT TERM GOALS: Target date: 09/15/2023 Pt will be independent with administered HEP to demonstrate the competency necessary for long term managemnet of symptoms at home.  Baseline: [email or web address] Goal status: INITIAL   LONG TERM GOALS: Target date: 10/13/2023   Pt. Will achieve a LEFS score of 60/80 as to demonstrate improvement in self-perceived functional ability with daily activities.  Baseline: 0 Goal status: INITIAL  2.  Pt will improve Global Hip/knee strength to a 4+/5 to demonstrate improvement in strength for quality of motion and activity performance. Baseline:  Goal status: INITIAL  3.  Pt will report pain levels improving during ADLs to be less than or equal to 2/10 as to demonstrate improved tolerance with daily functional activities such as work and cleaning.  Baseline:  Goal status: INITIAL  4.  Pt will report the ability to stand for >/= 90 minutes as to demonstrate improved tolerance to standing for prolonged time  and improved ability to participate in work.  Baseline:  Goal status: INITIAL  5.   Pt will independently ambulate 1074ft with no AD and less than 2/10 pain to demonstrate improved weightbearing tolerance, BLE strength, and functional capacity for community ambulation.  Baseline:  Goal status: INITIAL --------------------------------------------------------------------------------------------- PLAN:  PT FREQUENCY: 2x/week  PT DURATION: 8 weeks  PLANNED INTERVENTIONS: 97110-Therapeutic exercises, 97530- Therapeutic activity, [id number]- Neuromuscular re-education, 97535- Self Care, [id number]- Manual therapy, [id number]- Gait training, [phone]- Electrical stimulation (manual), [id number]- Vasopneumatic device, 97597- Wound care (first 20 sq cm), Patient/Family education, Balance training, Stair training, Taping, Dry Needling, Joint mobilization, Scar mobilization, Compression bandaging, DME instructions, and Cryotherapy  PLAN FOR NEXT SESSION: continue to progress as tolerated   Jeff Ziemba PT  09/20/2023, 10:36 AM

## 2023-09-24 ENCOUNTER — Other Ambulatory Visit: Payer: Self-pay | Admitting: Rheumatology

## 2023-09-24 DIAGNOSIS — Z79899 Other long term (current) drug therapy: Secondary | ICD-10-CM

## 2023-09-24 DIAGNOSIS — M0609 Rheumatoid arthritis without rheumatoid factor, multiple sites: Secondary | ICD-10-CM

## 2023-09-26 ENCOUNTER — Ambulatory Visit

## 2023-09-26 NOTE — Telephone Encounter (Signed)
 Last Fill: 08/26/2022  Next Visit: 10/12/2023  Last Visit: 07/13/2023  Dx: Rheumatoid arthritis of multiple sites with negative rheumatoid factor   Current Dose per office note on 07/13/2023: folic acid  2 mg daily.   Okay to refill Folic Acid ?

## 2023-09-27 NOTE — Therapy (Deleted)
 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   left eye, dx by opthalmology   Degenerative joint disease    OCD (obsessive compulsive disorder) 2025   per patient   PTSD (post-traumatic stress disorder) 2025   per patient   Rheumatoid arthritis [facility])    Past Surgical History:  Procedure Laterality Date   CESAREAN SECTION     2007, 2012   EYE SURGERY Right    age 51, tighten muscle for lazy eye   FOOT SURGERY Left    hammertoe correction & screws placed (2 surgeries on left foot)   FOOT SURGERY Right 05/07/2022   hammertoe correction   TOTAL KNEE ARTHROPLASTY Left 07/28/2023   Procedure: ARTHROPLASTY, KNEE, TOTAL;  Surgeon: Addie Cordella Hamilton, MD;  Location: [facility] OR;  Service: Orthopedics;  Laterality: Left;   TUBAL LIGATION     WISDOM TOOTH EXTRACTION  2014   Patient Active Problem List   Diagnosis Date Noted   Chronic pain syndrome 08/17/2023   S/P total knee replacement, left 07/28/2023   Rosacea 02/02/2023   Seasonal allergies 08/26/2022   Rheumatoid arthritis of multiple sites with negative rheumatoid factor (HCC) 07/22/2022    PCP: Lendia Boby CROME, NP-C  REFERRING PROVIDER: Addie Cordella Hamilton, MD  REFERRING DIAG: S/P total knee arthroplasty, left [S03.347]   Rationale for Evaluation and Treatment: Rehabilitation  THERAPY DIAG:  No diagnosis found.  PERTINENT HISTORY: RA, chronic pain syndrome, L TKA 07/28/2023  WEIGHT BEARING RESTRICTIONS: Yes WBAT  FALLS:  Has patient fallen in last 6 months? No  LIVING ENVIRONMENT: Lives with: lives with their spouse Lives in: House/apartment Stairs: No Has following equipment at home: Single point cane and Environmental consultant - 2  wheeled  OCCUPATION: property management   PRECAUTIONS: None ---------------------------------------------------------------------------------------------  SUBJECTIVE:   SUBJECTIVE STATEMENT: Knee    Eval statement 08/18/2023: was dx with juvenile rheumatoid arthritis. L TKA on 07/28/2023. Struggles with stairs, SLR, and prolonged ambulation. Currently 6/10 pain  RED FLAGS: None   PLOF: Independent  PATIENT GOALS: fix the knee.  NEXT MD VISIT: June 6th ---------------------------------------------------------------------------------------------  OBJECTIVE:  Note: Objective measures were completed at Evaluation unless otherwise noted.  DIAGNOSTIC FINDINGS:  IMPRESSION: Degenerative arthrosis predominantly medial compartmental chondromalacia. There is slight blunting of the medial meniscus without discrete meniscal tear. Small reactive joint effusion.   No erosive changes or evidence of synovitis.   Electronically signed by: Norleen Satchel MD 07/14/2023 02:24 PM EDT RP Workstation: [id number]    AP lateral merchant radiographs left knee reviewed.  No acute fracture.   Medial and lateral joint spaces reasonably well-maintained.  Moderate  patellofemoral arthritis.  Alignment intact.   PATIENT SURVEYS:  LEFS :0/80  COGNITION: Overall cognitive status: Within functional limits for tasks assessed     SENSATION: WFL  EDEMA:  Moderate-severe swelling around L knee  POSTURE: No Significant postural limitations  PALPATION: TTP around surgical site  LOWER EXTREMITY ROM:  Active ROM Right eval Left eval  Hip flexion [facility]   Hip extension    Hip abduction    Hip adduction    Hip internal rotation    Hip external rotation    Knee flexion  50  Knee extension  -  2  Ankle dorsiflexion    Ankle plantarflexion    Ankle inversion    Ankle eversion     (Blank rows = not tested)  ! Indicates pain with testing  LOWER EXTREMITY MMT: deferred d/t recent  surgery  MMT Right eval Left eval  Hip flexion    Hip extension    Hip abduction    Hip adduction    Hip internal rotation    Hip external rotation    Knee flexion    Knee extension    Ankle dorsiflexion    Ankle plantarflexion    Ankle inversion    Ankle eversion     (Blank rows = not tested)  ! Indicates pain with testing  LOWER EXTREMITY SPECIAL TESTS:  deferred d/t recent surgery   GAIT: Distance walked: 256ft Assistive device utilized: None Level of assistance: Complete Independence Comments: pt displays appropriate knee extension during stance phase of LLE  TREATMENT:  [facility] Adult PT Treatment:                                                DATE: 09/20/2023  Therapeutic Exercise: Prone knee bend w/ strap Manual Therapy: *** Neuromuscular re-ed: *** Therapeutic Activity: Rec Bike 8'  for ROM and activity tolerance Leg press 2x20, hold 3s Pad back to promote more knee flexion Modalities: *** Self Care: ***    [facility] Adult PT Treatment:                                                DATE: 09/15/23 Therapeutic Exercise: Nustep L2 8 min 80 SPM Heel raises over 4 in step 15x Seated ham curls GTB 15x2 Neuromuscular re-ed: Sidestep with GREEN power strap 3 trips Runners step 8 in 15/15 5# KB Therapeutic Activity: Heel slides with strap 15x2 95d  L knee flexion over 12 in step 15x  FAQs w/adduction 15x2  [facility] Adult PT Treatment:                                                DATE: 09/13/23 Therapeutic Exercise: Heel slides with strap 15x2 94d (scar mobs b/t sets) posterior tibial mobs 10x10 Heel raises over 4 in step 15x Seated ham curls GTB 15x2 Neuromuscular re-ed: Sidestep with GREEN power strap 3 trips Runners step 8 in 15/15 5# KB Therapeutic Activity: Heel slides with strap 15x2 85d (scar mobs b/t sets) posterior tibial mobs 10x10 L knee flexion over 10 in step 15x 80d FAQs w/adduction 15x2  [facility] Adult PT Treatment:                                                 DATE: 09/07/23 Therapeutic Exercise: Heel slides with strap 15x2 90d (scar mobs b/t sets) posterior tibial mobs 10x10 Heel raises over 4 in step 15x Seated ham curls GTB 15x2 Neuromuscular re-ed: Sidestep with RED power strap 3 trips Runners step 8 in 10/10 Therapeutic Activity: Heel slides with strap  15x2 85d (scar mobs b/t sets) posterior tibial mobs 10x10 L knee flexion over 10 in step 15x 80d Heel raises over 6 in step 15x Seated ham curls GTB 15x2  [facility] Adult PT Treatment:                                                DATE: 09/06/23 Therapeutic Exercise: Nustep L4 60-80 SPM 8 min Gastroc/soleus stretch 30s x2 ea. Neuromuscular re-ed: FAQs B w/adduction 15x2 Sidestep with RED power strap 3 trips Runners step 6 in 10/10 Therapeutic Activity: Heel slides with strap 15x2 85d (scar mobs b/t sets) posterior tibial mobs 10x10 L knee flexion over 10 in step 15x 80d Heel raises over 6 in step 15x Seated ham curls GTB 15x2  [facility] Adult PT Treatment:                                                DATE: 09/01/23 Therapeutic Exercise: Nustep L4 50-60 SPM 8 min Gastroc stretch 30s x2 Neuromuscular re-ed: FAQs B w/adduction 15x2 Sidestep with RED power strap 3 trips Runners step 4 in 10/10 Therapeutic Activity: Heel slides with strap 15x2 76d flexion (scar mobs b/t sets) L knee flexion over 10 in step 15x 80d Heel raises over 4 in step 15x Seated ham curls RTB 15x2     PATIENT EDUCATION:  Education details: Pt received education regarding HEP performance, ADL performance, functional activity tolerance, impairment education, appropriate performance of therapeutic activities. Swelling management Person educated: Patient Education method: Explanation, Demonstration, Tactile cues, Verbal cues, and Handouts Education comprehension: verbalized understanding and returned demonstration  HOME EXERCISE PROGRAM: Access Code: [email or web address] URL:  [email or web address] Date: 08/18/2023 Prepared by: Mabel Kiang  Exercises - Seated Knee Flexion AAROM  - 3 x daily - 7 x weekly - 2-3 sets - 1 reps - 43m hold - Active Straight Leg Raise with Quad Set  - 1 x daily - 7 x weekly - 3 sets - 10 reps - Supine Quad Set  - 1-3 x daily - 7 x weekly - 2-3 sets - 10 reps - 10s hold - Supine Isometric Hamstring Set  - 1 x daily - 7 x weekly - 2-3 sets - 10 reps - 10s hold ---------------------------------------------------------------------------------------------  ASSESSMENT:  CLINICAL IMPRESSION:  Pt attended physical therapy session for continuation of treatment regarding ***. Today's treatment focused on improvement of  ***. Pt is *** Pt showed *** tolerance to administered treatment with *** adverse effects by the end of session. Skilled intervention was utilized via activity modification for pt tolerance with task completion, functional progression/regression promoting best outcomes inline with current rehab goals, as well as *** verbal/tactile cuing alongside *** physical assistance for safe and appropriate performance of today's activities. Continue with therapeutic focus on ***.    Arrived with knee stiff/sore from training at work.  Able to obtain 94d flexion.  Increased weight on runners step exercise and added hip flexor/quad stretch on L. ***   Eval impression (08/18/2023): Pt. attended today's physical therapy session for evaluation of L TKA. Pt has complaints of 6/10 pain with difficulty with stairs, SLR, and prolonged ambulation/activity. Pt has notable deficits with quadriceps recruitment, L knee A/PROM and swelling. Pt would benefit from therapeutic  focus on quad recruitment, hip/knee stability, A/PROM, and swelling management.  Treatment performed today focused on pt education detailed in obj. Pt demonstrated great understanding of education provided. required minimal verbal/tactile cues and no physical assistance for  appropriate performance with today's activities. Pt requires the intervention of skilled outpatient physical therapy to address the aforementioned deficits and progress towards a functional level in line with therapeutic goals.   OBJECTIVE IMPAIRMENTS: Abnormal gait, decreased activity tolerance, decreased knowledge of use of DME, decreased mobility, difficulty walking, decreased ROM, decreased strength, hypomobility, increased edema, improper body mechanics, and pain.   ACTIVITY LIMITATIONS: sitting, standing, squatting, stairs, transfers, and locomotion level  PARTICIPATION LIMITATIONS: cleaning, laundry, driving, community activity, and occupation  PERSONAL FACTORS: Fitness, Profession, Time since onset of injury/illness/exacerbation, and 1 comorbidity: JRA are also affecting patient's functional outcome.   REHAB POTENTIAL: Good  CLINICAL DECISION MAKING: Stable/uncomplicated  EVALUATION COMPLEXITY: Low   GOALS: Goals reviewed with patient? YES  SHORT TERM GOALS: Target date: 09/15/2023 Pt will be independent with administered HEP to demonstrate the competency necessary for long term managemnet of symptoms at home.  Baseline: [email or web address] Goal status: INITIAL   LONG TERM GOALS: Target date: 10/13/2023   Pt. Will achieve a LEFS score of 60/80 as to demonstrate improvement in self-perceived functional ability with daily activities.  Baseline: 0 Goal status: INITIAL  2.  Pt will improve Global Hip/knee strength to a 4+/5 to demonstrate improvement in strength for quality of motion and activity performance. Baseline:  Goal status: INITIAL  3.  Pt will report pain levels improving during ADLs to be less than or equal to 2/10 as to demonstrate improved tolerance with daily functional activities such as work and cleaning.  Baseline:  Goal status: INITIAL  4.  Pt will report the ability to stand for >/= 90 minutes as to demonstrate improved tolerance to standing for prolonged time  and improved ability to participate in work.  Baseline:  Goal status: INITIAL  5.   Pt will independently ambulate 1072ft with no AD and less than 2/10 pain to demonstrate improved weightbearing tolerance, BLE strength, and functional capacity for community ambulation.  Baseline:  Goal status: INITIAL --------------------------------------------------------------------------------------------- PLAN:  PT FREQUENCY: 2x/week  PT DURATION: 8 weeks  PLANNED INTERVENTIONS: 97110-Therapeutic exercises, 97530- Therapeutic activity, [id number]- Neuromuscular re-education, 97535- Self Care, [id number]- Manual therapy, [id number]- Gait training, [phone]- Electrical stimulation (manual), 97016- Vasopneumatic device, 97597- Wound care (first 20 sq cm), Patient/Family education, Balance training, Stair training, Taping, Dry Needling, Joint mobilization, Scar mobilization, Compression bandaging, DME instructions, and Cryotherapy  PLAN FOR NEXT SESSION: continue to progress as tolerated   Jeff Makhi Muzquiz PT  09/27/2023, 1:12 PM

## 2023-09-28 ENCOUNTER — Encounter: Payer: Self-pay | Admitting: Physical Medicine & Rehabilitation

## 2023-09-28 NOTE — Progress Notes (Unsigned)
 Office Visit Note  Patient: Erica Fox             Date of Birth: [birth year]           MRN: [id number]             PCP: Lendia Boby CROME, NP-C Referring: Lendia Boby CROME, NP-C Visit Date: 10/12/2023 Occupation: @GUAROCC @  Subjective:  Medication monitoring    History of Present Illness: Erica Fox is a 38 y.o. female with history seronegative rheumatoid arthritis.  Patient has resumed taking Rinvoq  15 mg 1 tablet by mouth daily on 10/10/23 and Rasuvo  25 mg sq injections weekly on 10/09/23.  Patient states that she experiences increased GI side effects on 10/10/2023 it was unsure if it was a medication side effect.  In the past she has tolerated Rinvoq  and Rasuvo .  Patient states that her GI side effects have resolved and have not recurred.  She had a gap in therapy for about 2 months due to undergoing a left knee replacement by Dr. Addie on 07/28/2023.  She has been taking tramadol  as needed for symptomatic relief.  She will be going to physical therapy and will be following back up with Dr. Addie on 10/24/2023.  She continues to have some soreness and stiffness in the right knee.  She is planning to proceed with arthroscopic surgery for the right knee ine future.  She denies any signs or symptoms of a flare while off of rinvoq  and Rasuvo .  She denies any joint swelling at this time.  She denies any new medical conditions.    Activities of Daily Living:  Patient reports joint stiffness all day  Patient Denies nocturnal pain.  Difficulty dressing/grooming: Reports Difficulty climbing stairs: Reports Difficulty getting out of chair: Denies Difficulty using hands for taps, buttons, cutlery, and/or writing: Reports  Review of Systems  Constitutional:  Positive for fatigue.  HENT:  Negative for mouth sores and mouth dryness.   Eyes:  Negative for dryness.  Respiratory:  Negative for shortness of breath.   Cardiovascular:  Negative for chest pain and palpitations.  Gastrointestinal:  Negative  for blood in stool, constipation and diarrhea.  Endocrine: Positive for increased urination.  Genitourinary:  Negative for involuntary urination.  Musculoskeletal:  Positive for joint pain, joint pain, joint swelling, morning stiffness and muscle tenderness. Negative for gait problem, myalgias, muscle weakness and myalgias.  Skin:  Negative for color change, rash, hair loss and sensitivity to sunlight.  Allergic/Immunologic: Negative for susceptible to infections.  Neurological:  Positive for headaches. Negative for dizziness.  Hematological:  Negative for swollen glands.  Psychiatric/Behavioral:  Negative for depressed mood and sleep disturbance. The patient is nervous/anxious.     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   left eye, dx by opthalmology   Degenerative joint disease    OCD (obsessive compulsive disorder) 2025   per patient   PTSD (post-traumatic stress disorder) 2025   per patient   Rheumatoid arthritis (HCC)     Family History  Problem Relation Age of Onset   Heart disease Mother  Breast cancer Paternal Aunt    Healthy Son    Healthy Son    Past Surgical History:  Procedure Laterality Date   CESAREAN SECTION     2007, 2012   EYE SURGERY Right    age 89, tighten muscle for lazy eye   FOOT SURGERY Left    hammertoe correction & screws placed (2 surgeries on left foot)   FOOT SURGERY Right 05/07/2022   hammertoe correction   TOTAL KNEE ARTHROPLASTY Left 07/28/2023   Procedure: ARTHROPLASTY, KNEE, TOTAL;  Surgeon: Addie Cordella Hamilton, MD;   Location: [facility] OR;  Service: Orthopedics;  Laterality: Left;   TUBAL LIGATION     WISDOM TOOTH EXTRACTION  2014   Social History   Social History Narrative   Not on file    There is no immunization history on file for this patient.   Objective: Vital Signs: BP 128/80 (BP Location: Left Arm, Patient Position: Sitting, Cuff Size: Normal)   Pulse 71   Resp 16   Ht 5' 8 (1.727 m)   Wt 260 lb (117.9 kg)   BMI 39.53 kg/m    Physical Exam Vitals and nursing note reviewed.  Constitutional:      Appearance: She is well-developed.  HENT:     Head: Normocephalic and atraumatic.  Eyes:     Conjunctiva/sclera: Conjunctivae normal.  Cardiovascular:     Rate and Rhythm: Normal rate and regular rhythm.     Heart sounds: Normal heart sounds.  Pulmonary:     Effort: Pulmonary effort is normal.     Breath sounds: Normal breath sounds.  Abdominal:     General: Bowel sounds are normal.     Palpations: Abdomen is soft.  Musculoskeletal:     Cervical back: Normal range of motion.  Lymphadenopathy:     Cervical: No cervical adenopathy.  Skin:    General: Skin is warm and dry.     Capillary Refill: Capillary refill takes less than 2 seconds.  Neurological:     Mental Status: She is alert and oriented to person, place, and time.  Psychiatric:        Behavior: Behavior normal.      Musculoskeletal Exam: C-spine has limited range of motion with lateral rotation.  Shoulder joints have good range of motion with no discomfort at this time.  Elbow joints have good range of motion with no tenderness along the joint line.  Limited range of motion of both wrist joints.  Synovial thickening of all MCP joints and PIP joints.  No synovitis noted.  Crepitus with range of motion of the right knee.  Left knee replacement has slightly limited flexion and extension.  Warmth of the left knee replacement noted.  Ankle joints have limited range of motion.  CDAI Exam: CDAI Score: -- Patient Global: --;  Provider Global: -- Swollen: --; Tender: -- Joint Exam 10/12/2023   No joint exam has been documented for this visit   There is currently no information documented on the homunculus. Go to the Rheumatology activity and complete the homunculus joint exam.  Investigation: No additional findings.  Imaging: No results found.  Recent Labs: Lab Results  Component Value Date   WBC 8.5 07/19/2023   HGB 12.3 07/19/2023   PLT 222 07/19/2023   NA 135 07/19/2023   K 4.0 07/19/2023   CL 105 07/19/2023   CO2 22 07/19/2023   GLUCOSE 87 07/19/2023   BUN 11 07/19/2023   CREATININE 0.64 07/19/2023   BILITOT 0.5 06/01/2023  ALKPHOS 66 03/11/2022   AST 23 06/01/2023   ALT 20 06/01/2023   PROT 7.6 06/01/2023   ALBUMIN 4.8 03/11/2022   CALCIUM 9.0 07/19/2023   QFTBGOLDPLUS NEGATIVE 07/22/2022    Speciality Comments: Enbrel-discontinued Humira-inadequate response Simponi  started October 05, 2022 Methotrexate  started August 26, 2022  Procedures:  No procedures performed Allergies: Dilaudid  [hydromorphone  hcl] and Penicillins   Assessment / Plan:     Visit Diagnoses: Rheumatoid arthritis of multiple sites with negative rheumatoid factor (HCC) - RF negative, anti-CCP negative, MCV negative, elevated sedimentation rate. JIA at age 1.  Severe erosive rheumatoid arthritis: She has no synovitis on examination today.  She has not had any signs or symptoms of a rheumatoid arthritis flare.  Patient underwent a left knee total arthroplasty on 07/28/2023 with no complications.  She remains under the care of Dr. Addie and will be going to physical therapy.  Her mobility has improved significantly since undergoing surgical intervention.  She is considering right knee arthroscopic surgery in the future.  Patient was off of Rasuvo  and written Volk for about 2 months around the time of surgery.  She resumed Rasuvo  on 10/09/23 and resumed Rinvoq  on 10/10/2023.  Patient was experiencing GI upset on 10/10/2023 which she  attributes to reintroducing these medications but since then the side effects have resolved and have not recurred.  Patient is willing to continue the current treatment regimen.  If she has a recurrence of GI side effects after her next dose of Rasuvo  we can consider reducing the dose.  She was advised to notify us  if she develops any new or worsening symptoms.  She will follow-up in the office in 3 months or sooner if needed.  High risk medication use - Rinvoq  15 mg 1 tablet by mouth daily, Rasuvo  25 mg sq injections once weekly, and folic acid  2 mg daily.Patient initiated Rinvoq  on 01/08/2023--In therapy for about 2 months due to undergoing a left knee replacement on 07/28/2023--resumed Rinvoq  on 10/10/2023. CBC and BMP updated on 07/19/23. Orders for CBC and CMP released today.  She will continue to require updated lab work every 3 months.  Standing orders for CBC and CMP remain in place. Lipid panel 03/03/23 TB gold negative 07/22/22.Order for TB gold released today.  No recent or recurrent infections.  Discussed the importance of holding Rinvoq  and Rasuvo  if she develops signs or symptoms of an infection and to resume once the infection has completely cleared.  - Plan: CBC with Differential/Platelet, Comprehensive metabolic panel with GFR  Screening for tuberculosis -Order for TB gold released today.  Plan: QuantiFERON-TB Gold Plus  Pain in both hands: No synovitis noted.  Primary osteoarthritis of right knee: Crepitus with range of motion.  Considering arthroscopic surgery in the future.  S/P total knee replacement, left: Performed by Dr. Addie on 07/28/2023.  No complications.  Minimal to no pain after surgery.  Warmth with slight limited flexion and extension.  Going to physical therapy.  Overall her mobility has improved significantly since undergoing surgical intervention.  Pain in both feet - Left achilles tendonitis-under the care of Dr. Silva.   JIA (juvenile idiopathic arthritis) (HCC) -  Diagnosed at age 31.  Treated with different DMARDs and Biologics until age 79.  She stopped methotrexate  and Enbrel at age 77  Other medical conditions are listed as follows:   Elevated sed rate  IgA deficiency (HCC)  Seasonal allergies  Other fatigue  Vitamin D  deficiency  PTSD (post-traumatic stress disorder)  History of ADHD  History of depression     Orders: Orders Placed This Encounter  Procedures   CBC with Differential/Platelet   Comprehensive metabolic panel with GFR   QuantiFERON-TB Gold Plus   No orders of the defined types were placed in this encounter.    Follow-Up Instructions: Return in about 3 months (around 01/12/2024) for Rheumatoid arthritis.   Waddell CHRISTELLA Craze, PA-C  Note - This record has been created using Dragon software.  Chart creation errors have been sought, but may not always  have been located. Such creation errors do not reflect on  the standard of medical care.

## 2023-09-29 ENCOUNTER — Ambulatory Visit: Payer: Self-pay

## 2023-10-03 ENCOUNTER — Ambulatory Visit

## 2023-10-04 NOTE — Therapy (Deleted)
 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   left eye, dx by opthalmology   Degenerative joint disease    OCD (obsessive compulsive disorder) 2025   per patient   PTSD (post-traumatic stress disorder) 2025   per patient   Rheumatoid arthritis [facility])    Past Surgical History:  Procedure Laterality Date   CESAREAN SECTION     2007, 2012   EYE SURGERY Right    age 50, tighten muscle for lazy eye   FOOT SURGERY Left    hammertoe correction & screws placed (2 surgeries on left foot)   FOOT SURGERY Right 05/07/2022   hammertoe correction   TOTAL KNEE ARTHROPLASTY Left 07/28/2023   Procedure: ARTHROPLASTY, KNEE, TOTAL;  Surgeon: Addie Cordella Hamilton, MD;  Location: [facility] OR;  Service: Orthopedics;  Laterality: Left;   TUBAL LIGATION     WISDOM TOOTH EXTRACTION  2014   Patient Active Problem List   Diagnosis Date Noted   Chronic pain syndrome 08/17/2023   S/P total knee replacement, left 07/28/2023   Rosacea 02/02/2023   Seasonal allergies 08/26/2022   Rheumatoid arthritis of multiple sites with negative rheumatoid factor (HCC) 07/22/2022    PCP: Lendia Boby CROME, NP-C  REFERRING PROVIDER: Addie Cordella Hamilton, MD  REFERRING DIAG: S/P total knee arthroplasty, left [S03.347]   Rationale for Evaluation and Treatment: Rehabilitation  THERAPY DIAG:  No diagnosis found.  PERTINENT HISTORY: RA, chronic pain syndrome, L TKA 07/28/2023  WEIGHT BEARING RESTRICTIONS: Yes WBAT  FALLS:  Has patient fallen in last 6 months? No  LIVING ENVIRONMENT: Lives with: lives with their spouse Lives in: House/apartment Stairs: No Has following equipment at home: Single point cane and Environmental consultant - 2  wheeled  OCCUPATION: property management   PRECAUTIONS: None ---------------------------------------------------------------------------------------------  SUBJECTIVE:   SUBJECTIVE STATEMENT: Knee    Eval statement 08/18/2023: was dx with juvenile rheumatoid arthritis. L TKA on 07/28/2023. Struggles with stairs, SLR, and prolonged ambulation. Currently 6/10 pain  RED FLAGS: None   PLOF: Independent  PATIENT GOALS: fix the knee.  NEXT MD VISIT: June 6th ---------------------------------------------------------------------------------------------  OBJECTIVE:  Note: Objective measures were completed at Evaluation unless otherwise noted.  DIAGNOSTIC FINDINGS:  IMPRESSION: Degenerative arthrosis predominantly medial compartmental chondromalacia. There is slight blunting of the medial meniscus without discrete meniscal tear. Small reactive joint effusion.   No erosive changes or evidence of synovitis.   Electronically signed by: Norleen Satchel MD 07/14/2023 02:24 PM EDT RP Workstation: [id number]    AP lateral merchant radiographs left knee reviewed.  No acute fracture.   Medial and lateral joint spaces reasonably well-maintained.  Moderate  patellofemoral arthritis.  Alignment intact.   PATIENT SURVEYS:  LEFS :0/80  COGNITION: Overall cognitive status: Within functional limits for tasks assessed     SENSATION: WFL  EDEMA:  Moderate-severe swelling around L knee  POSTURE: No Significant postural limitations  PALPATION: TTP around surgical site  LOWER EXTREMITY ROM:  Active ROM Right eval Left eval  Hip flexion [facility]   Hip extension    Hip abduction    Hip adduction    Hip internal rotation    Hip external rotation    Knee flexion  50  Knee extension  -  2  Ankle dorsiflexion    Ankle plantarflexion    Ankle inversion    Ankle eversion     (Blank rows = not tested)  ! Indicates pain with testing  LOWER EXTREMITY MMT: deferred d/t recent  surgery  MMT Right eval Left eval  Hip flexion    Hip extension    Hip abduction    Hip adduction    Hip internal rotation    Hip external rotation    Knee flexion    Knee extension    Ankle dorsiflexion    Ankle plantarflexion    Ankle inversion    Ankle eversion     (Blank rows = not tested)  ! Indicates pain with testing  LOWER EXTREMITY SPECIAL TESTS:  deferred d/t recent surgery   GAIT: Distance walked: 244ft Assistive device utilized: None Level of assistance: Complete Independence Comments: pt displays appropriate knee extension during stance phase of LLE  TREATMENT:  [facility] Adult PT Treatment:                                                DATE: 09/20/2023  Therapeutic Exercise: Prone knee bend w/ strap Manual Therapy: *** Neuromuscular re-ed: *** Therapeutic Activity: Rec Bike 8'  for ROM and activity tolerance Leg press 2x20, hold 3s Pad back to promote more knee flexion Modalities: *** Self Care: ***    [facility] Adult PT Treatment:                                                DATE: 09/15/23 Therapeutic Exercise: Nustep L2 8 min 80 SPM Heel raises over 4 in step 15x Seated ham curls GTB 15x2 Neuromuscular re-ed: Sidestep with GREEN power strap 3 trips Runners step 8 in 15/15 5# KB Therapeutic Activity: Heel slides with strap 15x2 95d  L knee flexion over 12 in step 15x  FAQs w/adduction 15x2  [facility] Adult PT Treatment:                                                DATE: 09/13/23 Therapeutic Exercise: Heel slides with strap 15x2 94d (scar mobs b/t sets) posterior tibial mobs 10x10 Heel raises over 4 in step 15x Seated ham curls GTB 15x2 Neuromuscular re-ed: Sidestep with GREEN power strap 3 trips Runners step 8 in 15/15 5# KB Therapeutic Activity: Heel slides with strap 15x2 85d (scar mobs b/t sets) posterior tibial mobs 10x10 L knee flexion over 10 in step 15x 80d FAQs w/adduction 15x2  [facility] Adult PT Treatment:                                                 DATE: 09/07/23 Therapeutic Exercise: Heel slides with strap 15x2 90d (scar mobs b/t sets) posterior tibial mobs 10x10 Heel raises over 4 in step 15x Seated ham curls GTB 15x2 Neuromuscular re-ed: Sidestep with RED power strap 3 trips Runners step 8 in 10/10 Therapeutic Activity: Heel slides with strap  15x2 85d (scar mobs b/t sets) posterior tibial mobs 10x10 L knee flexion over 10 in step 15x 80d Heel raises over 6 in step 15x Seated ham curls GTB 15x2  [facility] Adult PT Treatment:                                                DATE: 09/06/23 Therapeutic Exercise: Nustep L4 60-80 SPM 8 min Gastroc/soleus stretch 30s x2 ea. Neuromuscular re-ed: FAQs B w/adduction 15x2 Sidestep with RED power strap 3 trips Runners step 6 in 10/10 Therapeutic Activity: Heel slides with strap 15x2 85d (scar mobs b/t sets) posterior tibial mobs 10x10 L knee flexion over 10 in step 15x 80d Heel raises over 6 in step 15x Seated ham curls GTB 15x2  [facility] Adult PT Treatment:                                                DATE: 09/01/23 Therapeutic Exercise: Nustep L4 50-60 SPM 8 min Gastroc stretch 30s x2 Neuromuscular re-ed: FAQs B w/adduction 15x2 Sidestep with RED power strap 3 trips Runners step 4 in 10/10 Therapeutic Activity: Heel slides with strap 15x2 76d flexion (scar mobs b/t sets) L knee flexion over 10 in step 15x 80d Heel raises over 4 in step 15x Seated ham curls RTB 15x2     PATIENT EDUCATION:  Education details: Pt received education regarding HEP performance, ADL performance, functional activity tolerance, impairment education, appropriate performance of therapeutic activities. Swelling management Person educated: Patient Education method: Explanation, Demonstration, Tactile cues, Verbal cues, and Handouts Education comprehension: verbalized understanding and returned demonstration  HOME EXERCISE PROGRAM: Access Code: [email or web address] URL:  [email or web address] Date: 08/18/2023 Prepared by: Mabel Kiang  Exercises - Seated Knee Flexion AAROM  - 3 x daily - 7 x weekly - 2-3 sets - 1 reps - 76m hold - Active Straight Leg Raise with Quad Set  - 1 x daily - 7 x weekly - 3 sets - 10 reps - Supine Quad Set  - 1-3 x daily - 7 x weekly - 2-3 sets - 10 reps - 10s hold - Supine Isometric Hamstring Set  - 1 x daily - 7 x weekly - 2-3 sets - 10 reps - 10s hold ---------------------------------------------------------------------------------------------  ASSESSMENT:  CLINICAL IMPRESSION:  Pt attended physical therapy session for continuation of treatment regarding ***. Today's treatment focused on improvement of  ***. Pt is *** Pt showed *** tolerance to administered treatment with *** adverse effects by the end of session. Skilled intervention was utilized via activity modification for pt tolerance with task completion, functional progression/regression promoting best outcomes inline with current rehab goals, as well as *** verbal/tactile cuing alongside *** physical assistance for safe and appropriate performance of today's activities. Continue with therapeutic focus on ***.    Arrived with knee stiff/sore from training at work.  Able to obtain 94d flexion.  Increased weight on runners step exercise and added hip flexor/quad stretch on L. ***   Eval impression (08/18/2023): Pt. attended today's physical therapy session for evaluation of L TKA. Pt has complaints of 6/10 pain with difficulty with stairs, SLR, and prolonged ambulation/activity. Pt has notable deficits with quadriceps recruitment, L knee A/PROM and swelling. Pt would benefit from therapeutic  focus on quad recruitment, hip/knee stability, A/PROM, and swelling management.  Treatment performed today focused on pt education detailed in obj. Pt demonstrated great understanding of education provided. required minimal verbal/tactile cues and no physical assistance for  appropriate performance with today's activities. Pt requires the intervention of skilled outpatient physical therapy to address the aforementioned deficits and progress towards a functional level in line with therapeutic goals.   OBJECTIVE IMPAIRMENTS: Abnormal gait, decreased activity tolerance, decreased knowledge of use of DME, decreased mobility, difficulty walking, decreased ROM, decreased strength, hypomobility, increased edema, improper body mechanics, and pain.   ACTIVITY LIMITATIONS: sitting, standing, squatting, stairs, transfers, and locomotion level  PARTICIPATION LIMITATIONS: cleaning, laundry, driving, community activity, and occupation  PERSONAL FACTORS: Fitness, Profession, Time since onset of injury/illness/exacerbation, and 1 comorbidity: JRA are also affecting patient's functional outcome.   REHAB POTENTIAL: Good  CLINICAL DECISION MAKING: Stable/uncomplicated  EVALUATION COMPLEXITY: Low   GOALS: Goals reviewed with patient? YES  SHORT TERM GOALS: Target date: 09/15/2023 Pt will be independent with administered HEP to demonstrate the competency necessary for long term managemnet of symptoms at home.  Baseline: [email or web address] Goal status: INITIAL   LONG TERM GOALS: Target date: 10/13/2023   Pt. Will achieve a LEFS score of 60/80 as to demonstrate improvement in self-perceived functional ability with daily activities.  Baseline: 0 Goal status: INITIAL  2.  Pt will improve Global Hip/knee strength to a 4+/5 to demonstrate improvement in strength for quality of motion and activity performance. Baseline:  Goal status: INITIAL  3.  Pt will report pain levels improving during ADLs to be less than or equal to 2/10 as to demonstrate improved tolerance with daily functional activities such as work and cleaning.  Baseline:  Goal status: INITIAL  4.  Pt will report the ability to stand for >/= 90 minutes as to demonstrate improved tolerance to standing for prolonged time  and improved ability to participate in work.  Baseline:  Goal status: INITIAL  5.   Pt will independently ambulate 1091ft with no AD and less than 2/10 pain to demonstrate improved weightbearing tolerance, BLE strength, and functional capacity for community ambulation.  Baseline:  Goal status: INITIAL --------------------------------------------------------------------------------------------- PLAN:  PT FREQUENCY: 2x/week  PT DURATION: 8 weeks  PLANNED INTERVENTIONS: 97110-Therapeutic exercises, 97530- Therapeutic activity, [id number]- Neuromuscular re-education, 97535- Self Care, [id number]- Manual therapy, [id number]- Gait training, [phone]- Electrical stimulation (manual), 97016- Vasopneumatic device, 97597- Wound care (first 20 sq cm), Patient/Family education, Balance training, Stair training, Taping, Dry Needling, Joint mobilization, Scar mobilization, Compression bandaging, DME instructions, and Cryotherapy  PLAN FOR NEXT SESSION: continue to progress as tolerated   Jeff Aiyana Stegmann PT  10/04/2023, 5:48 PM

## 2023-10-06 ENCOUNTER — Other Ambulatory Visit: Payer: Self-pay | Admitting: Rheumatology

## 2023-10-06 ENCOUNTER — Ambulatory Visit: Payer: Self-pay

## 2023-10-06 DIAGNOSIS — Z79899 Other long term (current) drug therapy: Secondary | ICD-10-CM

## 2023-10-06 DIAGNOSIS — M0609 Rheumatoid arthritis without rheumatoid factor, multiple sites: Secondary | ICD-10-CM

## 2023-10-07 ENCOUNTER — Other Ambulatory Visit: Payer: Self-pay | Admitting: Physician Assistant

## 2023-10-07 NOTE — Telephone Encounter (Signed)
 Last Fill: 06/13/2023  Labs: 07/19/2023 WNL  TB Gold: 07/22/2022 Neg    Next Visit: 10/12/2023  Last Visit: 07/13/2023  DX: Rheumatoid arthritis of multiple sites with negative rheumatoid factor   Current Dose per office note 07/13/2023: Rinvoq  15 mg 1 tablet by mouth daily   Patient to update labs at upcoming appointment on 10/12/2023. Lab orders pended.   Okay to refill Rinvoq ?

## 2023-10-07 NOTE — Therapy (Deleted)
 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   left eye, dx by opthalmology   Degenerative joint disease    OCD (obsessive compulsive disorder) 2025   per patient   PTSD (post-traumatic stress disorder) 2025   per patient   Rheumatoid arthritis [facility])    Past Surgical History:  Procedure Laterality Date   CESAREAN SECTION     2007, 2012   EYE SURGERY Right    age 54, tighten muscle for lazy eye   FOOT SURGERY Left    hammertoe correction & screws placed (2 surgeries on left foot)   FOOT SURGERY Right 05/07/2022   hammertoe correction   TOTAL KNEE ARTHROPLASTY Left 07/28/2023   Procedure: ARTHROPLASTY, KNEE, TOTAL;  Surgeon: Addie Cordella [facility], MD;  Location: [facility] OR;  Service: Orthopedics;  Laterality: Left;   TUBAL LIGATION     WISDOM TOOTH EXTRACTION  2014   Patient Active Problem List   Diagnosis Date Noted   Chronic pain syndrome 08/17/2023   S/P total knee replacement, left 07/28/2023   Rosacea 02/02/2023   Seasonal allergies 08/26/2022   Rheumatoid arthritis of multiple sites with negative rheumatoid factor (HCC) 07/22/2022    PCP: Lendia Boby CROME, NP-C  REFERRING PROVIDER: Addie Cordella [facility], MD  REFERRING DIAG: S/P total knee arthroplasty, left [S03.347]   Rationale for Evaluation and Treatment: Rehabilitation  THERAPY DIAG:  No diagnosis found.  PERTINENT HISTORY: RA, chronic pain syndrome, L TKA 07/28/2023  WEIGHT BEARING RESTRICTIONS: Yes WBAT  FALLS:  Has patient fallen in last 6 months? No  LIVING ENVIRONMENT: Lives with: lives with their spouse Lives in: House/apartment Stairs: No Has following equipment at home: Single point cane and Environmental consultant - 2  wheeled  OCCUPATION: property management   PRECAUTIONS: None ---------------------------------------------------------------------------------------------  SUBJECTIVE:   SUBJECTIVE STATEMENT: Knee    Eval statement 08/18/2023: was dx with juvenile rheumatoid arthritis. L TKA on 07/28/2023. Struggles with stairs, SLR, and prolonged ambulation. Currently 6/10 pain  RED FLAGS: None   PLOF: Independent  PATIENT GOALS: fix the knee.  NEXT MD VISIT: June 6th ---------------------------------------------------------------------------------------------  OBJECTIVE:  Note: Objective measures were completed at Evaluation unless otherwise noted.  DIAGNOSTIC FINDINGS:  IMPRESSION: Degenerative arthrosis predominantly medial compartmental chondromalacia. There is slight blunting of the medial meniscus without discrete meniscal tear. Small reactive joint effusion.   No erosive changes or evidence of synovitis.   Electronically signed by: Norleen Satchel MD 07/14/2023 02:24 PM EDT RP Workstation: [id number]    AP lateral merchant radiographs left knee reviewed.  No acute fracture.   Medial and lateral joint spaces reasonably well-maintained.  Moderate  patellofemoral arthritis.  Alignment intact.   PATIENT SURVEYS:  LEFS :0/80  COGNITION: Overall cognitive status: Within functional limits for tasks assessed     SENSATION: WFL  EDEMA:  Moderate-severe swelling around L knee  POSTURE: No Significant postural limitations  PALPATION: TTP around surgical site  LOWER EXTREMITY ROM:  Active ROM Right eval Left eval  Hip flexion [facility]   Hip extension    Hip abduction    Hip adduction    Hip internal rotation    Hip external rotation    Knee flexion  50  Knee extension  -  2  Ankle dorsiflexion    Ankle plantarflexion    Ankle inversion    Ankle eversion     (Blank rows = not tested)  ! Indicates pain with testing  LOWER EXTREMITY MMT: deferred d/t recent  surgery  MMT Right eval Left eval  Hip flexion    Hip extension    Hip abduction    Hip adduction    Hip internal rotation    Hip external rotation    Knee flexion    Knee extension    Ankle dorsiflexion    Ankle plantarflexion    Ankle inversion    Ankle eversion     (Blank rows = not tested)  ! Indicates pain with testing  LOWER EXTREMITY SPECIAL TESTS:  deferred d/t recent surgery   GAIT: Distance walked: 252ft Assistive device utilized: None Level of assistance: Complete Independence Comments: pt displays appropriate knee extension during stance phase of LLE  TREATMENT:  [facility] Adult PT Treatment:                                                DATE: 09/20/2023  Therapeutic Exercise: Prone knee bend w/ strap Manual Therapy: *** Neuromuscular re-ed: *** Therapeutic Activity: Rec Bike 8'  for ROM and activity tolerance Leg press 2x20, hold 3s Pad back to promote more knee flexion Modalities: *** Self Care: ***    [facility] Adult PT Treatment:                                                DATE: 09/15/23 Therapeutic Exercise: Nustep L2 8 min 80 SPM Heel raises over 4 in step 15x Seated ham curls GTB 15x2 Neuromuscular re-ed: Sidestep with GREEN power strap 3 trips Runners step 8 in 15/15 5# KB Therapeutic Activity: Heel slides with strap 15x2 95d  L knee flexion over 12 in step 15x  FAQs w/adduction 15x2  [facility] Adult PT Treatment:                                                DATE: 09/13/23 Therapeutic Exercise: Heel slides with strap 15x2 94d (scar mobs b/t sets) posterior tibial mobs 10x10 Heel raises over 4 in step 15x Seated ham curls GTB 15x2 Neuromuscular re-ed: Sidestep with GREEN power strap 3 trips Runners step 8 in 15/15 5# KB Therapeutic Activity: Heel slides with strap 15x2 85d (scar mobs b/t sets) posterior tibial mobs 10x10 L knee flexion over 10 in step 15x 80d FAQs w/adduction 15x2  [facility] Adult PT Treatment:                                                 DATE: 09/07/23 Therapeutic Exercise: Heel slides with strap 15x2 90d (scar mobs b/t sets) posterior tibial mobs 10x10 Heel raises over 4 in step 15x Seated ham curls GTB 15x2 Neuromuscular re-ed: Sidestep with RED power strap 3 trips Runners step 8 in 10/10 Therapeutic Activity: Heel slides with strap  15x2 85d (scar mobs b/t sets) posterior tibial mobs 10x10 L knee flexion over 10 in step 15x 80d Heel raises over 6 in step 15x Seated ham curls GTB 15x2  [facility] Adult PT Treatment:                                                DATE: 09/06/23 Therapeutic Exercise: Nustep L4 60-80 SPM 8 min Gastroc/soleus stretch 30s x2 ea. Neuromuscular re-ed: FAQs B w/adduction 15x2 Sidestep with RED power strap 3 trips Runners step 6 in 10/10 Therapeutic Activity: Heel slides with strap 15x2 85d (scar mobs b/t sets) posterior tibial mobs 10x10 L knee flexion over 10 in step 15x 80d Heel raises over 6 in step 15x Seated ham curls GTB 15x2  [facility] Adult PT Treatment:                                                DATE: 09/01/23 Therapeutic Exercise: Nustep L4 50-60 SPM 8 min Gastroc stretch 30s x2 Neuromuscular re-ed: FAQs B w/adduction 15x2 Sidestep with RED power strap 3 trips Runners step 4 in 10/10 Therapeutic Activity: Heel slides with strap 15x2 76d flexion (scar mobs b/t sets) L knee flexion over 10 in step 15x 80d Heel raises over 4 in step 15x Seated ham curls RTB 15x2     PATIENT EDUCATION:  Education details: Pt received education regarding HEP performance, ADL performance, functional activity tolerance, impairment education, appropriate performance of therapeutic activities. Swelling management Person educated: Patient Education method: Explanation, Demonstration, Tactile cues, Verbal cues, and Handouts Education comprehension: verbalized understanding and returned demonstration  HOME EXERCISE PROGRAM: Access Code: [email or web address] URL:  [email or web address] Date: 08/18/2023 Prepared by: Mabel Kiang  Exercises - Seated Knee Flexion AAROM  - 3 x daily - 7 x weekly - 2-3 sets - 1 reps - 47m hold - Active Straight Leg Raise with Quad Set  - 1 x daily - 7 x weekly - 3 sets - 10 reps - Supine Quad Set  - 1-3 x daily - 7 x weekly - 2-3 sets - 10 reps - 10s hold - Supine Isometric Hamstring Set  - 1 x daily - 7 x weekly - 2-3 sets - 10 reps - 10s hold ---------------------------------------------------------------------------------------------  ASSESSMENT:  CLINICAL IMPRESSION:  Pt attended physical therapy session for continuation of treatment regarding ***. Today's treatment focused on improvement of  ***. Pt is *** Pt showed *** tolerance to administered treatment with *** adverse effects by the end of session. Skilled intervention was utilized via activity modification for pt tolerance with task completion, functional progression/regression promoting best outcomes inline with current rehab goals, as well as *** verbal/tactile cuing alongside *** physical assistance for safe and appropriate performance of today's activities. Continue with therapeutic focus on ***.    Arrived with knee stiff/sore from training at work.  Able to obtain 94d flexion.  Increased weight on runners step exercise and added hip flexor/quad stretch on L. ***   Eval impression (08/18/2023): Pt. attended today's physical therapy session for evaluation of L TKA. Pt has complaints of 6/10 pain with difficulty with stairs, SLR, and prolonged ambulation/activity. Pt has notable deficits with quadriceps recruitment, L knee A/PROM and swelling. Pt would benefit from therapeutic  focus on quad recruitment, hip/knee stability, A/PROM, and swelling management.  Treatment performed today focused on pt education detailed in obj. Pt demonstrated great understanding of education provided. required minimal verbal/tactile cues and no physical assistance for  appropriate performance with today's activities. Pt requires the intervention of skilled outpatient physical therapy to address the aforementioned deficits and progress towards a functional level in line with therapeutic goals.   OBJECTIVE IMPAIRMENTS: Abnormal gait, decreased activity tolerance, decreased knowledge of use of DME, decreased mobility, difficulty walking, decreased ROM, decreased strength, hypomobility, increased edema, improper body mechanics, and pain.   ACTIVITY LIMITATIONS: sitting, standing, squatting, stairs, transfers, and locomotion level  PARTICIPATION LIMITATIONS: cleaning, laundry, driving, community activity, and occupation  PERSONAL FACTORS: Fitness, Profession, Time since onset of injury/illness/exacerbation, and 1 comorbidity: JRA are also affecting patient's functional outcome.   REHAB POTENTIAL: Good  CLINICAL DECISION MAKING: Stable/uncomplicated  EVALUATION COMPLEXITY: Low   GOALS: Goals reviewed with patient? YES  SHORT TERM GOALS: Target date: 09/15/2023 Pt will be independent with administered HEP to demonstrate the competency necessary for long term managemnet of symptoms at home.  Baseline: [email or web address] Goal status: INITIAL   LONG TERM GOALS: Target date: 10/13/2023   Pt. Will achieve a LEFS score of 60/80 as to demonstrate improvement in self-perceived functional ability with daily activities.  Baseline: 0 Goal status: INITIAL  2.  Pt will improve Global Hip/knee strength to a 4+/5 to demonstrate improvement in strength for quality of motion and activity performance. Baseline:  Goal status: INITIAL  3.  Pt will report pain levels improving during ADLs to be less than or equal to 2/10 as to demonstrate improved tolerance with daily functional activities such as work and cleaning.  Baseline:  Goal status: INITIAL  4.  Pt will report the ability to stand for >/= 90 minutes as to demonstrate improved tolerance to standing for prolonged time  and improved ability to participate in work.  Baseline:  Goal status: INITIAL  5.   Pt will independently ambulate 1086ft with no AD and less than 2/10 pain to demonstrate improved weightbearing tolerance, BLE strength, and functional capacity for community ambulation.  Baseline:  Goal status: INITIAL --------------------------------------------------------------------------------------------- PLAN:  PT FREQUENCY: 2x/week  PT DURATION: 8 weeks  PLANNED INTERVENTIONS: 97110-Therapeutic exercises, 97530- Therapeutic activity, [id number]- Neuromuscular re-education, 97535- Self Care, 02859- Manual therapy, [id number]- Gait training, [phone]- Electrical stimulation (manual), 97016- Vasopneumatic device, 97597- Wound care (first 20 sq cm), Patient/Family education, Balance training, Stair training, Taping, Dry Needling, Joint mobilization, Scar mobilization, Compression bandaging, DME instructions, and Cryotherapy  PLAN FOR NEXT SESSION: continue to progress as tolerated   Jeff Soundra Lampley PT  10/07/2023, 9:22 AM

## 2023-10-10 ENCOUNTER — Encounter: Payer: Self-pay | Admitting: Orthopedic Surgery

## 2023-10-10 ENCOUNTER — Other Ambulatory Visit (HOSPITAL_COMMUNITY): Payer: Self-pay

## 2023-10-10 ENCOUNTER — Ambulatory Visit: Payer: Self-pay

## 2023-10-10 ENCOUNTER — Telehealth: Payer: Self-pay | Admitting: Pharmacist

## 2023-10-10 DIAGNOSIS — M0609 Rheumatoid arthritis without rheumatoid factor, multiple sites: Secondary | ICD-10-CM

## 2023-10-10 DIAGNOSIS — Z79899 Other long term (current) drug therapy: Secondary | ICD-10-CM

## 2023-10-10 NOTE — Telephone Encounter (Signed)
PUS order cancelled. Encounter closed. 

## 2023-10-10 NOTE — Telephone Encounter (Signed)
 No response from patient.   AEX scheduled 03/30/2024.   Routing to [place] to review and advise.

## 2023-10-10 NOTE — Telephone Encounter (Signed)
 Received fax from [facility] stating that her insurance coverage is not populating.  Submitted a Prior Authorization request to [employer] for RINVOQ  via CoverMyMeds. Will update once we receive a response.  Key: [id number]  Sherry Pennant, PharmD, MPH, BCPS, CPP Clinical Pharmacist (Rheumatology and Pulmonology)

## 2023-10-10 NOTE — Telephone Encounter (Signed)
 What side effects is she having? Does she want to discontinue rinvoq ?

## 2023-10-10 NOTE — Telephone Encounter (Signed)
 Please clarify if she took Rinvoq  with food?  Please clarify if she has had GI side effects with Rinvoq  in the past?  Did she also administer rasuvo  yet?   Any other medication changes?

## 2023-10-10 NOTE — Telephone Encounter (Signed)
 May close encounter, if she calls back before next annual we can reorder.

## 2023-10-11 ENCOUNTER — Other Ambulatory Visit (HOSPITAL_COMMUNITY): Payer: Self-pay

## 2023-10-11 NOTE — Telephone Encounter (Signed)
 Received notification from [employer] regarding a prior authorization for RINVOQ . Authorization has been APPROVED from 10/10/2023 to 10/09/2024. Approval letter sent to scan center.  Unable to run test claim because patient must fill through [facility] [phone])  Authorization # [id number]  Will send refill after labs from tomorrow result.

## 2023-10-12 ENCOUNTER — Encounter: Payer: Self-pay | Admitting: Physician Assistant

## 2023-10-12 ENCOUNTER — Ambulatory Visit: Attending: Physician Assistant | Admitting: Physician Assistant

## 2023-10-12 VITALS — BP 128/80 | HR 71 | Resp 16 | Ht 68.0 in | Wt 260.0 lb

## 2023-10-12 DIAGNOSIS — Z8659 Personal history of other mental and behavioral disorders: Secondary | ICD-10-CM

## 2023-10-12 DIAGNOSIS — R5383 Other fatigue: Secondary | ICD-10-CM

## 2023-10-12 DIAGNOSIS — M79672 Pain in left foot: Secondary | ICD-10-CM

## 2023-10-12 DIAGNOSIS — Z79899 Other long term (current) drug therapy: Secondary | ICD-10-CM

## 2023-10-12 DIAGNOSIS — M1711 Unilateral primary osteoarthritis, right knee: Secondary | ICD-10-CM

## 2023-10-12 DIAGNOSIS — Z96652 Presence of left artificial knee joint: Secondary | ICD-10-CM

## 2023-10-12 DIAGNOSIS — M79641 Pain in right hand: Secondary | ICD-10-CM | POA: Diagnosis not present

## 2023-10-12 DIAGNOSIS — F4310 Post-traumatic stress disorder, unspecified: Secondary | ICD-10-CM

## 2023-10-12 DIAGNOSIS — M0609 Rheumatoid arthritis without rheumatoid factor, multiple sites: Secondary | ICD-10-CM

## 2023-10-12 DIAGNOSIS — M25462 Effusion, left knee: Secondary | ICD-10-CM

## 2023-10-12 DIAGNOSIS — R700 Elevated erythrocyte sedimentation rate: Secondary | ICD-10-CM

## 2023-10-12 DIAGNOSIS — Z111 Encounter for screening for respiratory tuberculosis: Secondary | ICD-10-CM

## 2023-10-12 DIAGNOSIS — D802 Selective deficiency of immunoglobulin A [IgA]: Secondary | ICD-10-CM

## 2023-10-12 DIAGNOSIS — M79671 Pain in right foot: Secondary | ICD-10-CM

## 2023-10-12 DIAGNOSIS — M170 Bilateral primary osteoarthritis of knee: Secondary | ICD-10-CM

## 2023-10-12 DIAGNOSIS — J302 Other seasonal allergic rhinitis: Secondary | ICD-10-CM

## 2023-10-12 DIAGNOSIS — E559 Vitamin D deficiency, unspecified: Secondary | ICD-10-CM

## 2023-10-12 DIAGNOSIS — Z9225 Personal history of immunosupression therapy: Secondary | ICD-10-CM

## 2023-10-12 DIAGNOSIS — M0880 Other juvenile arthritis, unspecified site: Secondary | ICD-10-CM

## 2023-10-12 DIAGNOSIS — M79642 Pain in left hand: Secondary | ICD-10-CM

## 2023-10-13 ENCOUNTER — Ambulatory Visit: Payer: Self-pay | Admitting: Physician Assistant

## 2023-10-13 ENCOUNTER — Ambulatory Visit: Payer: Self-pay

## 2023-10-13 NOTE — Progress Notes (Signed)
 CBC WNL.  Total protein is borderline elevated. Rest of CMP WNL.  We will continue to monitor.

## 2023-10-15 LAB — CBC WITH DIFFERENTIAL/PLATELET
Absolute Lymphocytes: 3676 {cells}/uL (ref 850–3900)
Absolute Monocytes: 238 {cells}/uL (ref 200–950)
Basophils Absolute: 53 {cells}/uL (ref 0–200)
Basophils Relative: 0.8 %
Eosinophils Absolute: 152 {cells}/uL (ref 15–500)
Eosinophils Relative: 2.3 %
HCT: 41.8 % (ref 35.0–45.0)
Hemoglobin: 13.2 g/dL (ref 11.7–15.5)
MCH: 28.5 pg (ref 27.0–33.0)
MCHC: 31.6 g/dL — ABNORMAL LOW (ref 32.0–36.0)
MCV: 90.3 fL (ref 80.0–100.0)
MPV: 11 fL (ref 7.5–12.5)
Monocytes Relative: 3.6 %
Neutro Abs: 2482 {cells}/uL (ref 1500–7800)
Neutrophils Relative %: 37.6 %
Platelets: 223 Thousand/uL (ref 140–400)
RBC: 4.63 Million/uL (ref 3.80–5.10)
RDW: 12.9 % (ref 11.0–15.0)
Total Lymphocyte: 55.7 %
WBC: 6.6 Thousand/uL (ref 3.8–10.8)

## 2023-10-15 LAB — QUANTIFERON-TB GOLD PLUS
Mitogen-NIL: 8.53 [IU]/mL
NIL: 0.03 [IU]/mL
QuantiFERON-TB Gold Plus: NEGATIVE
TB1-NIL: 0.02 [IU]/mL
TB2-NIL: 0 [IU]/mL

## 2023-10-15 LAB — COMPREHENSIVE METABOLIC PANEL WITH GFR
AG Ratio: 1.3 (calc) (ref 1.0–2.5)
ALT: 15 U/L (ref 6–29)
AST: 19 U/L (ref 10–30)
Albumin: 4.7 g/dL (ref 3.6–5.1)
Alkaline phosphatase (APISO): 75 U/L (ref 31–125)
BUN: 8 mg/dL (ref 7–25)
CO2: 23 mmol/L (ref 20–32)
Calcium: 9.5 mg/dL (ref 8.6–10.2)
Chloride: 106 mmol/L (ref 98–110)
Creat: 0.78 mg/dL (ref 0.50–0.97)
Globulin: 3.5 g/dL (ref 1.9–3.7)
Glucose, Bld: 87 mg/dL (ref 65–99)
Potassium: 4 mmol/L (ref 3.5–5.3)
Sodium: 137 mmol/L (ref 135–146)
Total Bilirubin: 0.6 mg/dL (ref 0.2–1.2)
Total Protein: 8.2 g/dL — ABNORMAL HIGH (ref 6.1–8.1)
eGFR: 99 mL/min/1.73m2 (ref >=60)

## 2023-10-16 NOTE — Progress Notes (Signed)
 TB gold negative

## 2023-10-17 MED ORDER — RINVOQ 15 MG PO TB24: 1.0000 | ORAL_TABLET | Freq: Every day | ORAL | 0 refills | Status: AC

## 2023-10-17 NOTE — Telephone Encounter (Signed)
 Rx for Rinvoq  sent to [facility]  Patient advised via MyChart regarding pharmacy phone number and copay card information that she'll need to provide to pharmacy  Sherry Pennant, PharmD, MPH, BCPS, CPP Clinical Pharmacist (Rheumatology and Pulmonology)

## 2023-10-24 ENCOUNTER — Ambulatory Visit (INDEPENDENT_AMBULATORY_CARE_PROVIDER_SITE_OTHER): Admitting: Orthopedic Surgery

## 2023-10-24 DIAGNOSIS — Z96652 Presence of left artificial knee joint: Secondary | ICD-10-CM

## 2023-10-25 ENCOUNTER — Encounter: Payer: Self-pay | Admitting: Orthopedic Surgery

## 2023-10-25 NOTE — Progress Notes (Signed)
 Post-Op Visit Note   Patient: Erica Fox           Date of Birth: [birth year]           MRN: [id number] Visit Date: 10/24/2023 PCP: Lendia Boby CROME, NP-C   Assessment & Plan:  Chief Complaint:  Chief Complaint  Patient presents with   Left Knee - Routine Post Op      07/28/23 left TKA     Visit Diagnoses:  1. S/P total knee arthroplasty, left     Plan: Impression is patient is doing well following left total knee replacement.  She is gone with therapy to a range of motion of about 100-110.  Has full extension today.  She is asking about her right knee.  She does have meniscal pathology with some mild arthritis in that medial compartment.  MRI scan is reviewed today.  I think it would be best in this case to try arthroscopic debridement of that meniscal tear depending on its morphology.  Even if we can delay her right knee replacement by 2 to 3 years with an arthroscopic procedure I think it would be worthwhile.  If that does not help then we could proceed with knee replacement sometime next year.  Currently she is getting functional enough with that left leg but she could support herself with her activities of daily living for the brief period of partial weightbearing that would be required for right knee arthroscopy.  The risk and benefits of that are discussed with the patient including but limited to infection nerve vessel damage incomplete pain relief as well as potential need for further surgery.  Patient understands and wishes to proceed with right knee arthroscopic meniscal debridement.  All questions answered.  Follow-Up Instructions: No follow-ups on file.   Orders:  No orders of the defined types were placed in this encounter.  No orders of the defined types were placed in this encounter.   Imaging: No results found.  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   left eye, dx by opthalmology   Degenerative joint disease    OCD (obsessive compulsive disorder) 2025   per patient   PTSD (post-traumatic stress disorder) 2025   per patient   Rheumatoid arthritis (HCC)     Family History  Problem Relation Age of Onset   Heart disease Mother    Breast cancer Paternal Aunt    Healthy Son    Healthy Son     Past Surgical History:  Procedure Laterality Date   CESAREAN SECTION     2007, 2012   EYE SURGERY Right    age 55, tighten muscle for lazy eye   FOOT SURGERY Left    hammertoe correction & screws placed (2 surgeries on left foot)   FOOT SURGERY Right 05/07/2022   hammertoe correction   TOTAL KNEE ARTHROPLASTY Left 07/28/2023   Procedure: ARTHROPLASTY, KNEE, TOTAL;  Surgeon: Addie Cordella [facility], MD;  Location: [facility] OR;  Service: Orthopedics;  Laterality: Left;   TUBAL LIGATION     WISDOM TOOTH EXTRACTION  2014   Social  History   Occupational History   Not on file  Tobacco Use   Smoking status: Never    Passive exposure: Past   Smokeless tobacco: Never  Vaping Use   Vaping status: Never Used  Substance and Sexual Activity   Alcohol use: Yes    Comment: maybe a drink once every 2 months   Drug use: Not Currently   Sexual activity: Yes    Partners: Male    Birth control/protection: I.U.D., Surgical    Comment: tubal, Mirena , menarche 38yo, sexual debut 38yo

## 2023-10-26 ENCOUNTER — Other Ambulatory Visit: Payer: Self-pay

## 2023-10-26 DIAGNOSIS — Z96652 Presence of left artificial knee joint: Secondary | ICD-10-CM

## 2023-10-27 ENCOUNTER — Telehealth: Payer: Self-pay | Admitting: Orthopedic Surgery

## 2023-10-27 NOTE — Telephone Encounter (Signed)
 Called patient to offer surgery date for right knee arthroscopy with Dr Addie.  Left message on voicemail for patient to call back.  Provided name and direct number for scheduling.

## 2023-11-09 NOTE — Therapy (Signed)
 OUTPATIENT PHYSICAL THERAPY LOWER EXTREMITY EVALUATION   Patient Name: Erica Fox MRN: [id number] DOB:[birth year], 38 y.o., female Today's Date: 11/11/2023  END OF SESSION:  PT End of Session - 11/10/23 0802     Visit Number 1    Number of Visits 8    Date for PT Re-Evaluation 12/08/23    Authorization Type UHC    Authorization - Visit Number 9    Authorization - Number of Visits 60    PT Start Time 0802    PT Stop Time 0842    PT Time Calculation (min) 40 min    Activity Tolerance Patient tolerated treatment well    Behavior During Therapy [facility] for tasks assessed/performed          Past Medical History:  Diagnosis Date   ADHD (attention deficit hyperactivity disorder) 2025   per patient   Anemia    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   left eye, dx by opthalmology   Degenerative joint disease    OCD (obsessive compulsive disorder) 2025   per patient   PTSD (post-traumatic stress disorder) 2025   per patient   Rheumatoid arthritis [facility])    Past Surgical History:  Procedure Laterality Date   CESAREAN SECTION     2007, 2012   EYE SURGERY Right    age 22, tighten muscle for lazy eye   FOOT SURGERY Left    hammertoe correction & screws placed (2 surgeries on left foot)   FOOT SURGERY Right 05/07/2022   hammertoe correction   TOTAL KNEE ARTHROPLASTY Left 07/28/2023   Procedure: ARTHROPLASTY, KNEE, TOTAL;  Surgeon: Addie Cordella Hamilton, MD;  Location: [facility] OR;  Service: Orthopedics;  Laterality: Left;   TUBAL LIGATION     WISDOM TOOTH EXTRACTION  2014   Patient Active Problem List   Diagnosis Date Noted   Chronic pain syndrome 08/17/2023   S/P total knee replacement, left 07/28/2023   Rosacea 02/02/2023   Seasonal allergies 08/26/2022   Rheumatoid arthritis of multiple sites with negative rheumatoid factor (HCC) 07/22/2022    PCP: Lendia Boby CROME, NP-C  REFERRING PROVIDER: Addie Cordella Hamilton, MD  REFERRING DIAG: [phone] (ICD-10-CM) - S/P total knee  arthroplasty, left  THERAPY DIAG:  Acute pain of left knee - Plan: PT plan of care cert/re-cert  Stiffness of left knee, not elsewhere classified - Plan: PT plan of care cert/re-cert  Other abnormalities of gait and mobility - Plan: PT plan of care cert/re-cert  Rationale for Evaluation and Treatment: Rehabilitation  ONSET DATE: 07/28/23  SUBJECTIVE:   SUBJECTIVE STATEMENT: S/p left TKA 07/28/23 per Dr. Addie; started therapy in [place] at [facility].  Had to stop after 8 visit due to insurance lapse; now is on her husbands insurance and can resume PT.  Was supposed to home home health initially but they never showed up so started out 3 weeks behind.  Diagnosed with JRA at 57 or 38 years old.  Supposed to have right knee scoped 12/05/23  per Dr. Addie.     PERTINENT HISTORY: ADHD OCD PTSD Anemia RA Has a pain management MD PAIN:  Are you having pain? Yes: NPRS scale: 0 pain currently up to 1 or 2 max Pain location: left knee Pain description: stiff and uncomfortable Aggravating factors: prolonged walking, stiff in the mornings, prolonging walking Relieving factors: movement  PRECAUTIONS: None  RED FLAGS: None   WEIGHT BEARING RESTRICTIONS: No  FALLS:  Has patient fallen in last 6 months? 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  DIAGNOSTIC FINDINGS: had MRI of both knees in April; see under imaging  PATIENT SURVEYS:  LEFS  Extreme difficulty/unable (0), Quite a bit of difficulty (1), Moderate difficulty (2), Little difficulty (3), No difficulty (4) Survey date:    Any of your usual work, housework or school activities   2. Usual hobbies, recreational or sporting activities   3. Getting into/out of the bath   4. Walking  between rooms   5. Putting on socks/shoes   6. Squatting    7. Lifting an object, like a bag of groceries from the floor   8. Performing light activities around your home   9. Performing heavy activities around your home   10. Getting into/out of a car   11. Walking 2 blocks   12. Walking 1 mile   13. Going up/down 10 stairs (1 flight)   14. Standing for 1 hour   15.  sitting for 1 hour   16. Running on even ground   17. Running on uneven ground   18. Making sharp turns while running fast   19. Hopping    20. Rolling over in bed   Score total:  41/80; 51.2 %     COGNITION: Overall cognitive status: Within functional limits for tasks assessed     SENSATION: WFL  EDEMA:     PALPATION: General tenderness  LOWER EXTREMITY ROM:  Active ROM Right eval Left eval  Hip flexion    Hip extension    Hip abduction    Hip adduction    Hip internal rotation    Hip external rotation    Knee flexion 108 124  Knee extension -5 0  Ankle dorsiflexion    Ankle plantarflexion    Ankle inversion    Ankle eversion     (Blank rows = not tested)  LOWER EXTREMITY MMT:  MMT Right eval Left eval  Hip flexion 4+ 5  Hip extension    Hip abduction    Hip adduction    Hip internal rotation    Hip external rotation    Knee flexion    Knee extension 5 4+  Ankle dorsiflexion 5 5  Ankle plantarflexion    Ankle inversion    Ankle eversion     (Blank rows = not tested)   FUNCTIONAL TESTS:  5 times sit to stand: 14.37 sec 2 minute walk test: 465 ft without AD  GAIT: Distance walked: 465 ft no AD Assistive device utilized: None Level of assistance: Modified independence Comments: slight decreased gait speed  TREATMENT DATE:  11/10/23 physical therapy evaluation and HEP instruction  Bike seat 15 x 5' for mobility; full revolutions Heel raises x  10 Left Knee drives flexion on 6 step x 10 Updated HEP   PATIENT EDUCATION:  Education details: Patient educated on exam findings, POC, scope of PT, HEP, and what to expect next visit. Person educated: Patient Education method: Explanation, Demonstration, and Handouts Education comprehension: verbalized understanding, returned demonstration, verbal cues required, and tactile cues required  HOME EXERCISE PROGRAM: [email or web address]  Access Code: [email or web address] URL: [email or web address] Date: 11/10/2023 Prepared by: AP - Rehab  Exercises - Seated Knee Flexion AAROM  - 3 x daily - 7 x weekly - 2-3 sets - 1 reps - 64m hold - Active Straight Leg Raise with Quad Set  - 1 x daily - 7 x weekly - 3 sets - 10 reps - Supine Quad Set  - 1-3 x daily - 7 x weekly - 2-3 sets - 10 reps - 10s hold - Supine Isometric Hamstring Set  - 1 x daily - 7 x weekly - 2-3 sets - 10 reps - 10s hold - Supine Heel Slide  - 1 x daily - 7 x weekly - 1 sets - 10 reps - Heel Raises with Counter Support  - 1 x daily - 7 x weekly - 1 sets - 10 reps - Sit to Stand Without Arm Support  - 1 x daily - 7 x weekly - 1 sets - 10 reps - knee bends on the step using rails to hold onto  - 1 x daily - 7 x weekly - 1 sets - 10 reps  ASSESSMENT:  CLINICAL IMPRESSION: Patient is a 38 y.o. female who was seen today for physical therapy evaluation and treatment for Z96.652 (ICD-10-CM) - S/P total knee arthroplasty, left.  Patient demonstrates muscle weakness, reduced ROM, and fascial restrictions which are likely contributing to symptoms of pain and are negatively impacting patient ability to perform ADLs and functional mobility tasks. Patient will benefit from skilled physical therapy services to address these deficits to reduce pain and improve level of function with ADLs and functional mobility tasks.   OBJECTIVE IMPAIRMENTS: Abnormal gait, difficulty walking, decreased ROM, increased fascial restrictions, impaired perceived  functional ability, and pain.   ACTIVITY LIMITATIONS: bending, squatting, and locomotion level  PARTICIPATION LIMITATIONS: meal prep, cleaning, shopping, community activity, and occupation  PERSONAL FACTORS: RA are also affecting patient's functional outcome.   REHAB POTENTIAL: Good  CLINICAL DECISION MAKING: Evolving/moderate complexity  EVALUATION COMPLEXITY: Moderate   GOALS: Goals reviewed with patient? No  SHORT TERM GOALS: Target date: 11/24/2023 patient will be independent with initial HEP  Baseline: Goal status: INITIAL  2.  Patient will report 50% improvement overall  Baseline:  Goal status: INITIAL   LONG TERM GOALS: Target date: 12/08/2023  Patient will be independent in self management strategies to improve quality of life and functional outcomes.  Baseline:  Goal status: INITIAL  2.  Patient will report 70% improvement overall  Baseline:  Goal status: INITIAL  3.   Patient will increase left leg MMT's to 5/5 to allow navigation of steps without gait deviation or loss of balance  Baseline:  Goal status: INITIAL  4.  Patient will increase left knee mobility to -2 to 120 to promote normal navigation of steps; step over step pattern  Baseline:  Goal status: INITIAL  5.  Patient will improve LEFS score by 9 points to demonstrate improved perceived function  Baseline: 41/80 Goal status:  INITIAL  6.  Patient will increase her distance on 2 MWT by 35 ft to demonstrate improved functional mobility with community ambulation Baseline: 465 ft Goal status: INITIAL   PLAN:  PT FREQUENCY: 2x/week  PT DURATION: 4 weeks  PLANNED INTERVENTIONS: 97164- PT Re-evaluation, 97110-Therapeutic exercises, 97530- Therapeutic activity, 97112- Neuromuscular re-education, 97535- Self Care, 02859- Manual therapy, [id number]- Gait training, [phone]- Orthotic Fit/training, [phone]- Canalith repositioning, [id number]- Aquatic Therapy, 97760- Splinting, [phone]- Wound care (first 20 sq  cm), 97598- Wound care (each additional 20 sq cm)Patient/Family education, Balance training, Stair training, Taping, Dry Needling, Joint mobilization, Joint manipulation, Spinal manipulation, Spinal mobilization, Scar mobilization, and DME instructions.   PLAN FOR NEXT SESSION: Review HEP and goals; check Hamstring strength and SLS next visit; left knee mobility and strength; balance   9:42 AM, 11/11/23 Blondina Coderre Small Jawan Chavarria MPT [facility] [place] [phone] Ph:[phone]\

## 2023-11-10 ENCOUNTER — Ambulatory Visit (HOSPITAL_COMMUNITY): Attending: Orthopedic Surgery

## 2023-11-10 ENCOUNTER — Other Ambulatory Visit: Payer: Self-pay

## 2023-11-10 DIAGNOSIS — M25562 Pain in left knee: Secondary | ICD-10-CM | POA: Insufficient documentation

## 2023-11-10 DIAGNOSIS — M25662 Stiffness of left knee, not elsewhere classified: Secondary | ICD-10-CM | POA: Insufficient documentation

## 2023-11-10 DIAGNOSIS — Z96652 Presence of left artificial knee joint: Secondary | ICD-10-CM | POA: Insufficient documentation

## 2023-11-10 DIAGNOSIS — R2689 Other abnormalities of gait and mobility: Secondary | ICD-10-CM | POA: Diagnosis present

## 2023-11-15 ENCOUNTER — Ambulatory Visit (HOSPITAL_COMMUNITY): Admitting: Physical Therapy

## 2023-11-15 DIAGNOSIS — R2689 Other abnormalities of gait and mobility: Secondary | ICD-10-CM

## 2023-11-15 DIAGNOSIS — M25562 Pain in left knee: Secondary | ICD-10-CM | POA: Diagnosis not present

## 2023-11-15 DIAGNOSIS — M25662 Stiffness of left knee, not elsewhere classified: Secondary | ICD-10-CM

## 2023-11-15 NOTE — Therapy (Signed)
 OUTPATIENT PHYSICAL THERAPY LOWER EXTREMITY TREATMENT   Patient Name: Erica Fox MRN: [id number] DOB:08-04-1985, 38 y.o., female Today's Date: 11/15/2023  END OF SESSION:  PT End of Session - 11/15/23 0734     Visit Number 2    Number of Visits 8    Date for PT Re-Evaluation 12/08/23    Authorization Type UHC    Authorization - Number of Visits 60    PT Start Time 0719    PT Stop Time 0758    PT Time Calculation (min) 39 min    Activity Tolerance Patient tolerated treatment well    Behavior During Therapy [facility] for tasks assessed/performed           Past Medical History:  Diagnosis Date   ADHD (attention deficit hyperactivity disorder) 2025   per patient   Anemia    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   left eye, dx by opthalmology   Degenerative joint disease    OCD (obsessive compulsive disorder) 2025   per patient   PTSD (post-traumatic stress disorder) 2025   per patient   Rheumatoid arthritis [facility])    Past Surgical History:  Procedure Laterality Date   CESAREAN SECTION     2007, 2012   EYE SURGERY Right    age 64, tighten muscle for lazy eye   FOOT SURGERY Left    hammertoe correction & screws placed (2 surgeries on left foot)   FOOT SURGERY Right 05/07/2022   hammertoe correction   TOTAL KNEE ARTHROPLASTY Left 07/28/2023   Procedure: ARTHROPLASTY, KNEE, TOTAL;  Surgeon: Addie Cordella Hamilton, MD;  Location: [facility] OR;  Service: Orthopedics;  Laterality: Left;   TUBAL LIGATION     WISDOM TOOTH EXTRACTION  2014   Patient Active Problem List   Diagnosis Date Noted   Chronic pain syndrome 08/17/2023   S/P total knee replacement, left 07/28/2023   Rosacea 02/02/2023   Seasonal allergies 08/26/2022   Rheumatoid arthritis of multiple sites with negative rheumatoid factor (HCC) 07/22/2022    PCP: Lendia Boby CROME, NP-C  REFERRING PROVIDER: Addie Cordella Hamilton, MD  REFERRING DIAG: [id number] (ICD-10-CM) - S/P total knee arthroplasty, left  THERAPY DIAG:   Acute pain of left knee  Stiffness of left knee, not elsewhere classified  Other abnormalities of gait and mobility  Rationale for Evaluation and Treatment: Rehabilitation  ONSET DATE: 07/28/23  SUBJECTIVE:   SUBJECTIVE STATEMENT: Pt states her Lt knee is stiff this morning but having no pain.  No pain in Rt knee currently either.    Evaluation:  S/p left TKA 07/28/23 per Dr. Addie; started therapy in [place] at [facility].  Had to stop after 8 visit due to insurance lapse; now is on her husbands insurance and can resume PT.  Was supposed to home home health initially but they never showed up so started out 3 weeks behind.  Diagnosed with JRA at 59 or 38 years old.  Supposed to have right knee scoped 12/05/23  per Dr. Addie.     PERTINENT HISTORY: ADHD OCD PTSD Anemia RA Has a pain management MD PAIN:  Are you having pain? Yes: NPRS scale: 0 pain currently up to 1 or 2 max Pain location: left knee Pain description: stiff and uncomfortable Aggravating factors: prolonged walking, stiff in the mornings, prolonging walking Relieving factors: movement  PRECAUTIONS: None  RED FLAGS: None   WEIGHT BEARING RESTRICTIONS: No  FALLS:  Has patient fallen in last 6 months? 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  DIAGNOSTIC FINDINGS: had MRI of both knees in April; see under imaging  PATIENT SURVEYS:  LEFS  Extreme difficulty/unable (0), Quite a bit of difficulty (1), Moderate difficulty (2), Little difficulty (3), No difficulty (4) Survey date:    Any of your usual work, housework or school activities   2. Usual hobbies, recreational or sporting activities   3. Getting into/out of the bath   4. Walking  between rooms   5. Putting on socks/shoes   6. Squatting    7. Lifting an object, like a bag of groceries from the floor   8. Performing light activities around your home   9. Performing heavy activities around your home   10. Getting into/out of a car   11. Walking 2 blocks   12. Walking 1 mile   13. Going up/down 10 stairs (1 flight)   14. Standing for 1 hour   15.  sitting for 1 hour   16. Running on even ground   17. Running on uneven ground   18. Making sharp turns while running fast   19. Hopping    20. Rolling over in bed   Score total:  41/80; 51.2 %     COGNITION: Overall cognitive status: Within functional limits for tasks assessed     SENSATION: WFL  EDEMA:     PALPATION: General tenderness  LOWER EXTREMITY ROM:  Active ROM Right eval Left eval  Hip flexion    Hip extension    Hip abduction    Hip adduction    Hip internal rotation    Hip external rotation    Knee flexion 124 108  Knee extension 0 -5  Ankle dorsiflexion    Ankle plantarflexion    Ankle inversion    Ankle eversion     (Blank rows = not tested)  LOWER EXTREMITY MMT:  MMT Right eval Left eval  Hip flexion 4+ 5  Hip extension 4+ 5  Hip abduction 5 5  Hip adduction    Hip internal rotation    Hip external rotation    Knee flexion 5 4+ in avail ROM in prone  Knee extension 5 4+  Ankle dorsiflexion 5 5  Ankle plantarflexion    Ankle inversion    Ankle eversion     (Blank rows = not tested)   FUNCTIONAL TESTS:  5 times sit to stand: 14.37 sec 2 minute walk test: 465 ft without AD 11/15/23; SLS:  Rt: 14, Lt 8 max of 2 tries  GAIT: Distance walked: 465 ft no AD Assistive device utilized: None Level of assistance: Modified independence Comments: slight decreased gait speed  TREATMENT DATE:  11/15/23 Goal review  Bike seat 15 x 5' for  mobility; full revolutions Standing:  Left Knee drives flexion on 12 step x 10X10 Lt hamstring stretch 3X30 onto 12 step Lt knee flexion 10X SLS:  Rt: 14, Lt 8 max of 2 tries Prone hamstring and hip extensor MMT  Knee flexion Lt 10X Supine:  Lt SLR 10X  Heelslides Lt 10X  AROM -2 to 105 (110 PROM)   11/10/23 physical therapy evaluation and HEP instruction  Bike seat 15 x 5' for mobility; full revolutions Heel raises x 10 Left Knee drives flexion on 6 step x 10 Updated HEP   PATIENT EDUCATION:  Education details: Patient educated on exam findings, POC, scope of PT, HEP, and what to expect next visit. Person educated: Patient Education method: Explanation, Demonstration, and Handouts Education comprehension: verbalized understanding, returned demonstration, verbal cues required, and tactile cues required  HOME EXERCISE PROGRAM: [email or web address]  Access Code: [email or web address] URL: [email or web address] Date: 11/10/2023 Prepared by: AP - Rehab  Exercises - Seated Knee Flexion AAROM  - 3 x daily - 7 x weekly - 2-3 sets - 1 reps - 55m hold - Active Straight Leg Raise with Quad Set  - 1 x daily - 7 x weekly - 3 sets - 10 reps - Supine Quad Set  - 1-3 x daily - 7 x weekly - 2-3 sets - 10 reps - 10s hold - Supine Isometric Hamstring Set  - 1 x daily - 7 x weekly - 2-3 sets - 10 reps - 10s hold - Supine Heel Slide  - 1 x daily - 7 x weekly - 1 sets - 10 reps - Heel Raises with Counter Support  - 1 x daily - 7 x weekly - 1 sets - 10 reps - Sit to Stand Without Arm Support  - 1 x daily - 7 x weekly - 1 sets - 10 reps - knee bends on the step using rails to hold onto  - 1 x daily - 7 x weekly - 1 sets - 10 reps  ASSESSMENT:  CLINICAL IMPRESSION: Reviewed goals and POC moving forward.  SLS ability tested with max of 14 sec Rt and 8 sec Lt before having to use her UE to re-establish.  Pt able to complete all added therex today without pain or issues.  MMT completed for remaining hip  mm with normal strength obtained.  Only slight weakness in Lt hamstring.  Manual completed for overall tightness perimeter of knee.  Most tightness superiorly.  Improved AROM today -2 to 105 with ability to obtain 110 degrees passively.  Patient will continue to benefit from skilled physical therapy services to address these deficits to reduce pain and improve level of function with ADLs and functional mobility tasks.   OBJECTIVE IMPAIRMENTS: Abnormal gait, difficulty walking, decreased ROM, increased fascial restrictions, impaired perceived functional ability, and pain.   ACTIVITY LIMITATIONS: bending, squatting, and locomotion level  PARTICIPATION LIMITATIONS: meal prep, cleaning, shopping, community activity, and occupation  PERSONAL FACTORS: RA are also affecting patient's functional outcome.   REHAB POTENTIAL: Good  CLINICAL DECISION MAKING: Evolving/moderate complexity  EVALUATION COMPLEXITY: Moderate   GOALS: Goals reviewed with patient? Yes  SHORT TERM GOALS: Target date: 11/24/2023 patient will be independent with initial HEP  Baseline: Goal status: INITIAL  2.  Patient will report 50% improvement overall  Baseline:  Goal status: INITIAL   LONG TERM GOALS: Target date: 12/08/2023  Patient will be independent in self management strategies to improve  quality of life and functional outcomes.  Baseline:  Goal status: INITIAL  2.  Patient will report 70% improvement overall  Baseline:  Goal status: INITIAL  3.   Patient will increase left leg MMT's to 5/5 to allow navigation of steps without gait deviation or loss of balance  Baseline:  Goal status: INITIAL  4.  Patient will increase left knee mobility to -2 to 120 to promote normal navigation of steps; step over step pattern  Baseline:  Goal status: INITIAL  5.  Patient will improve LEFS score by 9 points to demonstrate improved perceived function  Baseline: 41/80 Goal status: INITIAL  6.  Patient will  increase her distance on 2 MWT by 35 ft to demonstrate improved functional mobility with community ambulation Baseline: 465 ft Goal status: INITIAL   PLAN:  PT FREQUENCY: 2x/week  PT DURATION: 4 weeks  PLANNED INTERVENTIONS: 97164- PT Re-evaluation, 97110-Therapeutic exercises, 97530- Therapeutic activity, 97112- Neuromuscular re-education, 97535- Self Care, 02859- Manual therapy, [id number]- Gait training, [phone]- Orthotic Fit/training, [phone]- Canalith repositioning, [id number]- Aquatic Therapy, 97760- Splinting, [phone]- Wound care (first 20 sq cm), 97598- Wound care (each additional 20 sq cm)Patient/Family education, Balance training, Stair training, Taping, Dry Needling, Joint mobilization, Joint manipulation, Spinal manipulation, Spinal mobilization, Scar mobilization, and DME instructions.   PLAN FOR NEXT SESSION: continue to progress left knee mobility and strength; balance   7:35 AM, 11/15/23 Greig KATHEE Fuse, PTA/CLT [facility] Ph: [phone]

## 2023-11-18 ENCOUNTER — Telehealth (HOSPITAL_COMMUNITY): Payer: Self-pay

## 2023-11-18 ENCOUNTER — Ambulatory Visit (HOSPITAL_COMMUNITY)

## 2023-11-18 ENCOUNTER — Encounter (HOSPITAL_COMMUNITY): Payer: Self-pay

## 2023-11-18 NOTE — Telephone Encounter (Signed)
 No show #1; called and left voicemail regarding missed visit this morning and reminder for upcoming appointment on 9/4.  11:36 AM, 11/18/23 Adama Ivins Small Tesneem Dufrane MPT [place] [facility] [place] [phone] Ph:[phone]

## 2023-11-23 ENCOUNTER — Ambulatory Visit (HOSPITAL_COMMUNITY): Attending: Orthopedic Surgery

## 2023-11-23 ENCOUNTER — Encounter (HOSPITAL_COMMUNITY): Payer: Self-pay

## 2023-11-23 ENCOUNTER — Telehealth: Payer: Self-pay | Admitting: Orthopedic Surgery

## 2023-11-23 DIAGNOSIS — M25662 Stiffness of left knee, not elsewhere classified: Secondary | ICD-10-CM | POA: Insufficient documentation

## 2023-11-23 DIAGNOSIS — R2689 Other abnormalities of gait and mobility: Secondary | ICD-10-CM | POA: Diagnosis present

## 2023-11-23 DIAGNOSIS — M25562 Pain in left knee: Secondary | ICD-10-CM | POA: Diagnosis present

## 2023-11-23 NOTE — Therapy (Addendum)
 OUTPATIENT PHYSICAL THERAPY LOWER EXTREMITY TREATMENT   Patient Name: Erica Fox MRN: [id number] DOB:1985/12/04, 38 y.o., female Today's Date: 11/23/2023  END OF SESSION:  PT End of Session - 11/23/23 0718     Visit Number 3    Number of Visits 8    Date for PT Re-Evaluation 12/08/23    Authorization Type UHC    Authorization - Visit Number 11   correct count   Authorization - Number of Visits 60    PT Start Time 0719    PT Stop Time 0758    PT Time Calculation (min) 39 min    Behavior During Therapy [facility] for tasks assessed/performed           Past Medical History:  Diagnosis Date   ADHD (attention deficit hyperactivity disorder) 2025   per patient   Anemia    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   left eye, dx by opthalmology   Degenerative joint disease    OCD (obsessive compulsive disorder) 2025   per patient   PTSD (post-traumatic stress disorder) 2025   per patient   Rheumatoid arthritis [facility])    Past Surgical History:  Procedure Laterality Date   CESAREAN SECTION     2007, 2012   EYE SURGERY Right    age 20, tighten muscle for lazy eye   FOOT SURGERY Left    hammertoe correction & screws placed (2 surgeries on left foot)   FOOT SURGERY Right 05/07/2022   hammertoe correction   TOTAL KNEE ARTHROPLASTY Left 07/28/2023   Procedure: ARTHROPLASTY, KNEE, TOTAL;  Surgeon: Addie Cordella Hamilton, MD;  Location: [facility] OR;  Service: Orthopedics;  Laterality: Left;   TUBAL LIGATION     WISDOM TOOTH EXTRACTION  2014   Patient Active Problem List   Diagnosis Date Noted   Chronic pain syndrome 08/17/2023   S/P total knee replacement, left 07/28/2023   Rosacea 02/02/2023   Seasonal allergies 08/26/2022   Rheumatoid arthritis of multiple sites with negative rheumatoid factor (HCC) 07/22/2022    PCP: Lendia Boby CROME, NP-C  REFERRING PROVIDER: Addie Cordella Hamilton, MD  REFERRING DIAG: [phone] (ICD-10-CM) - S/P total knee arthroplasty, left  THERAPY DIAG:  Acute  pain of left knee  Stiffness of left knee, not elsewhere classified  Other abnormalities of gait and mobility  Rationale for Evaluation and Treatment: Rehabilitation  ONSET DATE: 07/28/23  SUBJECTIVE:   SUBJECTIVE STATEMENT: Knee is stiff, no reports of pain currently.  Exercises are going well at home.    Evaluation:  S/p left TKA 07/28/23 per Dr. Addie; started therapy in [place] at [facility].  Had to stop after 8 visit due to insurance lapse; now is on her husbands insurance and can resume PT.  Was supposed to home home health initially but they never showed up so started out 3 weeks behind.  Diagnosed with JRA at 54 or 38 years old.  Supposed to have right knee scoped 12/05/23  per Dr. Addie.     PERTINENT HISTORY: ADHD OCD PTSD Anemia RA Has a pain management MD PAIN:  Are you having pain? Yes: NPRS scale: 0 pain currently up to 1 or 2 max Pain location: left knee Pain description: stiff and uncomfortable Aggravating factors: prolonged walking, stiff in the mornings, prolonging walking Relieving factors: movement  PRECAUTIONS: None  RED FLAGS: None   WEIGHT BEARING RESTRICTIONS: No  FALLS:  Has patient fallen in last 6 months? 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  DIAGNOSTIC FINDINGS: had MRI of both knees in April; see under imaging  PATIENT SURVEYS:  LEFS  Extreme difficulty/unable (0), Quite a bit of difficulty (1), Moderate difficulty (2), Little difficulty (3), No difficulty (4) Survey date:    Any of your usual work, housework or school activities   2. Usual hobbies, recreational or sporting activities   3. Getting into/out of the bath   4. Walking between rooms   5. Putting on  socks/shoes   6. Squatting    7. Lifting an object, like a bag of groceries from the floor   8. Performing light activities around your home   9. Performing heavy activities around your home   10. Getting into/out of a car   11. Walking 2 blocks   12. Walking 1 mile   13. Going up/down 10 stairs (1 flight)   14. Standing for 1 hour   15.  sitting for 1 hour   16. Running on even ground   17. Running on uneven ground   18. Making sharp turns while running fast   19. Hopping    20. Rolling over in bed   Score total:  41/80; 51.2 %     COGNITION: Overall cognitive status: Within functional limits for tasks assessed     SENSATION: WFL  EDEMA:     PALPATION: General tenderness  LOWER EXTREMITY ROM:  Active ROM Right eval Left eval  Hip flexion    Hip extension    Hip abduction    Hip adduction    Hip internal rotation    Hip external rotation    Knee flexion 124 108  Knee extension 0 -5  Ankle dorsiflexion    Ankle plantarflexion    Ankle inversion    Ankle eversion     (Blank rows = not tested)  LOWER EXTREMITY MMT:  MMT Right eval Left eval  Hip flexion 4+ 5  Hip extension 4+ 5  Hip abduction 5 5  Hip adduction    Hip internal rotation    Hip external rotation    Knee flexion 5 4+ in avail ROM in prone  Knee extension 5 4+  Ankle dorsiflexion 5 5  Ankle plantarflexion    Ankle inversion    Ankle eversion     (Blank rows = not tested)   FUNCTIONAL TESTS:  5 times sit to stand: 14.37 sec 2 minute walk test: 465 ft without AD 11/15/23; SLS:  Rt: 14, Lt 8 max of 2 tries  GAIT: Distance walked: 465 ft no AD Assistive device utilized: None Level of assistance: Modified independence Comments: slight decreased gait speed  TREATMENT DATE:  9/3/235: Bike seat 15 x 5' for mobility; full revolutions Standing:  -Left  Knee drives flexion on 12 step x 10X10  -TKE RTB 10x 5  - Heel raises 10x incline slope  - Toe raises decline slope 10x  - Lt knee hamstring curl 10x Prone:  - Hamstring curl 10x  - Quad stretch 3x 30 Manual:  MFR surrounding knee incision Supine:  - Heel slide 10  - Bridge 10x  - AROM 2-109 PROM 112  11/15/23 Goal review  Bike seat 15 x 5' for mobility; full revolutions Standing:  Left Knee drives flexion on 12 step x 10X10 Lt hamstring stretch 3X30 onto 12 step Lt knee flexion 10X SLS:  Rt: 14, Lt 8 max of 2 tries Prone hamstring and hip extensor MMT  Knee flexion Lt 10X Supine:  Lt SLR 10X  Heelslides Lt 10X  AROM -2 to 105 (110 PROM)   11/10/23 physical therapy evaluation and HEP instruction  Bike seat 15 x 5' for mobility; full revolutions Heel raises x 10 Left Knee drives flexion on 6 step x 10 Updated HEP   PATIENT EDUCATION:  Education details: Patient educated on exam findings, POC, scope of PT, HEP, and what to expect next visit. Person educated: Patient Education method: Explanation, Demonstration, and Handouts Education comprehension: verbalized understanding, returned demonstration, verbal cues required, and tactile cues required  HOME EXERCISE PROGRAM: [email or web address]  Access Code: [email or web address] URL: [email or web address] Date: 11/10/2023 Prepared by: AP - Rehab  Exercises - Seated Knee Flexion AAROM  - 3 x daily - 7 x weekly - 2-3 sets - 1 reps - 71m hold - Active Straight Leg Raise with Quad Set  - 1 x daily - 7 x weekly - 3 sets - 10 reps - Supine Quad Set  - 1-3 x daily - 7 x weekly - 2-3 sets - 10 reps - 10s hold - Supine Isometric Hamstring Set  - 1 x daily - 7 x weekly - 2-3 sets - 10 reps - 10s hold - Supine Heel Slide  - 1 x daily - 7 x weekly - 1 sets - 10 reps - Heel Raises with Counter Support  - 1 x daily - 7 x weekly - 1 sets - 10 reps - Sit to Stand Without Arm Support  - 1 x daily - 7 x weekly - 1 sets - 10 reps - knee  bends on the step using rails to hold onto  - 1 x daily - 7 x weekly - 1 sets - 10 reps  ASSESSMENT:  CLINICAL IMPRESSION: Session focus with knee mobility and strengthening.  Added TKE for quad strengthening to address extension lag as well as prone quad stretch for mobility.  Pt able to complete all exercises with good form and mechanics, no reports of pain through session.  Manual MFR complete proximal knee with main limitation superior incision.  AROM improved 2-109 degrees and able to achieve 112 degrees PROM.  Added quad stretch to HEP with printout given.   OBJECTIVE IMPAIRMENTS: Abnormal gait, difficulty walking, decreased ROM, increased fascial restrictions, impaired perceived functional ability, and pain.   ACTIVITY LIMITATIONS: bending, squatting, and locomotion level  PARTICIPATION LIMITATIONS: meal prep, cleaning, shopping, community activity, and occupation  PERSONAL FACTORS: RA are also affecting patient's functional outcome.   REHAB POTENTIAL: Good  CLINICAL DECISION MAKING: Evolving/moderate complexity  EVALUATION COMPLEXITY: Moderate   GOALS: Goals reviewed with patient? Yes  SHORT TERM GOALS: Target date: 11/24/2023 patient will be independent  with initial HEP  Baseline: Goal status: INITIAL  2.  Patient will report 50% improvement overall  Baseline:  Goal status: INITIAL   LONG TERM GOALS: Target date: 12/08/2023  Patient will be independent in self management strategies to improve quality of life and functional outcomes.  Baseline:  Goal status: INITIAL  2.  Patient will report 70% improvement overall  Baseline:  Goal status: INITIAL  3.   Patient will increase left leg MMT's to 5/5 to allow navigation of steps without gait deviation or loss of balance  Baseline:  Goal status: INITIAL  4.  Patient will increase left knee mobility to -2 to 120 to promote normal navigation of steps; step over step pattern  Baseline:  Goal status:  INITIAL  5.  Patient will improve LEFS score by 9 points to demonstrate improved perceived function  Baseline: 41/80 Goal status: INITIAL  6.  Patient will increase her distance on 2 MWT by 35 ft to demonstrate improved functional mobility with community ambulation Baseline: 465 ft Goal status: INITIAL   PLAN:  PT FREQUENCY: 2x/week  PT DURATION: 4 weeks  PLANNED INTERVENTIONS: 97164- PT Re-evaluation, 97110-Therapeutic exercises, 97530- Therapeutic activity, 97112- Neuromuscular re-education, 97535- Self Care, 02859- Manual therapy, [id number]- Gait training, [phone]- Orthotic Fit/training, [phone]- Canalith repositioning, [id number]- Aquatic Therapy, 97760- Splinting, [phone]- Wound care (first 20 sq cm), 97598- Wound care (each additional 20 sq cm)Patient/Family education, Balance training, Stair training, Taping, Dry Needling, Joint mobilization, Joint manipulation, Spinal manipulation, Spinal mobilization, Scar mobilization, and DME instructions.   PLAN FOR NEXT SESSION: continue to progress left knee mobility and strength; balance.  Next session complete contract/relax and begin seated treadmill hamstring curls.  Augustin Mclean, LPTA/CLT; CBIS [phone]' 9:50 AM, 11/23/23

## 2023-11-23 NOTE — Telephone Encounter (Signed)
 FYI:  patient rescheduled right knee arthroscopy with debridement.  Surgery was rescheduled from 12-05-23 to 12-12-23 at [facility] giving her an extra week to come up with the amount she would be responsible for on the day of surgery.

## 2023-11-24 ENCOUNTER — Ambulatory Visit (HOSPITAL_COMMUNITY): Admitting: Physical Therapy

## 2023-11-24 DIAGNOSIS — M25662 Stiffness of left knee, not elsewhere classified: Secondary | ICD-10-CM

## 2023-11-24 DIAGNOSIS — M25562 Pain in left knee: Secondary | ICD-10-CM

## 2023-11-24 DIAGNOSIS — R2689 Other abnormalities of gait and mobility: Secondary | ICD-10-CM

## 2023-11-24 NOTE — Therapy (Signed)
 OUTPATIENT PHYSICAL THERAPY LOWER EXTREMITY TREATMENT   Patient Name: Erica Fox MRN: [id number] DOB:1985-10-08, 38 y.o., female Today's Date: 11/24/2023  END OF SESSION:  PT End of Session - 11/24/23 0720     Visit Number 4    Number of Visits 8    Date for PT Re-Evaluation 12/08/23    Authorization Type UHC    Authorization - Number of Visits 60    PT Start Time 0720    PT Stop Time 0800    PT Time Calculation (min) 40 min    Behavior During Therapy [facility] for tasks assessed/performed           Past Medical History:  Diagnosis Date   ADHD (attention deficit hyperactivity disorder) 2025   per patient   Anemia    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   left eye, dx by opthalmology   Degenerative joint disease    OCD (obsessive compulsive disorder) 2025   per patient   PTSD (post-traumatic stress disorder) 2025   per patient   Rheumatoid arthritis [facility])    Past Surgical History:  Procedure Laterality Date   CESAREAN SECTION     2007, 2012   EYE SURGERY Right    age 37, tighten muscle for lazy eye   FOOT SURGERY Left    hammertoe correction & screws placed (2 surgeries on left foot)   FOOT SURGERY Right 05/07/2022   hammertoe correction   TOTAL KNEE ARTHROPLASTY Left 07/28/2023   Procedure: ARTHROPLASTY, KNEE, TOTAL;  Surgeon: Addie Cordella Hamilton, MD;  Location: [facility] OR;  Service: Orthopedics;  Laterality: Left;   TUBAL LIGATION     WISDOM TOOTH EXTRACTION  2014   Patient Active Problem List   Diagnosis Date Noted   Chronic pain syndrome 08/17/2023   S/P total knee replacement, left 07/28/2023   Rosacea 02/02/2023   Seasonal allergies 08/26/2022   Rheumatoid arthritis of multiple sites with negative rheumatoid factor (HCC) 07/22/2022    PCP: Lendia Boby CROME, NP-C  REFERRING PROVIDER: Addie Cordella Hamilton, MD  REFERRING DIAG: [phone] (ICD-10-CM) - S/P total knee arthroplasty, left  THERAPY DIAG:  Acute pain of left knee  Stiffness of left knee, not  elsewhere classified  Other abnormalities of gait and mobility  Rationale for Evaluation and Treatment: Rehabilitation  ONSET DATE: 07/28/23  SUBJECTIVE:   SUBJECTIVE STATEMENT: Lt Knee is stiff, Rt knee surgery is postponed 1 week until the 22nd for financial reasons.  Discussed therapy and if needed to complete HEP due to finances.  Rt knee is also stiff but usually starts hurting by the end of the day.     Evaluation:  S/p left TKA 07/28/23 per Dr. Addie; started therapy in [place] at [facility].  Had to stop after 8 visit due to insurance lapse; now is on her husbands insurance and can resume PT.  Was supposed to home home health initially but they never showed up so started out 3 weeks behind.  Diagnosed with JRA at 38 or 38 years old.  Supposed to have right knee scoped 12/12/23  per Dr. Addie.     PERTINENT HISTORY: ADHD OCD PTSD Anemia RA Has a pain management MD PAIN:  Are you having pain? Yes: NPRS scale: 0 pain currently up to 1 or 2 max Pain location: left knee Pain description: stiff and uncomfortable Aggravating factors: prolonged walking, stiff in the mornings, prolonging walking Relieving factors: movement  PRECAUTIONS: None  RED FLAGS: None   WEIGHT BEARING RESTRICTIONS: No  FALLS:  Has patient fallen in last 6 months? 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  DIAGNOSTIC FINDINGS: had MRI of both knees in April; see under imaging  PATIENT SURVEYS:  LEFS  Extreme difficulty/unable (0), Quite a bit of difficulty (1), Moderate difficulty (2), Little difficulty (3), No difficulty (4) Survey date:    Any of your usual work, housework or school activities   2. Usual hobbies, recreational or  sporting activities   3. Getting into/out of the bath   4. Walking between rooms   5. Putting on socks/shoes   6. Squatting    7. Lifting an object, like a bag of groceries from the floor   8. Performing light activities around your home   9. Performing heavy activities around your home   10. Getting into/out of a car   11. Walking 2 blocks   12. Walking 1 mile   13. Going up/down 10 stairs (1 flight)   14. Standing for 1 hour   15.  sitting for 1 hour   16. Running on even ground   17. Running on uneven ground   18. Making sharp turns while running fast   19. Hopping    20. Rolling over in bed   Score total:  41/80; 51.2 %     COGNITION: Overall cognitive status: Within functional limits for tasks assessed     SENSATION: WFL  EDEMA:     PALPATION: General tenderness  LOWER EXTREMITY ROM:  Active ROM Right eval Left eval Left 11/24/23  Hip flexion     Hip extension     Hip abduction     Hip adduction     Hip internal rotation     Hip external rotation     Knee flexion 124 108 115  Knee extension 0 -5 -2  Ankle dorsiflexion     Ankle plantarflexion     Ankle inversion     Ankle eversion      (Blank rows = not tested)  LOWER EXTREMITY MMT:  MMT Right eval Left eval  Hip flexion 4+ 5  Hip extension 4+ 5  Hip abduction 5 5  Hip adduction    Hip internal rotation    Hip external rotation    Knee flexion 5 4+ in avail ROM in prone  Knee extension 5 4+  Ankle dorsiflexion 5 5  Ankle plantarflexion    Ankle inversion    Ankle eversion     (Blank rows = not tested)   FUNCTIONAL TESTS:  5 times sit to stand: 14.37 sec 2 minute walk test: 465 ft without AD 11/15/23; SLS:  Rt: 14, Lt 8 max of 2 tries  GAIT: Distance walked: 465 ft no AD Assistive device utilized: None Level of assistance: Modified independence Comments: slight decreased gait speed  TREATMENT DATE:  9/4/235: Bike seat 15 x 5' for mobility; full revolutions Standing:  Left Knee drives flexion on 12 step x 10X10  Heel raises 20x incline slope  Toe raises decline slope 20x  TKE GTB 10x 5; 2 sets Lt knee  Lt knee hamstring curl 10x Seated:  Lt hamstring curls on treadmill 20X Manual: Prone contract relax 2X Lt knee flexion  MFR surrounding knee incision, mostly proximal scar Lt knee Supine:  Heel slide 10X  SAQ with bolster 10X5  AROM 2-115 after manual   9/3/235: Bike seat 15 x 5' for mobility; full revolutions Standing:  -Left Knee drives flexion on 12 step x 10X10  -TKE RTB 10x 5  - Heel raises 10x incline slope  - Toe raises decline slope 10x  - Lt knee hamstring curl 10x Prone:  - Hamstring curl 10x  - Quad stretch 3x 30 Manual:  MFR surrounding knee incision Supine:  - Heel slide 10  - Bridge 10x  - AROM 2-109 PROM 112  11/15/23 Goal review  Bike seat 15 x 5' for mobility; full revolutions Standing:  Left Knee drives flexion on 12 step x 10X10 Lt hamstring stretch 3X30 onto 12 step Lt knee flexion 10X SLS:  Rt: 14, Lt 8 max of 2 tries Prone hamstring and hip extensor MMT  Knee flexion Lt 10X Supine:  Lt SLR 10X  Heelslides Lt 10X  AROM -2 to 105 (110 PROM)   11/10/23 physical therapy evaluation and HEP instruction  Bike seat 15 x 5' for mobility; full revolutions Heel raises x 10 Left Knee drives flexion on 6 step x 10 Updated HEP   PATIENT EDUCATION:  Education details: Patient educated on exam findings, POC, scope of PT, HEP, and what to expect next visit. Person educated: Patient Education method: Explanation, Demonstration, and Handouts Education comprehension: verbalized understanding, returned demonstration, verbal cues required, and tactile cues required  HOME EXERCISE PROGRAM: [email or web address]  Access Code: [email or web address] URL: [email or web address] Date: 11/10/2023 Prepared  by: AP - Rehab  Exercises - Seated Knee Flexion AAROM  - 3 x daily - 7 x weekly - 2-3 sets - 1 reps - 34m hold - Active Straight Leg Raise with Quad Set  - 1 x daily - 7 x weekly - 3 sets - 10 reps - Supine Quad Set  - 1-3 x daily - 7 x weekly - 2-3 sets - 10 reps - 10s hold - Supine Isometric Hamstring Set  - 1 x daily - 7 x weekly - 2-3 sets - 10 reps - 10s hold - Supine Heel Slide  - 1 x daily - 7 x weekly - 1 sets - 10 reps - Heel Raises with Counter Support  - 1 x daily - 7 x weekly - 1 sets - 10 reps - Sit to Stand Without Arm Support  - 1 x daily - 7 x weekly - 1 sets - 10 reps - knee bends on the step using rails to hold onto  - 1 x daily - 7 x weekly - 1 sets - 10 reps  ASSESSMENT:  CLINICAL IMPRESSION: Continued with focus on Lt knee mobility and strengthening.  Increased reps and to green tband for TKE for quad strengthening. Today, added hamstring curls on treadmill belt in seated with noted challenge.   Pt able to complete all exercises with good form and mechanics, no reports of pain through session.  Began contract relax in prone for knee flexion and continued with  MFR to reduce  adhesions. Mostly at proximal end of scar,   AROM improved 2-115 degrees following manual.     OBJECTIVE IMPAIRMENTS: Abnormal gait, difficulty walking, decreased ROM, increased fascial restrictions, impaired perceived functional ability, and pain.   ACTIVITY LIMITATIONS: bending, squatting, and locomotion level  PARTICIPATION LIMITATIONS: meal prep, cleaning, shopping, community activity, and occupation  PERSONAL FACTORS: RA are also affecting patient's functional outcome.   REHAB POTENTIAL: Good  CLINICAL DECISION MAKING: Evolving/moderate complexity  EVALUATION COMPLEXITY: Moderate   GOALS: Goals reviewed with patient? Yes  SHORT TERM GOALS: Target date: 11/24/2023 patient will be independent with initial HEP  Baseline: Goal status: INITIAL  2.  Patient will report 50% improvement  overall  Baseline:  Goal status: INITIAL   LONG TERM GOALS: Target date: 12/08/2023  Patient will be independent in self management strategies to improve quality of life and functional outcomes.  Baseline:  Goal status: INITIAL  2.  Patient will report 70% improvement overall  Baseline:  Goal status: INITIAL  3.   Patient will increase left leg MMT's to 5/5 to allow navigation of steps without gait deviation or loss of balance  Baseline:  Goal status: INITIAL  4.  Patient will increase left knee mobility to -2 to 120 to promote normal navigation of steps; step over step pattern  Baseline:  Goal status: INITIAL  5.  Patient will improve LEFS score by 9 points to demonstrate improved perceived function  Baseline: 41/80 Goal status: INITIAL  6.  Patient will increase her distance on 2 MWT by 35 ft to demonstrate improved functional mobility with community ambulation Baseline: 465 ft Goal status: INITIAL   PLAN:  PT FREQUENCY: 2x/week  PT DURATION: 4 weeks  PLANNED INTERVENTIONS: 97164- PT Re-evaluation, 97110-Therapeutic exercises, 97530- Therapeutic activity, 97112- Neuromuscular re-education, 97535- Self Care, 02859- Manual therapy, [id number]- Gait training, [phone]- Orthotic Fit/training, [phone]- Canalith repositioning, [id number]- Aquatic Therapy, 97760- Splinting, [phone]- Wound care (first 20 sq cm), 97598- Wound care (each additional 20 sq cm)Patient/Family education, Balance training, Stair training, Taping, Dry Needling, Joint mobilization, Joint manipulation, Spinal manipulation, Spinal mobilization, Scar mobilization, and DME instructions.   PLAN FOR NEXT SESSION: continue to progress left knee mobility and strength; balance.    Greig KATHEE Fuse, PTA/CLT [facility] Ph: [phone] 7:21 AM, 11/24/23

## 2023-11-29 ENCOUNTER — Ambulatory Visit (HOSPITAL_COMMUNITY)

## 2023-11-29 DIAGNOSIS — M25662 Stiffness of left knee, not elsewhere classified: Secondary | ICD-10-CM

## 2023-11-29 DIAGNOSIS — M25562 Pain in left knee: Secondary | ICD-10-CM

## 2023-11-29 DIAGNOSIS — R2689 Other abnormalities of gait and mobility: Secondary | ICD-10-CM

## 2023-11-29 NOTE — Progress Notes (Deleted)
 Subjective:    Patient ID: Erica Fox, female    DOB: 1985/08/01, 38 y.o.   MRN: [id number]  HPI   Pain Inventory Average Pain {NUMBERS; 0-10:5044} Pain Right Now {NUMBERS; 0-10:5044} My pain is {PAIN DESCRIPTION:21022940}  In the last 24 hours, has pain interfered with the following? General activity {NUMBERS; 0-10:5044} Relation with others {NUMBERS; 0-10:5044} Enjoyment of life {NUMBERS; 0-10:5044} What TIME of day is your pain at its worst? {time of day:24191} Sleep (in general) {BHH GOOD/FAIR/POOR:22877}  Pain is worse with: {ACTIVITIES:21022942} Pain improves with: {PAIN IMPROVES TPUY:78977056} Relief from Meds: {NUMBERS; 0-10:5044}  Family History  Problem Relation Age of Onset   Heart disease Mother    Breast cancer Paternal Aunt    Healthy Son    Healthy Son    Social History   Socioeconomic History   Marital status: Married    Spouse name: Not on file   Number of children: 2   Years of education: Not on file   Highest education level: Associate degree: academic program  Occupational History   Not on file  Tobacco Use   Smoking status: Never    Passive exposure: Past   Smokeless tobacco: Never  Vaping Use   Vaping status: Never Used  Substance and Sexual Activity   Alcohol use: Yes    Comment: maybe a drink once every 2 months   Drug use: Not Currently   Sexual activity: Yes    Partners: Male    Birth control/protection: I.U.D., Surgical    Comment: tubal, Mirena , menarche 38yo, sexual debut 38yo  Other Topics Concern   Not on file  Social History Narrative   Not on file   Social Drivers of Health   Financial Resource Strain: Low Risk  (07/03/2023)   Overall Financial Resource Strain (CARDIA)    Difficulty of Paying Living Expenses: Not very hard  Food Insecurity: No Food Insecurity (07/28/2023)   Hunger Vital Sign    Worried About Running Out of Food in the Last Year: Never true    Ran Out of Food in the Last Year: Never true   Transportation Needs: No Transportation Needs (07/28/2023)   PRAPARE - Administrator, Civil Service (Medical): No    Lack of Transportation (Non-Medical): No  Physical Activity: Unknown (07/03/2023)   Exercise Vital Sign    Days of Exercise per Week: 0 days    Minutes of Exercise per Session: Not on file  Stress: Stress Concern Present (07/03/2023)   [employer] of Occupational Health - Occupational Stress Questionnaire    Feeling of Stress : To some extent  Social Connections: Socially Isolated (07/03/2023)   Social Connection and Isolation Panel    Frequency of Communication with Friends and Family: Never    Frequency of Social Gatherings with Friends and Family: Never    Attends Religious Services: Never    Database administrator or Organizations: No    Attends Engineer, structural: Not on file    Marital Status: Married   Past Surgical History:  Procedure Laterality Date   CESAREAN SECTION     2007, 2012   EYE SURGERY Right    age 30, tighten muscle for lazy eye   FOOT SURGERY Left    hammertoe correction & screws placed (2 surgeries on left foot)   FOOT SURGERY Right 05/07/2022   hammertoe correction   TOTAL KNEE ARTHROPLASTY Left 07/28/2023   Procedure: ARTHROPLASTY, KNEE, TOTAL;  Surgeon: Addie Cordella Hamilton, MD;  Location:  [facility] OR;  Service: Orthopedics;  Laterality: Left;   TUBAL LIGATION     WISDOM TOOTH EXTRACTION  2014   Past Surgical History:  Procedure Laterality Date   CESAREAN SECTION     2007, 2012   EYE SURGERY Right    age 47, tighten muscle for lazy eye   FOOT SURGERY Left    hammertoe correction & screws placed (2 surgeries on left foot)   FOOT SURGERY Right 05/07/2022   hammertoe correction   TOTAL KNEE ARTHROPLASTY Left 07/28/2023   Procedure: ARTHROPLASTY, KNEE, TOTAL;  Surgeon: Addie Cordella Hamilton, MD;  Location: [facility] OR;  Service: Orthopedics;  Laterality: Left;   TUBAL LIGATION     WISDOM TOOTH EXTRACTION  2014   Past  Medical History:  Diagnosis Date   ADHD (attention deficit hyperactivity disorder) 2025   per patient   Anemia    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  Opioid Risk Score:   Fall Risk Score:  `1  Depression screen [facility] 2/9     08/17/2023    3:13 PM 07/05/2023   10:45 AM 02/02/2023   11:10 AM 03/11/2022    2:53 PM  Depression screen PHQ 2/9  Decreased Interest 2 0 0 1  Down, Depressed, Hopeless 1 0 0 1  PHQ - 2 Score 3 0 0 2  Altered sleeping 3   0  Tired, decreased energy 3   2  Change in appetite 0   3  Feeling bad or failure about yourself  1   1  Trouble concentrating 3   2  Moving slowly or fidgety/restless 0   3  Suicidal thoughts 0   0  PHQ-9 Score 13   13    Review of Systems     Objective:   Physical Exam        Assessment & Plan:

## 2023-11-29 NOTE — Therapy (Signed)
 OUTPATIENT PHYSICAL THERAPY LOWER EXTREMITY TREATMENT   Patient Name: Erica Fox MRN: [id number] DOB:11/15/1985, 38 y.o., female Today's Date: 11/29/2023  END OF SESSION:  PT End of Session - 11/29/23 0720     Visit Number 5    Number of Visits 8    Date for PT Re-Evaluation 12/08/23    Authorization Type U[employer]    Authorization - Number of Visits 60    PT Start Time 0718    PT Stop Time 0758    PT Time Calculation (min) 40 min    Activity Tolerance Patient tolerated treatment well    Behavior During Therapy [facility] for tasks assessed/performed           Past Medical History:  Diagnosis Date   ADHD (attention deficit hyperactivity disorder) 2025   per patient   Anemia    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   left eye, dx by opthalmology   Degenerative joint disease    OCD (obsessive compulsive disorder) 2025   per patient   PTSD (post-traumatic stress disorder) 2025   per patient   Rheumatoid arthritis [facility])    Past Surgical History:  Procedure Laterality Date   CESAREAN SECTION     2007, 2012   EYE SURGERY Right    age 37, tighten muscle for lazy eye   FOOT SURGERY Left    hammertoe correction & screws placed (2 surgeries on left foot)   FOOT SURGERY Right 05/07/2022   hammertoe correction   TOTAL KNEE ARTHROPLASTY Left 07/28/2023   Procedure: ARTHROPLASTY, KNEE, TOTAL;  Surgeon: Addie Cordella Hamilton, MD;  Location: [facility] OR;  Service: Orthopedics;  Laterality: Left;   TUBAL LIGATION     WISDOM TOOTH EXTRACTION  2014   Patient Active Problem List   Diagnosis Date Noted   Chronic pain syndrome 08/17/2023   S/P total knee replacement, left 07/28/2023   Rosacea 02/02/2023   Seasonal allergies 08/26/2022   Rheumatoid arthritis of multiple sites with negative rheumatoid factor (HCC) 07/22/2022    PCP: Lendia Boby CROME, NP-C  REFERRING PROVIDER: Addie Cordella Hamilton, MD  REFERRING DIAG: [phone] (ICD-10-CM) - S/P total knee arthroplasty, left  THERAPY DIAG:   Acute pain of left knee  Stiffness of left knee, not elsewhere classified  Other abnormalities of gait and mobility  Rationale for Evaluation and Treatment: Rehabilitation  ONSET DATE: 07/28/23  SUBJECTIVE:   SUBJECTIVE STATEMENT: Patient reports left knee stiffness this morning with the cooler weather.    Evaluation:  S/p left TKA 07/28/23 per Dr. Addie; started therapy in [place] at [facility].  Had to stop after 8 visit due to insurance lapse; now is on her husbands insurance and can resume PT.  Was supposed to home home health initially but they never showed up so started out 3 weeks behind.  Diagnosed with JRA at 79 or 38 years old.  Supposed to have right knee scoped 12/12/23  per Dr. Addie.     PERTINENT HISTORY: ADHD OCD PTSD Anemia RA Has a pain management MD PAIN:  Are you having pain? Yes: NPRS scale: 0 pain currently up to 1 or 2 max Pain location: left knee Pain description: stiff and uncomfortable Aggravating factors: prolonged walking, stiff in the mornings, prolonging walking Relieving factors: movement  PRECAUTIONS: None  RED FLAGS: None   WEIGHT BEARING RESTRICTIONS: No  FALLS:  Has patient fallen in last 6 months? 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  DIAGNOSTIC FINDINGS: had MRI of both knees in April; see under imaging  PATIENT SURVEYS:  LEFS  Extreme difficulty/unable (0), Quite a bit of difficulty (1), Moderate difficulty (2), Little difficulty (3), No difficulty (4) Survey date:    Any of your usual work, housework or school activities   2. Usual hobbies, recreational or sporting activities   3. Getting into/out of the bath   4. Walking between rooms   5. Putting on  socks/shoes   6. Squatting    7. Lifting an object, like a bag of groceries from the floor   8. Performing light activities around your home   9. Performing heavy activities around your home   10. Getting into/out of a car   11. Walking 2 blocks   12. Walking 1 mile   13. Going up/down 10 stairs (1 flight)   14. Standing for 1 hour   15.  sitting for 1 hour   16. Running on even ground   17. Running on uneven ground   18. Making sharp turns while running fast   19. Hopping    20. Rolling over in bed   Score total:  41/80; 51.2 %     COGNITION: Overall cognitive status: Within functional limits for tasks assessed     SENSATION: WFL  EDEMA:     PALPATION: General tenderness  LOWER EXTREMITY ROM:  Active ROM Right eval Left eval Left 11/24/23  Hip flexion     Hip extension     Hip abduction     Hip adduction     Hip internal rotation     Hip external rotation     Knee flexion 124 108 115  Knee extension 0 -5 -2  Ankle dorsiflexion     Ankle plantarflexion     Ankle inversion     Ankle eversion      (Blank rows = not tested)  LOWER EXTREMITY MMT:  MMT Right eval Left eval  Hip flexion 4+ 5  Hip extension 4+ 5  Hip abduction 5 5  Hip adduction    Hip internal rotation    Hip external rotation    Knee flexion 5 4+ in avail ROM in prone  Knee extension 5 4+  Ankle dorsiflexion 5 5  Ankle plantarflexion    Ankle inversion    Ankle eversion     (Blank rows = not tested)   FUNCTIONAL TESTS:  5 times sit to stand: 14.37 sec 2 minute walk test: 465 ft without AD 11/15/23; SLS:  Rt: 14, Lt 8 max of 2 tries  GAIT: Distance walked: 465 ft no AD Assistive device utilized: None Level of assistance: Modified independence Comments: slight decreased gait speed  TREATMENT DATE:  11/29/23 Bike seat 15 x 5' for mobility; dynamic  warm up level 3 Left knee drives for flexion x 3' Standing: Heel raises on incline x 20 Toe raises on decline x 20 Slant board 5 x 20 Leg press 6 plates 2 legs 2 x 10; 6 plates left leg only x 10 Squats to chair for target 2 x 10 Cybex hamstring curls 4 plates both concentric and left only eccentric 2 x 10 Tandem stance on foam  2 x 30 each AAROM left knee flexion in supine 120 degrees        9/4/235: Bike seat 15 x 5' for mobility; full revolutions Standing:  Left Knee drives flexion on 12 step x 10X10  Heel raises 20x incline slope  Toe raises decline slope 20x  TKE GTB 10x 5; 2 sets Lt knee  Lt knee hamstring curl 10x Seated:  Lt hamstring curls on treadmill 20X Manual: Prone contract relax 2X Lt knee flexion  MFR surrounding knee incision, mostly proximal scar Lt knee Supine:  Heel slide 10X  SAQ with bolster 10X5  AROM 2-115 after manual   9/3/235: Bike seat 15 x 5' for mobility; full revolutions Standing:  -Left Knee drives flexion on 12 step x 10X10  -TKE RTB 10x 5  - Heel raises 10x incline slope  - Toe raises decline slope 10x  - Lt knee hamstring curl 10x Prone:  - Hamstring curl 10x  - Quad stretch 3x 30 Manual:  MFR surrounding knee incision Supine:  - Heel slide 10  - Bridge 10x  - AROM 2-109 PROM 112  11/15/23 Goal review  Bike seat 15 x 5' for mobility; full revolutions Standing:  Left Knee drives flexion on 12 step x 10X10 Lt hamstring stretch 3X30 onto 12 step Lt knee flexion 10X SLS:  Rt: 14, Lt 8 max of 2 tries Prone hamstring and hip extensor MMT  Knee flexion Lt 10X Supine:  Lt SLR 10X  Heelslides Lt 10X  AROM -2 to 105 (110 PROM)   11/10/23 physical therapy evaluation and HEP instruction  Bike seat 15 x 5' for mobility; full revolutions Heel raises x 10 Left Knee drives flexion on 6 step x 10 Updated HEP   PATIENT EDUCATION:  Education details: Patient educated on exam findings, POC, scope of PT, HEP, and  what to expect next visit. Person educated: Patient Education method: Explanation, Demonstration, and Handouts Education comprehension: verbalized understanding, returned demonstration, verbal cues required, and tactile cues required  HOME EXERCISE PROGRAM: [email or web address]  Access Code: [email or web address] URL: [email or web address] Date: 11/10/2023 Prepared by: [facility] - Rehab  Exercises - Seated Knee Flexion AAROM  - 3 x daily - 7 x weekly - 2-3 sets - 1 reps - 21m hold - Active Straight Leg Raise with Quad Set  - 1 x daily - 7 x weekly - 3 sets - 10 reps - Supine Quad Set  - 1-3 x daily - 7 x weekly - 2-3 sets - 10 reps - 10s hold - Supine Isometric Hamstring Set  - 1 x daily - 7 x weekly - 2-3 sets - 10 reps - 10s hold - Supine Heel Slide  - 1 x daily - 7 x weekly - 1 sets - 10 reps - Heel Raises with Counter Support  - 1 x daily - 7 x weekly - 1 sets - 10 reps - Sit to Stand Without Arm Support  - 1 x daily - 7 x weekly - 1 sets -  10 reps - knee bends on the step using rails to hold onto  - 1 x daily - 7 x weekly - 1 sets - 10 reps  ASSESSMENT:  CLINICAL IMPRESSION: Today's session started with warm up on the bike; full revolutions.   Initially stiff with revolutions but becomes smoother with continued revolutions.  Leg press added today; go up to 8 plates for 2 legs next visit.  Continues with slowly progressing knee flexion.  Patient will benefit from continued skilled therapy services to address deficits and promote return to optimal function.       OBJECTIVE IMPAIRMENTS: Abnormal gait, difficulty walking, decreased ROM, increased fascial restrictions, impaired perceived functional ability, and pain.   ACTIVITY LIMITATIONS: bending, squatting, and locomotion level  PARTICIPATION LIMITATIONS: meal prep, cleaning, shopping, community activity, and occupation  PERSONAL FACTORS: RA are also affecting patient's functional outcome.   REHAB POTENTIAL: Good  CLINICAL DECISION MAKING:  Evolving/moderate complexity  EVALUATION COMPLEXITY: Moderate   GOALS: Goals reviewed with patient? Yes  SHORT TERM GOALS: Target date: 11/24/2023 patient will be independent with initial HEP  Baseline: Goal status: INITIAL  2.  Patient will report 50% improvement overall  Baseline:  Goal status: INITIAL   LONG TERM GOALS: Target date: 12/08/2023  Patient will be independent in self management strategies to improve quality of life and functional outcomes.  Baseline:  Goal status: INITIAL  2.  Patient will report 70% improvement overall  Baseline:  Goal status: INITIAL  3.   Patient will increase left leg MMT's to 5/5 to allow navigation of steps without gait deviation or loss of balance  Baseline:  Goal status: INITIAL  4.  Patient will increase left knee mobility to -2 to 120 to promote normal navigation of steps; step over step pattern  Baseline:  Goal status: INITIAL  5.  Patient will improve LEFS score by 9 points to demonstrate improved perceived function  Baseline: 41/80 Goal status: INITIAL  6.  Patient will increase her distance on 2 MWT by 35 ft to demonstrate improved functional mobility with community ambulation Baseline: 465 ft Goal status: INITIAL   PLAN:  PT FREQUENCY: 2x/week  PT DURATION: 4 weeks  PLANNED INTERVENTIONS: 97164- PT Re-evaluation, 97110-Therapeutic exercises, 97530- Therapeutic activity, 97112- Neuromuscular re-education, 97535- Self Care, 02859- Manual therapy, [id number]- Gait training, [phone]- Orthotic Fit/training, [phone]- Canalith repositioning, [id number]- Aquatic Therapy, 97760- Splinting, [phone]- Wound care (first 20 sq cm), 97598- Wound care (each additional 20 sq cm)Patient/Family education, Balance training, Stair training, Taping, Dry Needling, Joint mobilization, Joint manipulation, Spinal manipulation, Spinal mobilization, Scar mobilization, and DME instructions.   PLAN FOR NEXT SESSION: continue to progress left knee mobility  and strength; balance.  Plans to have right knee scope 9/22  7:21 AM, 11/29/23 Kalia Vahey Small Jerold Yoss MPT [facility] [place] [phone]

## 2023-11-30 ENCOUNTER — Encounter: Attending: Physical Medicine & Rehabilitation | Admitting: Physical Medicine & Rehabilitation

## 2023-12-02 ENCOUNTER — Encounter (HOSPITAL_COMMUNITY)

## 2023-12-02 ENCOUNTER — Encounter (HOSPITAL_COMMUNITY): Payer: Self-pay

## 2023-12-06 ENCOUNTER — Ambulatory Visit (HOSPITAL_COMMUNITY): Admitting: Physical Therapy

## 2023-12-06 DIAGNOSIS — R2689 Other abnormalities of gait and mobility: Secondary | ICD-10-CM

## 2023-12-06 DIAGNOSIS — M25662 Stiffness of left knee, not elsewhere classified: Secondary | ICD-10-CM

## 2023-12-06 DIAGNOSIS — M25562 Pain in left knee: Secondary | ICD-10-CM | POA: Diagnosis not present

## 2023-12-06 NOTE — Therapy (Signed)
 OUTPATIENT PHYSICAL THERAPY LOWER EXTREMITY TREATMENT   Patient Name: Erica Fox MRN: [id number] DOB:01/16/1986, 38 y.o., female Today's Date: 12/06/2023  END OF SESSION:  PT End of Session - 12/06/23 0729     Visit Number 6    Number of Visits 8    Date for PT Re-Evaluation 12/08/23    Authorization Type UHC    Authorization - Number of Visits 60    PT Start Time 0721    PT Stop Time 0759    PT Time Calculation (min) 38 min    Activity Tolerance Patient tolerated treatment well    Behavior During Therapy [facility] for tasks assessed/performed           Past Medical History:  Diagnosis Date   ADHD (attention deficit hyperactivity disorder) 2025   per patient   Anemia    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   left eye, dx by opthalmology   Degenerative joint disease    OCD (obsessive compulsive disorder) 2025   per patient   PTSD (post-traumatic stress disorder) 2025   per patient   Rheumatoid arthritis [facility])    Past Surgical History:  Procedure Laterality Date   CESAREAN SECTION     2007, 2012   EYE SURGERY Right    age 35, tighten muscle for lazy eye   FOOT SURGERY Left    hammertoe correction & screws placed (2 surgeries on left foot)   FOOT SURGERY Right 05/07/2022   hammertoe correction   TOTAL KNEE ARTHROPLASTY Left 07/28/2023   Procedure: ARTHROPLASTY, KNEE, TOTAL;  Surgeon: Addie Cordella Hamilton, MD;  Location: [facility] OR;  Service: Orthopedics;  Laterality: Left;   TUBAL LIGATION     WISDOM TOOTH EXTRACTION  2014   Patient Active Problem List   Diagnosis Date Noted   Chronic pain syndrome 08/17/2023   S/P total knee replacement, left 07/28/2023   Rosacea 02/02/2023   Seasonal allergies 08/26/2022   Rheumatoid arthritis of multiple sites with negative rheumatoid factor (HCC) 07/22/2022    PCP: Lendia Boby CROME, NP-C  REFERRING PROVIDER: Addie Cordella Hamilton, MD  REFERRING DIAG: [phone] (ICD-10-CM) - S/P total knee arthroplasty, left  THERAPY DIAG:   Acute pain of left knee  Stiffness of left knee, not elsewhere classified  Other abnormalities of gait and mobility  Rationale for Evaluation and Treatment: Rehabilitation  ONSET DATE: 07/28/23  SUBJECTIVE:   SUBJECTIVE STATEMENT: Patient reports left knee stiffness this morning with the cooler weather.    Evaluation:  S/p left TKA 07/28/23 per Dr. Addie; started therapy in [place] at [facility].  Had to stop after 8 visit due to insurance lapse; now is on her husbands insurance and can resume PT.  Was supposed to home home health initially but they never showed up so started out 3 weeks behind.  Diagnosed with JRA at 82 or 38 years old.  Supposed to have right knee scoped 12/12/23  per Dr. Addie.     PERTINENT HISTORY: ADHD OCD PTSD Anemia RA Has a pain management MD PAIN:  Are you having pain? Yes: NPRS scale: 0 pain currently up to 1 or 2 max Pain location: left knee Pain description: stiff and uncomfortable Aggravating factors: prolonged walking, stiff in the mornings, prolonging walking Relieving factors: movement  PRECAUTIONS: None  RED FLAGS: None   WEIGHT BEARING RESTRICTIONS: No  FALLS:  Has patient fallen in last 6 months? 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  DIAGNOSTIC FINDINGS: had MRI of both knees in April; see under imaging  PATIENT SURVEYS:  LEFS  Extreme difficulty/unable (0), Quite a bit of difficulty (1), Moderate difficulty (2), Little difficulty (3), No difficulty (4) Survey date:    Any of your usual work, housework or school activities   2. Usual hobbies, recreational or sporting activities   3. Getting into/out of the bath   4. Walking between rooms   5. Putting on  socks/shoes   6. Squatting    7. Lifting an object, like a bag of groceries from the floor   8. Performing light activities around your home   9. Performing heavy activities around your home   10. Getting into/out of a car   11. Walking 2 blocks   12. Walking 1 mile   13. Going up/down 10 stairs (1 flight)   14. Standing for 1 hour   15.  sitting for 1 hour   16. Running on even ground   17. Running on uneven ground   18. Making sharp turns while running fast   19. Hopping    20. Rolling over in bed   Score total:  41/80; 51.2 %     COGNITION: Overall cognitive status: Within functional limits for tasks assessed     SENSATION: WFL  EDEMA:     PALPATION: General tenderness  LOWER EXTREMITY ROM:  Active ROM Right eval Left eval Left 11/24/23  Hip flexion     Hip extension     Hip abduction     Hip adduction     Hip internal rotation     Hip external rotation     Knee flexion 124 108 115  Knee extension 0 -5 -2  Ankle dorsiflexion     Ankle plantarflexion     Ankle inversion     Ankle eversion      (Blank rows = not tested)  LOWER EXTREMITY MMT:  MMT Right eval Left eval  Hip flexion 4+ 5  Hip extension 4+ 5  Hip abduction 5 5  Hip adduction    Hip internal rotation    Hip external rotation    Knee flexion 5 4+ in avail ROM in prone  Knee extension 5 4+  Ankle dorsiflexion 5 5  Ankle plantarflexion    Ankle inversion    Ankle eversion     (Blank rows = not tested)   FUNCTIONAL TESTS:  5 times sit to stand: 14.37 sec 2 minute walk test: 465 ft without AD 11/15/23; SLS:  Rt: 14, Lt 8 max of 2 tries  GAIT: Distance walked: 465 ft no AD Assistive device utilized: None Level of assistance: Modified independence Comments: slight decreased gait speed  TREATMENT DATE:  12/06/23: Bike seat 15 x 5' for mobility; full  revolutions Standing:  Left Knee drives flexion on 2nd staircase step 10X10  Heel raises 20x incline slope  Toe raises decline slope 20x Slant baord stretch 5X20 Seated:  Bodycraft leg press 8 pl X10 bil, Lt LE only 6 Pl 2X10  Cybex hamstring curls 6pl X10 bil, Lt only 4 Pl 2X10 Supine: manual proximal scar, scar massage   Heel slide 10X  SAQ with bolster 10X5  AROM 2-115 after manual  11/29/23 Bike seat 15 x 5' for mobility; dynamic warm up level 3 Left knee drives for flexion x 3' Standing: Heel raises on incline x 20 Toe raises on decline x 20 Slant board 5 x 20 Leg press 6 plates 2 legs 2 x 10; 6 plates left leg only x 10 Squats to chair for target 2 x 10 Cybex hamstring curls 4 plates both concentric and left only eccentric 2 x 10 Tandem stance on foam  2 x 30 each AAROM left knee flexion in supine 120 degrees  9/4/235: Bike seat 15 x 5' for mobility; full revolutions Standing:  Left Knee drives flexion on 12 step x 10X10  Heel raises 20x incline slope  Toe raises decline slope 20x  TKE GTB 10x 5; 2 sets Lt knee  Lt knee hamstring curl 10x Seated:  Lt hamstring curls on treadmill 20X Manual: Prone contract relax 2X Lt knee flexion  MFR surrounding knee incision, mostly proximal scar Lt knee Supine:  Heel slide 10X  SAQ with bolster 10X5  AROM 2-115 after manual   9/3/235: Bike seat 15 x 5' for mobility; full revolutions Standing:  -Left Knee drives flexion on 12 step x 10X10  -TKE RTB 10x 5  - Heel raises 10x incline slope  - Toe raises decline slope 10x  - Lt knee hamstring curl 10x Prone:  - Hamstring curl 10x  - Quad stretch 3x 30 Manual:  MFR surrounding knee incision Supine:  - Heel slide 10  - Bridge 10x  - AROM 2-109 PROM 112   PATIENT EDUCATION:  Education details: Patient educated on exam findings, POC, scope of PT, HEP, and what to expect next visit. Person educated: Patient Education method: Explanation, Demonstration, and  Handouts Education comprehension: verbalized understanding, returned demonstration, verbal cues required, and tactile cues required  HOME EXERCISE PROGRAM: [email or web address]  Access Code: [email or web address] URL: [email or web address] Date: 11/10/2023 Prepared by: AP - Rehab  Exercises - Seated Knee Flexion AAROM  - 3 x daily - 7 x weekly - 2-3 sets - 1 reps - 61m hold - Active Straight Leg Raise with Quad Set  - 1 x daily - 7 x weekly - 3 sets - 10 reps - Supine Quad Set  - 1-3 x daily - 7 x weekly - 2-3 sets - 10 reps - 10s hold - Supine Isometric Hamstring Set  - 1 x daily - 7 x weekly - 2-3 sets - 10 reps - 10s hold - Supine Heel Slide  - 1 x daily - 7 x weekly - 1 sets - 10 reps - Heel Raises with Counter Support  - 1 x daily - 7 x weekly - 1 sets - 10 reps - Sit to Stand Without Arm Support  - 1 x daily - 7 x weekly - 1 sets - 10 reps - knee bends on the step using rails to hold onto  - 1 x daily - 7 x weekly - 1 sets - 10  reps  ASSESSMENT:  CLINICAL IMPRESSION Continued with focus on improving Lt LE mobility and bil LE strength/stabilization.  Upcoming surgery on Rt LE next week, re-eval and possible discharge for Lt knee next session.  Pt is overall progressing well with Lt LE ROM and strength.  Completed strength machines for bil LE but also isolated Lt.  Able to increase weight for bil LE today by 20# on both machines. This increased challenge but able to complete without pain or issues.  Manual completed as continues to have large area of scar tissue proximal scar.  AROM remains 2-115 but able to complete SLR with slight lag.        OBJECTIVE IMPAIRMENTS: Abnormal gait, difficulty walking, decreased ROM, increased fascial restrictions, impaired perceived functional ability, and pain.   ACTIVITY LIMITATIONS: bending, squatting, and locomotion level  PARTICIPATION LIMITATIONS: meal prep, cleaning, shopping, community activity, and occupation  PERSONAL FACTORS: RA are also affecting  patient's functional outcome.   REHAB POTENTIAL: Good  CLINICAL DECISION MAKING: Evolving/moderate complexity  EVALUATION COMPLEXITY: Moderate   GOALS: Goals reviewed with patient? Yes  SHORT TERM GOALS: Target date: 11/24/2023 patient will be independent with initial HEP  Baseline: Goal status: INITIAL  2.  Patient will report 50% improvement overall  Baseline:  Goal status: INITIAL   LONG TERM GOALS: Target date: 12/08/2023  Patient will be independent in self management strategies to improve quality of life and functional outcomes.  Baseline:  Goal status: INITIAL  2.  Patient will report 70% improvement overall  Baseline:  Goal status: INITIAL  3.   Patient will increase left leg MMT's to 5/5 to allow navigation of steps without gait deviation or loss of balance  Baseline:  Goal status: INITIAL  4.  Patient will increase left knee mobility to -2 to 120 to promote normal navigation of steps; step over step pattern  Baseline:  Goal status: INITIAL  5.  Patient will improve LEFS score by 9 points to demonstrate improved perceived function  Baseline: 41/80 Goal status: INITIAL  6.  Patient will increase her distance on 2 MWT by 35 ft to demonstrate improved functional mobility with community ambulation Baseline: 465 ft Goal status: INITIAL   PLAN:  PT FREQUENCY: 2x/week  PT DURATION: 4 weeks  PLANNED INTERVENTIONS: 97164- PT Re-evaluation, 97110-Therapeutic exercises, 97530- Therapeutic activity, 97112- Neuromuscular re-education, 97535- Self Care, 02859- Manual therapy, [id number]- Gait training, [phone]- Orthotic Fit/training, [phone]- Canalith repositioning, [id number]- Aquatic Therapy, 97760- Splinting, [phone]- Wound care (first 20 sq cm), 97598- Wound care (each additional 20 sq cm)Patient/Family education, Balance training, Stair training, Taping, Dry Needling, Joint mobilization, Joint manipulation, Spinal manipulation, Spinal mobilization, Scar mobilization, and  DME instructions.   PLAN FOR NEXT SESSION: Re-evaluation next session.  Plans to have right knee scope 9/22  8:00 AM, 12/06/23 Greig KATHEE Fuse, PTA/CLT [facility] Ph: [phone]

## 2023-12-09 ENCOUNTER — Ambulatory Visit (HOSPITAL_COMMUNITY)

## 2023-12-09 DIAGNOSIS — M25562 Pain in left knee: Secondary | ICD-10-CM | POA: Diagnosis not present

## 2023-12-09 DIAGNOSIS — R2689 Other abnormalities of gait and mobility: Secondary | ICD-10-CM

## 2023-12-09 DIAGNOSIS — M25662 Stiffness of left knee, not elsewhere classified: Secondary | ICD-10-CM

## 2023-12-09 NOTE — Therapy (Signed)
 OUTPATIENT PHYSICAL THERAPY LOWER EXTREMITY TREATMENT/DISCHARGE PHYSICAL THERAPY DISCHARGE SUMMARY  Visits from Start of Care: 7  Current functional level related to goals / functional outcomes: See below   Remaining deficits: See below   Education / Equipment: HEP   Patient agrees to discharge. Patient goals were met. Patient is being discharged due to meeting the stated rehab goals. Met 7/8 goals; missed 2 MWT goal by 11 ft.    Patient Name: Erica Fox MRN: [id number] DOB:September 04, 1985, 38 y.o., female Today's Date: 12/09/2023  END OF SESSION:  PT End of Session - 12/09/23 0725     Visit Number 7    Number of Visits 8    Date for Recertification  12/08/23    Authorization Type UHC    Authorization - Number of Visits 60    PT Start Time 0722    PT Stop Time 0800    PT Time Calculation (min) 38 min    Activity Tolerance Patient tolerated treatment well    Behavior During Therapy [facility] for tasks assessed/performed           Past Medical History:  Diagnosis Date   ADHD (attention deficit hyperactivity disorder) 2025   per patient   Anemia    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   left eye, dx by opthalmology   Degenerative joint disease    OCD (obsessive compulsive disorder) 2025   per patient   PTSD (post-traumatic stress disorder) 2025   per patient   Rheumatoid arthritis [facility])    Past Surgical History:  Procedure Laterality Date   CESAREAN SECTION     2007, 2012   EYE SURGERY Right    age 30, tighten muscle for lazy eye   FOOT SURGERY Left    hammertoe correction & screws placed (2 surgeries on left foot)   FOOT SURGERY Right 05/07/2022   hammertoe correction   TOTAL KNEE ARTHROPLASTY Left 07/28/2023   Procedure: ARTHROPLASTY, KNEE, TOTAL;  Surgeon: Addie Cordella Hamilton, MD;  Location: [facility] OR;  Service: Orthopedics;  Laterality: Left;   TUBAL LIGATION     WISDOM TOOTH EXTRACTION  2014   Patient Active Problem List   Diagnosis Date Noted   Chronic  pain syndrome 08/17/2023   S/P total knee replacement, left 07/28/2023   Rosacea 02/02/2023   Seasonal allergies 08/26/2022   Rheumatoid arthritis of multiple sites with negative rheumatoid factor (HCC) 07/22/2022    PCP: Lendia Boby CROME, NP-C  REFERRING PROVIDER: Addie Cordella Hamilton, MD  REFERRING DIAG: [phone] (ICD-10-CM) - S/P total knee arthroplasty, left  THERAPY DIAG:  Acute pain of left knee  Stiffness of left knee, not elsewhere classified  Other abnormalities of gait and mobility  Rationale for Evaluation and Treatment: Rehabilitation  ONSET DATE: 07/28/23  SUBJECTIVE:   SUBJECTIVE STATEMENT: 90% better overall; scheduled for surgery on her right knee on Monday morning  Evaluation:  S/p left TKA 07/28/23 per Dr. Addie; started therapy in [place] at [facility].  Had to stop after 8 visit due to insurance lapse; now is on her husbands insurance and can resume PT.  Was supposed to home home health initially but they never showed up so started out 3 weeks behind.  Diagnosed with JRA at 38 or 38 years old.  Supposed to have right knee scoped 12/12/23  per Dr. Addie.     PERTINENT HISTORY: ADHD OCD PTSD Anemia RA Has a pain management MD PAIN:  Are you having pain? Yes: NPRS scale: 0 pain currently up to 1  or 2 max Pain location: left knee Pain description: stiff and uncomfortable Aggravating factors: prolonged walking, stiff in the mornings, prolonging walking Relieving factors: movement  PRECAUTIONS: None  RED FLAGS: None   WEIGHT BEARING RESTRICTIONS: No  FALLS:  Has patient fallen in last 6 months? 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  DIAGNOSTIC FINDINGS: had MRI of both knees  in April; see under imaging  PATIENT SURVEYS:  LEFS  Extreme difficulty/unable (0), Quite a bit of difficulty (1), Moderate difficulty (2), Little difficulty (3), No difficulty (4) Survey date:    Any of your usual work, housework or school activities   2. Usual hobbies, recreational or sporting activities   3. Getting into/out of the bath   4. Walking between rooms   5. Putting on socks/shoes   6. Squatting    7. Lifting an object, like a bag of groceries from the floor   8. Performing light activities around your home   9. Performing heavy activities around your home   10. Getting into/out of a car   11. Walking 2 blocks   12. Walking 1 mile   13. Going up/down 10 stairs (1 flight)   14. Standing for 1 hour   15.  sitting for 1 hour   16. Running on even ground   17. Running on uneven ground   18. Making sharp turns while running fast   19. Hopping    20. Rolling over in bed   Score total:  41/80; 51.2 %     COGNITION: Overall cognitive status: Within functional limits for tasks assessed     SENSATION: WFL  EDEMA:     PALPATION: General tenderness  LOWER EXTREMITY ROM:  Active ROM Right eval Left eval Left 11/24/23 Left 12/09/23  Hip flexion      Hip extension      Hip abduction      Hip adduction      Hip internal rotation      Hip external rotation      Knee flexion 124 108 115 116 AROM, 120 AAROM  Knee extension 0 -5 -2 0  Ankle dorsiflexion      Ankle plantarflexion      Ankle inversion      Ankle eversion       (Blank rows = not tested)  LOWER EXTREMITY MMT:  MMT Right eval Left eval Left 12/09/23  Hip flexion 4+ 5 5  Hip extension 4+ 5   Hip abduction 5 5   Hip adduction     Hip internal rotation     Hip external rotation     Knee flexion 5 4+ in avail ROM in prone 5 in sitting  Knee extension 5 4+ 5  Ankle dorsiflexion 5 5 5   Ankle plantarflexion     Ankle inversion     Ankle eversion      (Blank rows = not tested)   FUNCTIONAL  TESTS:  5 times sit to stand: 14.37 sec 2 minute walk test: 465 ft without AD 11/15/23; SLS:  Rt: 14, Lt 8 max of 2 tries  GAIT: Distance walked: 465 ft no AD Assistive device utilized: None Level of assistance: Modified independence Comments: slight  decreased gait speed                                                                                                                                TREATMENT DATE:  12/09/23 Bike seat 15 x 5' dynamic warm up Reassess AROM and MMT's see above LEFS 64/80 80% 5 times sit to stand 8.79 sec no UE assist 2 MWT 489 ft no AD Left knee drives for flexion x 3' Slant board 5 x 20 Heel raises x 20 Squats x 20 Toe raises on decline x 20 8' manual scar massage left knee and  AAROM left knee flexion 120    12/06/23: Bike seat 15 x 5' for mobility; full revolutions Standing:  Left Knee drives flexion on 2nd staircase step 10X10  Heel raises 20x incline slope  Toe raises decline slope 20x Slant baord stretch 5X20 Seated:  Bodycraft leg press 8 pl X10 bil, Lt LE only 6 Pl 2X10  Cybex hamstring curls 6pl X10 bil, Lt only 4 Pl 2X10 Supine: manual proximal scar, scar massage   Heel slide 10X  SAQ with bolster 10X5  AROM 2-115 after manual  11/29/23 Bike seat 15 x 5' for mobility; dynamic warm up level 3 Left knee drives for flexion x 3' Standing: Heel raises on incline x 20 Toe raises on decline x 20 Slant board 5 x 20 Leg press 6 plates 2 legs 2 x 10; 6 plates left leg only x 10 Squats to chair for target 2 x 10 Cybex hamstring curls 4 plates both concentric and left only eccentric 2 x 10 Tandem stance on foam  2 x 30 each AAROM left knee flexion in supine 120 degrees  9/4/235: Bike seat 15 x 5' for mobility; full revolutions Standing:  Left Knee drives flexion on 12 step x 10X10  Heel raises 20x incline slope  Toe raises decline slope 20x  TKE GTB 10x 5; 2 sets Lt knee  Lt knee hamstring curl 10x Seated:  Lt hamstring  curls on treadmill 20X Manual: Prone contract relax 2X Lt knee flexion  MFR surrounding knee incision, mostly proximal scar Lt knee Supine:  Heel slide 10X  SAQ with bolster 10X5  AROM 2-115 after manual   9/3/235: Bike seat 15 x 5' for mobility; full revolutions Standing:  -Left Knee drives flexion on 12 step x 10X10  -TKE RTB 10x 5  - Heel raises 10x incline slope  - Toe raises decline slope 10x  - Lt knee hamstring curl 10x Prone:  - Hamstring curl 10x  - Quad stretch 3x 30 Manual:  MFR surrounding knee incision Supine:  - Heel slide 10  - Bridge 10x  - AROM 2-109 PROM 112   PATIENT EDUCATION:  Education details: Patient educated on exam findings, POC, scope of PT, HEP, and what to expect next visit. Person educated: Patient Education method: Explanation, Demonstration, and Handouts Education comprehension: verbalized understanding, returned demonstration, verbal  cues required, and tactile cues required  HOME EXERCISE PROGRAM: [email or web address]  Access Code: [email or web address] URL: [email or web address] Date: 11/10/2023 Prepared by: AP - Rehab  Exercises - Seated Knee Flexion AAROM  - 3 x daily - 7 x weekly - 2-3 sets - 1 reps - 35m hold - Active Straight Leg Raise with Quad Set  - 1 x daily - 7 x weekly - 3 sets - 10 reps - Supine Quad Set  - 1-3 x daily - 7 x weekly - 2-3 sets - 10 reps - 10s hold - Supine Isometric Hamstring Set  - 1 x daily - 7 x weekly - 2-3 sets - 10 reps - 10s hold - Supine Heel Slide  - 1 x daily - 7 x weekly - 1 sets - 10 reps - Heel Raises with Counter Support  - 1 x daily - 7 x weekly - 1 sets - 10 reps - Sit to Stand Without Arm Support  - 1 x daily - 7 x weekly - 1 sets - 10 reps - knee bends on the step using rails to hold onto  - 1 x daily - 7 x weekly - 1 sets - 10 reps  ASSESSMENT:  CLINICAL IMPRESSION Progress note today for discharge as she is having surgery Monday on her right knee.  Patient has met 7/8 goals and is ready for  discharge  OBJECTIVE IMPAIRMENTS: Abnormal gait, difficulty walking, decreased ROM, increased fascial restrictions, impaired perceived functional ability, and pain.   ACTIVITY LIMITATIONS: bending, squatting, and locomotion level  PARTICIPATION LIMITATIONS: meal prep, cleaning, shopping, community activity, and occupation  PERSONAL FACTORS: RA are also affecting patient's functional outcome.   REHAB POTENTIAL: Good  CLINICAL DECISION MAKING: Evolving/moderate complexity  EVALUATION COMPLEXITY: Moderate   GOALS: Goals reviewed with patient? Yes  SHORT TERM GOALS: Target date: 11/24/2023 patient will be independent with initial HEP  Baseline: Goal status: met  2.  Patient will report 50% improvement overall  Baseline:  Goal status: met   LONG TERM GOALS: Target date: 12/08/2023  Patient will be independent in self management strategies to improve quality of life and functional outcomes.  Baseline:  Goal status: met  2.  Patient will report 70% improvement overall  Baseline:  Goal status: met  3.   Patient will increase left leg MMT's to 5/5 to allow navigation of steps without gait deviation or loss of balance  Baseline: see above Goal status: met  4.  Patient will increase left knee mobility to -2 to 120 to promote normal navigation of steps; step over step pattern  Baseline: see above Goal status: met  5.  Patient will improve LEFS score by 9 points to demonstrate improved perceived function  Baseline: 41/80; 64/80 80% Goal status: met  6.  Patient will increase her distance on 2 MWT by 35 ft to demonstrate improved functional mobility with community ambulation Baseline: 465 ft; 489 ft (24 ft improvement) Goal status: in progress   PLAN:  PT FREQUENCY: 2x/week  PT DURATION: 4 weeks  PLANNED INTERVENTIONS: 97164- PT Re-evaluation, 97110-Therapeutic exercises, 97530- Therapeutic activity, 97112- Neuromuscular re-education, 97535- Self Care, 02859-  Manual therapy, [id number]- Gait training, [phone]- Orthotic Fit/training, [phone]- Canalith repositioning, [id number]- Aquatic Therapy, 97760- Splinting, [phone]- Wound care (first 20 sq cm), 97598- Wound care (each additional 20 sq cm)Patient/Family education, Balance training, Stair training, Taping, Dry Needling, Joint mobilization, Joint manipulation, Spinal manipulation, Spinal mobilization, Scar mobilization, and DME instructions.   PLAN FOR NEXT SESSION: discharge; will have right  knee scope 9/22  7:26 AM, 12/09/23 Nillie Bartolotta Small Jayln Madeira MPT [facility] [place] [phone]

## 2023-12-12 ENCOUNTER — Encounter: Payer: Self-pay | Admitting: Orthopedic Surgery

## 2023-12-12 ENCOUNTER — Other Ambulatory Visit: Payer: Self-pay | Admitting: Surgical

## 2023-12-12 DIAGNOSIS — S83231A Complex tear of medial meniscus, current injury, right knee, initial encounter: Secondary | ICD-10-CM | POA: Diagnosis not present

## 2023-12-12 HISTORY — PX: KNEE ARTHROSCOPY: SUR90

## 2023-12-12 MED ORDER — CELECOXIB 100 MG PO CAPS: 100.0000 mg | ORAL_CAPSULE | Freq: Two times a day (BID) | ORAL | 0 refills | Status: DC

## 2023-12-12 MED ORDER — OXYCODONE HCL 5 MG PO TABS: 5.0000 mg | ORAL_TABLET | ORAL | 0 refills | Status: DC | PRN

## 2023-12-12 MED ORDER — METHOCARBAMOL 500 MG PO TABS: 500.0000 mg | ORAL_TABLET | Freq: Three times a day (TID) | ORAL | 1 refills | Status: AC | PRN

## 2023-12-12 MED ORDER — ASPIRIN 81 MG PO CHEW
81.0000 mg | CHEWABLE_TABLET | Freq: Two times a day (BID) | ORAL | 0 refills | Status: AC
Start: 2023-12-12 — End: 2024-01-11

## 2023-12-14 ENCOUNTER — Encounter: Admitting: Orthopedic Surgery

## 2023-12-17 ENCOUNTER — Telehealth: Payer: Self-pay | Admitting: Surgical

## 2023-12-17 ENCOUNTER — Encounter: Payer: Self-pay | Admitting: Orthopedic Surgery

## 2023-12-17 NOTE — Telephone Encounter (Signed)
 Patient has sent MyChart messages about increased postop pain.  She came into the office on Saturday, 12/17/2023 for evaluation.  She denies any chest pain, shortness of breath, calf pain.  States that today, her knee pain is actually the best that it has been in several days.  She has been struggling with knee range of motion but on exam today she does have extension to about 10 degrees and flexion to 90 degrees.  She is able to perform straight leg raise.  No calf tenderness.  Negative Homans' sign.  Palpable PT pulse.  Intact ankle dorsiflexion and plantarflexion.  Incisions look to be healing well without evidence of infection or dehiscence.  The sutures in the lateral portal incision looks ready to be removed and these were removed but the medial portal incisions were left in.  The left knee does have effusion so this was aspirated of about 35 cc of sanguinous fluid.  No evidence of purulence.  Toradol injection administered consisting of 4 cc of Marcaine  mixed with 1 cc of Toradol.  Patient tolerated procedure well without complication.  She has upcoming follow-up appointment with Dr. Addie on Wednesday and based on her recent improvement today, seems that she is trending better.  There is no red flag symptoms and no need for ultrasound to rule out DVT or any other intervention at this time.

## 2023-12-19 ENCOUNTER — Other Ambulatory Visit: Payer: Self-pay | Admitting: Surgical

## 2023-12-19 ENCOUNTER — Ambulatory Visit: Admitting: Orthopedic Surgery

## 2023-12-19 MED ORDER — OXYCODONE HCL 5 MG PO TABS: 5.0000 mg | ORAL_TABLET | ORAL | 0 refills | Status: DC | PRN

## 2023-12-21 ENCOUNTER — Ambulatory Visit (INDEPENDENT_AMBULATORY_CARE_PROVIDER_SITE_OTHER): Admitting: Orthopedic Surgery

## 2023-12-21 DIAGNOSIS — M25561 Pain in right knee: Secondary | ICD-10-CM

## 2023-12-21 DIAGNOSIS — G8929 Other chronic pain: Secondary | ICD-10-CM

## 2023-12-22 ENCOUNTER — Encounter: Payer: Self-pay | Admitting: Orthopedic Surgery

## 2023-12-22 NOTE — Progress Notes (Unsigned)
   Post-Op Visit Note   Patient: Erica Fox           Date of Birth: [birth year]           MRN: [id number] Visit Date: 12/21/2023 PCP: Lendia Boby CROME, NP-C   Assessment & Plan:  Chief Complaint:  Chief Complaint  Patient presents with   Right Knee - Routine Post Op    RIGHT KNEE SCOPE (surgery date 12-12-23)   Visit Diagnoses:  1. Chronic pain of right knee     Plan: Patient underwent right knee arthroscopy and partial medial meniscectomy on 12/12/2023.  Reports some medial soreness.  She did see Herlene on Saturday morning and the lateral sutures were removed and the knee was aspirated of 30 cc and injected with Toradol which has been helpful.  She does see rheumatology at the end of the month.  On examination she has full extension and flexion easily past 90.  No effusion today.  Quad strength improving.  No calf tenderness negative Homans.  Plan at this time is 4-week return for clinical recheck.  Stationary bike encouraged for strengthening.  Follow-Up Instructions: No follow-ups on file.   Orders:  No orders of the defined types were placed in this encounter.  No orders of the defined types were placed in this encounter.   Imaging: No results found.  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   left eye, dx by opthalmology   Degenerative joint disease    OCD (obsessive compulsive disorder) 2025   per patient   PTSD (post-traumatic stress disorder) 2025   per patient   Rheumatoid arthritis (HCC)     Family History  Problem Relation Age of Onset   Heart  disease Mother    Breast cancer Paternal Aunt    Healthy Son    Healthy Son     Past Surgical History:  Procedure Laterality Date   CESAREAN SECTION     2007, 2012   EYE SURGERY Right    age 28, tighten muscle for lazy eye   FOOT SURGERY Left    hammertoe correction & screws placed (2 surgeries on left foot)   FOOT SURGERY Right 05/07/2022   hammertoe correction   TOTAL KNEE ARTHROPLASTY Left 07/28/2023   Procedure: ARTHROPLASTY, KNEE, TOTAL;  Surgeon: Addie Cordella Hamilton, MD;  Location: [facility] OR;  Service: Orthopedics;  Laterality: Left;   TUBAL LIGATION     WISDOM TOOTH EXTRACTION  2014   Social History   Occupational History   Not on file  Tobacco Use   Smoking status: Never    Passive exposure: Past   Smokeless tobacco: Never  Vaping Use   Vaping status: Never Used  Substance and Sexual Activity   Alcohol use: Yes    Comment: maybe a drink once every 2 months   Drug use: Not Currently   Sexual activity: Yes    Partners: Male    Birth control/protection: I.U.D., Surgical    Comment: tubal, Mirena , menarche 38yo, sexual debut 38yo

## 2023-12-25 ENCOUNTER — Encounter: Payer: Self-pay | Admitting: Orthopedic Surgery

## 2023-12-26 NOTE — Telephone Encounter (Signed)
I have replied.

## 2023-12-30 ENCOUNTER — Other Ambulatory Visit: Payer: Self-pay | Admitting: Medical Genetics

## 2023-12-30 DIAGNOSIS — Z006 Encounter for examination for normal comparison and control in clinical research program: Secondary | ICD-10-CM

## 2024-01-04 NOTE — Progress Notes (Signed)
 Office Visit Note  Patient: Erica Fox             Date of Birth: 01/13/1986           MRN: [id number]             PCP: Lendia Boby CROME, NP-C Referring: Lendia Boby CROME, NP-C Visit Date: 01/18/2024 Occupation: Data Unavailable  Subjective:  Pain in both feet   History of Present Illness: Erica Fox is a 38 y.o. female with history of seronegative rheumatoid arthritis.  Patient reports that she has been off of Rasuvo  and Rinvoq  since June 2025.  Patient states that she was having significant pain and difficulty with the Rasuvo  injections.  Patient states that when she came off of Rasuvo  she noticed weight loss.  She was unsure if she was able to take her invoke without Rasuvo  so she has not resumed taking her invoke yet.  Patient states that she underwent right knee arthroscopic surgery on 12/12/2023 by Dr. Addie.  She continues to have discomfort and is aware that she will likely require a right knee replacement in the future.  She is scheduled to follow-up with Dr. Addie on 01/20/2024.  Patient states that her left knee replacement is doing well.  Patient states that she decided to get a second job since she thought she was doing better and that her mobility had improved.  Patient states that she is been experiencing exacerbation of pain involving both feet.  Patient states that the pain is most severe on the dorsal aspect.  She has been experiencing swelling as well as difficulty standing walking prolonged distances.  Patient states that she has tried wearing Hoka's but her discomfort persists.  She has also had discomfort along the right scapular border.  She takes tramadol  as needed for pain relief.  She has been using a TENS unit at home.   Patient states that she has noticed increased breathlessness and shortness of breath especially with exertion.  Patient has been concerned about possible asthma.   Activities of Daily Living:  Patient reports morning stiffness for 24 hours.   Patient  Reports nocturnal pain.  Difficulty dressing/grooming: Reports Difficulty climbing stairs: Reports Difficulty getting out of chair: Reports Difficulty using hands for taps, buttons, cutlery, and/or writing: Reports  Review of Systems  Constitutional:  Positive for fatigue.  HENT:  Negative for mouth sores and mouth dryness.   Eyes:  Negative for dryness.  Respiratory:  Negative for shortness of breath.   Cardiovascular:  Negative for chest pain and palpitations.  Gastrointestinal:  Positive for constipation and diarrhea. Negative for blood in stool.  Endocrine: Negative for increased urination.  Genitourinary:  Negative for involuntary urination.  Musculoskeletal:  Positive for joint pain, joint pain, joint swelling, myalgias, muscle weakness, morning stiffness, muscle tenderness and myalgias. Negative for gait problem.  Skin:  Negative for color change, rash, hair loss and sensitivity to sunlight.  Allergic/Immunologic: Negative for susceptible to infections.  Neurological:  Positive for dizziness and headaches.  Hematological:  Negative for swollen glands.  Psychiatric/Behavioral:  Positive for depressed mood. Negative for sleep disturbance. The patient is nervous/anxious.     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   left eye, dx by opthalmology   Degenerative joint disease    OCD (obsessive compulsive disorder) 2025   per patient   PTSD (post-traumatic stress disorder) 2025   per patient   Rheumatoid arthritis (HCC)     Family History  Problem Relation Age  of Onset   Heart disease Mother    Breast cancer Paternal Aunt    Healthy Son    Healthy Son    Past Surgical History:  Procedure Laterality Date   CESAREAN SECTION     2007, 2012   EYE SURGERY Right    age 43, tighten muscle for lazy eye   FOOT SURGERY Left    hammertoe correction & screws placed (2 surgeries on left foot)   FOOT SURGERY Right 05/07/2022   hammertoe correction   KNEE ARTHROSCOPY Right 12/12/2023   TOTAL KNEE ARTHROPLASTY Left 07/28/2023   Procedure: ARTHROPLASTY, KNEE, TOTAL;  Surgeon: Addie Cordella Hamilton, MD;  Location: [facility] OR;  Service: Orthopedics;  Laterality: Left;   TUBAL LIGATION     WISDOM TOOTH EXTRACTION  2014   Social History   Tobacco Use   Smoking status: Never    Passive exposure: Past   Smokeless tobacco: Never  Vaping Use   Vaping status: Never Used  Substance Use Topics   Alcohol use: Yes    Comment: maybe a drink once every 2 months   Drug use: Not Currently   Social History   Social History Narrative   Not on file      There is no immunization history on file for this patient.   Objective: Vital Signs: BP 123/82   Pulse 72   Temp 97.9 F (36.6 C)   Resp 14   Ht 5' 8 (1.727 m)   Wt 267 lb 6.4 oz (121.3 kg)   BMI 40.66 kg/m    Physical Exam Vitals and nursing note reviewed.  Constitutional:      Appearance: She is well-developed.  HENT:     Head: Normocephalic and atraumatic.  Eyes:     Conjunctiva/sclera: Conjunctivae normal.  Cardiovascular:     Rate and Rhythm: Normal rate and regular rhythm.     Heart sounds: Normal heart sounds.  Pulmonary:     Effort: Pulmonary effort is normal.     Breath sounds: Normal breath sounds.  Abdominal:     General: Bowel sounds are normal.     Palpations: Abdomen is soft.  Musculoskeletal:     Cervical back: Normal range of motion.  Lymphadenopathy:     Cervical: No cervical adenopathy.  Skin:    General: Skin is warm and dry.     Capillary Refill: Capillary refill  takes less than 2 seconds.  Neurological:     Mental Status: She is alert and oriented to person, place, and time.  Psychiatric:        Behavior: Behavior normal.      Musculoskeletal Exam: Patient remained seated during examination today.  C-spine has limited range of motion without rotation.  Trapezius muscle tension and tenderness bilaterally, right more severe than left.  Shoulder joints and elbow joints have good range of motion.  Severely limited range of motion of both wrist joints.  No tenderness or synovitis over MCP joints.  Synovial thickening of all MCP joints and PIP joints.  Warmth and discomfort range of motion of the right knee.  Left knee replacement has warmth but has good range of motion.  Ankle joints have limited range of motion.  Tenderness and inflammation noted on the dorsal aspect of both feet.  CDAI Exam: CDAI Score: -- Patient Global: --; Provider Global: -- Swollen: --; Tender: -- Joint Exam 01/18/2024   No joint exam has been documented for this visit   There is currently no information documented on the homunculus. Go to the Rheumatology activity and complete the homunculus joint exam.  Investigation: No additional findings.  Imaging: No results found.  Recent Labs: Lab Results  Component Value Date   WBC 8.3 01/06/2024   HGB 13.0 01/06/2024   PLT 205.0 01/06/2024   NA 138 01/06/2024   K 3.8 01/06/2024   CL 108 01/06/2024   CO2 23 01/06/2024   GLUCOSE 98 01/06/2024   BUN 11 01/06/2024   CREATININE 0.66 01/06/2024   BILITOT 0.7 01/06/2024   ALKPHOS 72 01/06/2024   AST 13 01/06/2024   ALT 11 01/06/2024   PROT 8.0 01/06/2024   ALBUMIN 4.5 01/06/2024   CALCIUM 8.9 01/06/2024   QFTBGOLDPLUS NEGATIVE 10/12/2023    Speciality Comments: Enbrel-discontinued Humira-inadequate response Simponi  started October 05, 2022 Methotrexate  started August 26, 2022  Procedures:  No procedures performed Allergies: Dilaudid  [hydromorphone  hcl] and Penicillins     Assessment / Plan:     Visit Diagnoses: Rheumatoid arthritis of multiple sites with negative rheumatoid factor (HCC) - RF negative, anti-CCP negative, MCV negative, elevated sedimentation rate. JIA at age 3.  Severe erosive rheumatoid arthritis: Patient presents today experiencing a flare involving both feet.  She is a difficulty ambulating due to severity of pain and joint stiffness.  She has noticed intermittent swelling in both feet.  She has been off of both Rasuvo  and Rinvoq  since June 2025.  Patient is having pain at the injection site and difficulty with Rasuvo  self-injections.  She also reports losing about 30 pounds since discontinuing Rasuvo .  She was unsure if she was able to take Rinvoq  without Rasuvo  so she has also been holding Rinvoq .  Discussed the importance of resuming Rinvoq  15 mg 1 tablet daily.  To alleviate her current symptoms a prescription for Celebrex  100 mg 1 capsule twice daily as needed was sent to the pharmacy today.  Plan to try to avoid the use of prednisone . She will notify us  if the symptoms do not improve.  She will continue to require close follow up.  She will follow-up in the office in 3 months or sooner if needed.  High risk medication use - Rinvoq  15 mg 1 tablet by mouth daily, Rasuvo  25 mg sq injections once weekly, and folic acid  2 mg daily.Patient initiated Rinvoq  on 01/08/2023 CBC and CMP updated on 01/06/24. Her next lab work will be due in January and every 3 months.  TB gold negative on 10/12/23  Lipid panel updated on 03/03/23.   Discussed the importance of holding rinvoq  and rasuvo  if she develops signs or symptoms of an infection and to resume once the infection has completely cleared.    Pain in both hands: Synovial thickening of all MCP joints but no synovitis noted.  Severely limited range of motion of both wrist joints.  Primary osteoarthritis of right knee: Underwent arthroscopic surgery on 12/12/2023.  Under the care of Dr. Addie.  Patient is  aware that she will likely require a knee replacement in the future.  S/P total knee replacement, left - Performed by Dr. Addie on 07/28/2023.  No complications.  Minimal to no pain after surgery.  Doing well.  Pain in both feet - Left achilles tendonitis-under the care of Dr. Silva.  Patient presents today with increased pain in both feet.  She had to call out of work this morning due to severity of symptoms.  She has been experiencing pain and swelling on the dorsal aspect of both feet as well as stiffness first thing in the mornings.  She has tried wearing Hoka's especially if she is having to stand for prolonged periods of time at her second job.  Plan to resume Rinvoq  as discussed above.  Plan to also send a prescription for Celebrex  100 mg twice daily as needed for symptomatic relief.  Breathlessness on mild exertion: She has been experiencing breathlessness especially with exertion.  There has been concern about possible asthma.  She has not undergone a workup by pulmonology in the past.  Discussed the concern for developing interstitial lung disease in patients with rheumatoid arthritis.   Not experiencing any coughing at this time.  Lungs were clear to auscultation today.   Recommend further evaluation by pulmonology.  Plan to place referral to  pulmonary.   Other medical conditions are listed as follows:  JIA (juvenile idiopathic arthritis) (HCC) - Diagnosed at age 4.  Treated with different DMARDs and Biologics until age 35.  She stopped methotrexate  and Enbrel at age 63  Elevated sed rate  IgA deficiency (HCC)  Seasonal allergies  Other fatigue  Vitamin D  deficiency  PTSD (post-traumatic stress disorder)  History of ADHD  History of depression  Orders: Orders Placed This Encounter  Procedures   Ambulatory referral to Pulmonology   Meds ordered this encounter  Medications   celecoxib  (CELEBREX ) 100 MG capsule    Sig: Take 1 capsule (100 mg total) by mouth 2  (two) times daily.    Dispense:  60 capsule    Refill:  0    Follow-Up Instructions: Return in about 3 months (around 04/19/2024) for Rheumatoid arthritis.   Waddell CHRISTELLA Craze, PA-C  Note - This record has been created using Dragon software.  Chart creation errors have been sought, but may not always  have been located. Such creation errors do not reflect on  the standard of medical care.

## 2024-01-06 ENCOUNTER — Ambulatory Visit: Admitting: Family Medicine

## 2024-01-06 ENCOUNTER — Ambulatory Visit: Payer: Self-pay | Admitting: Family Medicine

## 2024-01-06 ENCOUNTER — Encounter: Payer: Self-pay | Admitting: Family Medicine

## 2024-01-06 ENCOUNTER — Other Ambulatory Visit (HOSPITAL_COMMUNITY): Payer: Self-pay

## 2024-01-06 VITALS — BP 128/82 | HR 79 | Temp 97.8°F | Ht 68.0 in | Wt 263.0 lb

## 2024-01-06 DIAGNOSIS — R195 Other fecal abnormalities: Secondary | ICD-10-CM | POA: Diagnosis not present

## 2024-01-06 DIAGNOSIS — R159 Full incontinence of feces: Secondary | ICD-10-CM

## 2024-01-06 DIAGNOSIS — R1032 Left lower quadrant pain: Secondary | ICD-10-CM

## 2024-01-06 DIAGNOSIS — G8929 Other chronic pain: Secondary | ICD-10-CM

## 2024-01-06 DIAGNOSIS — R634 Abnormal weight loss: Secondary | ICD-10-CM

## 2024-01-06 DIAGNOSIS — R152 Fecal urgency: Secondary | ICD-10-CM

## 2024-01-06 LAB — COMPREHENSIVE METABOLIC PANEL WITH GFR
ALT: 11 U/L (ref 0–35)
AST: 13 U/L (ref 0–37)
Albumin: 4.5 g/dL (ref 3.5–5.2)
Alkaline Phosphatase: 72 U/L (ref 39–117)
BUN: 11 mg/dL (ref 6–23)
CO2: 23 meq/L (ref 19–32)
Calcium: 8.9 mg/dL (ref 8.4–10.5)
Chloride: 108 meq/L (ref 96–112)
Creatinine, Ser: 0.66 mg/dL (ref 0.40–1.20)
GFR: 110.35 mL/min (ref >60.00)
Glucose, Bld: 98 mg/dL (ref 70–99)
Potassium: 3.8 meq/L (ref 3.5–5.1)
Sodium: 138 meq/L (ref 135–145)
Total Bilirubin: 0.7 mg/dL (ref 0.2–1.2)
Total Protein: 8 g/dL (ref 6.0–8.3)

## 2024-01-06 LAB — MAGNESIUM: Magnesium: 1.9 mg/dL (ref 1.5–2.5)

## 2024-01-06 LAB — TSH: TSH: 3.94 u[IU]/mL (ref 0.35–5.50)

## 2024-01-06 LAB — CBC WITH DIFFERENTIAL/PLATELET
Basophils Absolute: 0 K/uL (ref 0.0–0.1)
Basophils Relative: 0.5 % (ref 0.0–3.0)
Eosinophils Absolute: 0.3 K/uL (ref 0.0–0.7)
Eosinophils Relative: 3.5 % (ref 0.0–5.0)
HCT: 39.6 % (ref 36.0–46.0)
Hemoglobin: 13 g/dL (ref 12.0–15.0)
Lymphocytes Relative: 20.3 % (ref 12.0–46.0)
Lymphs Abs: 1.7 K/uL (ref 0.7–4.0)
MCHC: 32.7 g/dL (ref 30.0–36.0)
MCV: 85.6 fl (ref 78.0–100.0)
Monocytes Absolute: 0.6 K/uL (ref 0.1–1.0)
Monocytes Relative: 7.3 % (ref 3.0–12.0)
Neutro Abs: 5.7 K/uL (ref 1.4–7.7)
Neutrophils Relative %: 68.4 % (ref 43.0–77.0)
Platelets: 205 K/uL (ref 150.0–400.0)
RBC: 4.63 Mil/uL (ref 3.87–5.11)
RDW: 15 % (ref 11.5–15.5)
WBC: 8.3 K/uL (ref 4.0–10.5)

## 2024-01-06 LAB — T4, FREE: Free T4: 0.9 ng/dL (ref 0.60–1.60)

## 2024-01-06 NOTE — Progress Notes (Signed)
 Subjective:     Patient ID: Erica Fox, female    DOB: 11-12-1985, 38 y.o.   MRN: [id number]  Chief Complaint  Patient presents with   GI Problem    Urgency to go to the bathroom after she eats, has been going on and off for 2 years but worse within the last 6 months-year   Abdominal Pain    Lower left side still having pain    GI Problem The primary symptoms include weight loss and abdominal pain. Primary symptoms do not include fever, nausea, vomiting, diarrhea or dysuria.  The illness is also significant for constipation. The illness does not include chills.  Abdominal Pain Associated symptoms include constipation and weight loss. Pertinent negatives include no diarrhea, dysuria, fever, frequency, headaches, nausea or vomiting.    Discussed the use of AI scribe software for clinical note transcription with the patient, who gave verbal consent to proceed.  History of Present Illness Erica Fox is a 38 year old female who presents with new gastrointestinal symptoms.  Altered bowel habits - Intermittent urgency and inability to control bowel movements for two years, increasing in frequency and consistency over the past six months - Episodes occur three to four times per month, often after consuming cheeseburgers, cheese pizza, and chicken wings - Episodes accompanied by sweating and abdominal discomfort - Recent decrease in bowel movement frequency with constipation lasting up to one week, temporally associated with knee surgeries and pain medication use - Colace used for constipation relief - Loose stools follow periods of constipation  Abdominal pain - Chronic left-sided abdominal pain for the past year - Occasional sharp, shooting pains lasting approximately 30 seconds - No associated pelvic pain  Unintentional weight loss - Weight loss of 26-27 pounds since May - Attributed to reduced food intake following surgery and increased work-related busyness  Associated and  negative symptoms - No fever, chills, nausea, vomiting, or urinary symptoms     Health Maintenance Due  Topic Date Due   HIV Screening  Never done   DTaP/Tdap/Td (1 - Tdap) Never done   Pneumococcal Vaccine (1 of 2 - PCV) Never done   Hepatitis B Vaccines 19-59 Average Risk (1 of 3 - 19+ 3-dose series) Never done   HPV VACCINES (1 - Risk 3-dose SCDM series) Never done    Past Medical History:  Diagnosis Date   ADHD (attention deficit hyperactivity disorder) 2025   per patient   Anemia    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   left eye, dx by opthalmology   Degenerative joint disease    OCD (obsessive compulsive disorder) 2025   per patient   PTSD (post-traumatic stress disorder) 2025   per patient   Rheumatoid arthritis [facility])     Past Surgical History:  Procedure Laterality Date   CESAREAN SECTION     2007, 2012   EYE SURGERY Right    age 64, tighten muscle for lazy eye   FOOT SURGERY Left    hammertoe correction & screws placed (2 surgeries on left foot)   FOOT SURGERY Right 05/07/2022   hammertoe correction   TOTAL KNEE ARTHROPLASTY Left 07/28/2023   Procedure: ARTHROPLASTY, KNEE, TOTAL;  Surgeon: Addie Cordella Hamilton, MD;  Location: [facility] OR;  Service: Orthopedics;  Laterality: Left;   TUBAL LIGATION     WISDOM TOOTH EXTRACTION  2014    Family History  Problem Relation Age of Onset   Heart disease Mother    Breast cancer Paternal Aunt    Healthy  Son    Healthy Son     Social History   Socioeconomic History   Marital status: Married    Spouse name: Not on file   Number of children: 2   Years of education: Not on file   Highest education level: Associate degree: occupational, Scientist, product/process development, or vocational program  Occupational History   Not on file  Tobacco Use   Smoking status: Never    Passive exposure: Past   Smokeless tobacco: Never  Vaping Use   Vaping status: Never Used  Substance and Sexual Activity   Alcohol use: Yes    Comment: maybe a  drink once every 2 months   Drug use: Not Currently   Sexual activity: Yes    Partners: Male    Birth control/protection: I.U.D., Surgical    Comment: tubal, Mirena , menarche 38yo, sexual debut 38yo  Other Topics Concern   Not on file  Social History Narrative   Not on file   Social Drivers of Health   Financial Resource Strain: Medium Risk (01/05/2024)   Overall Financial Resource Strain (CARDIA)    Difficulty of Paying Living Expenses: Somewhat hard  Food Insecurity: No Food Insecurity (01/05/2024)   Hunger Vital Sign    Worried About Running Out of Food in the Last Year: Never true    Ran Out of Food in the Last Year: Never true  Transportation Needs: No Transportation Needs (01/05/2024)   PRAPARE - Administrator, Civil Service (Medical): No    Lack of Transportation (Non-Medical): No  Physical Activity: Inactive (01/05/2024)   Exercise Vital Sign    Days of Exercise per Week: 2 days    Minutes of Exercise per Session: 0 min  Stress: No Stress Concern Present (01/05/2024)   [employer] of Occupational Health - Occupational Stress Questionnaire    Feeling of Stress: Only a little  Social Connections: Socially Isolated (01/05/2024)   Social Connection and Isolation Panel    Frequency of Communication with Friends and Family: Never    Frequency of Social Gatherings with Friends and Family: Never    Attends Religious Services: Never    Database administrator or Organizations: No    Attends Engineer, structural: Not on file    Marital Status: Married  Catering manager Violence: Not At Risk (07/28/2023)   Humiliation, Afraid, Rape, and Kick questionnaire    Fear of Current or Ex-Partner: No    Emotionally Abused: No    Physically Abused: No    Sexually Abused: No    Outpatient Medications Prior to Visit  Medication Sig Dispense Refill   traMADol  (ULTRAM ) 50 MG tablet Take by mouth every 6 (six) hours as needed.     aspirin  (ASPIRIN  CHILDRENS) 81  MG chewable tablet Chew 1 tablet (81 mg total) by mouth 2 (two) times daily. To prevent blood clots (Patient not taking: Reported on 01/06/2024) 60 tablet 0   B Complex Vitamins (VITAMIN B COMPLEX PO) Take 1 capsule by mouth in the morning. (Patient not taking: Reported on 01/06/2024)     celecoxib  (CELEBREX ) 100 MG capsule Take 1 capsule (100 mg total) by mouth 2 (two) times daily. (Patient not taking: Reported on 01/06/2024) 60 capsule 0   Cholecalciferol (VITAMIN D -3 PO) Take 1,000 Units by mouth in the morning. (Patient not taking: Reported on 01/06/2024)     DULoxetine  (CYMBALTA ) 60 MG capsule Take 1 capsule (60 mg total) by mouth daily. (Patient not taking: Reported on 01/06/2024) 30 capsule 1  folic acid  (FOLVITE ) 1 MG tablet TAKE 2 TABLETS BY MOUTH EVERY DAY (Patient not taking: Reported on 01/06/2024) 180 tablet 3   methocarbamol  (ROBAXIN ) 500 MG tablet Take 1 tablet (500 mg total) by mouth every 8 (eight) hours as needed for muscle spasms. (Patient not taking: Reported on 01/06/2024) 30 tablet 1   RASUVO  25 MG/0.5ML SOAJ INJECT THE CONTENTS OF ONE  AUTO-INJECTOR SUBCUTANEOUSLY  WEEKLY AS DIRECTED (Patient not taking: Reported on 01/06/2024) 6 mL 0   Upadacitinib  ER (RINVOQ ) 15 MG TB24 Take 1 tablet (15 mg total) by mouth daily. (Patient not taking: Reported on 01/06/2024) 90 tablet 0   oxyCODONE  (ROXICODONE ) 5 MG immediate release tablet Take 1 tablet (5 mg total) by mouth every 4 (four) hours as needed for severe pain (pain score 7-10). 30 tablet 0   No facility-administered medications prior to visit.    Allergies  Allergen Reactions   Dilaudid  [Hydromorphone  Hcl] Itching and Rash   Penicillins Other (See Comments)    Adopted mother informed her of allergy  but unaware of reaction.    Review of Systems  Constitutional:  Positive for weight loss. Negative for chills and fever.  Respiratory:  Negative for shortness of breath.   Cardiovascular:  Negative for chest pain, palpitations  and leg swelling.  Gastrointestinal:  Positive for abdominal pain and constipation. Negative for diarrhea, nausea and vomiting.       Intermittent loose stools and constipation   Genitourinary:  Negative for dysuria, frequency and urgency.  Neurological:  Negative for dizziness, focal weakness and headaches.       Objective:    Physical Exam Constitutional:      General: She is not in acute distress.    Appearance: She is obese. She is not ill-appearing.  HENT:     Mouth/Throat:     Mouth: Mucous membranes are moist.     Pharynx: Oropharynx is clear.  Eyes:     Extraocular Movements: Extraocular movements intact.     Conjunctiva/sclera: Conjunctivae normal.     Pupils: Pupils are equal, round, and reactive to light.  Cardiovascular:     Rate and Rhythm: Normal rate and regular rhythm.  Pulmonary:     Effort: Pulmonary effort is normal.     Breath sounds: Normal breath sounds.  Abdominal:     General: Bowel sounds are normal. There is no distension.     Palpations: Abdomen is soft.     Tenderness: There is abdominal tenderness in the left lower quadrant. There is no guarding or rebound. Negative signs include Murphy's sign and McBurney's sign.  Musculoskeletal:     Cervical back: Normal range of motion and neck supple. No tenderness.     Right lower leg: No edema.     Left lower leg: No edema.  Lymphadenopathy:     Cervical: No cervical adenopathy.  Skin:    General: Skin is warm and dry.  Neurological:     General: No focal deficit present.     Mental Status: She is alert and oriented to person, place, and time.  Psychiatric:        Mood and Affect: Mood normal.        Behavior: Behavior normal.        Thought Content: Thought content normal.      BP 128/82   Pulse 79   Temp 97.8 F (36.6 C) (Temporal)   Ht 5' 8 (1.727 m)   Wt 263 lb (119.3 kg)   SpO2 99%  BMI 39.99 kg/m  Wt Readings from Last 3 Encounters:  01/06/24 263 lb (119.3 kg)  10/12/23 260 lb  (117.9 kg)  08/17/23 272 lb (123.4 kg)       Assessment & Plan:   Problem List Items Addressed This Visit   None Visit Diagnoses       Incontinence of feces with fecal urgency    -  Primary   Relevant Orders   CBC with Differential/Platelet (Completed)   Comprehensive metabolic panel with GFR (Completed)   TSH (Completed)   T4, free (Completed)   GI Profile, Stool, PCR   Ambulatory referral to Gastroenterology     Chronic left lower quadrant pain       Relevant Medications   traMADol  (ULTRAM ) 50 MG tablet   Other Relevant Orders   Ambulatory referral to Gastroenterology     Loose stools       Relevant Orders   TSH (Completed)   T4, free (Completed)   Magnesium (Completed)   GI Profile, Stool, PCR   Ambulatory referral to Gastroenterology     Abnormal weight loss       Relevant Orders   CBC with Differential/Platelet (Completed)   Comprehensive metabolic panel with GFR (Completed)   TSH (Completed)   T4, free (Completed)       Assessment and Plan Assessment & Plan Bowel incontinence and urgency with intermittent constipation and chronic left-sided abdominal pain Chronic bowel incontinence and urgency with episodes of constipation and left-sided abdominal pain for two years, with increased consistency over the last six months. Recent constipation likely related to pain medication post-knee surgery. Left-sided abdominal pain suggests gastrointestinal rather than gynecological origin. Differential includes dietary triggers, though no clear pattern identified. No fever, chills, nausea, vomiting, or pelvic pain. No recent gastroenterology evaluation. - Refer to gastroenterology for further evaluation. - Order stool study to assess for potential gastrointestinal issues. - Order labs as she is due for them. - Advise to contact the office if symptoms worsen or new symptoms develop.  Abnormal weight loss Weight loss of approximately 26-27 pounds since May, attributed to  decreased food intake post-surgery and busy work schedule. No intentional dietary changes reported. Weight loss partially explained by reduced portion sizes and decreased meal frequency. - Monitor weight and dietary intake.     I have discontinued Yardley Chatham's oxyCODONE . I am also having her maintain her Cholecalciferol (VITAMIN D -3 PO), B Complex Vitamins (VITAMIN B COMPLEX PO), DULoxetine , Rasuvo , folic acid , Rinvoq , aspirin , celecoxib , methocarbamol , and traMADol .  No orders of the defined types were placed in this encounter.

## 2024-01-06 NOTE — Patient Instructions (Signed)
 Please go downstairs for labs before you leave  I will be in touch once I have your results.  I recommend a trial of avoiding dairy for 1 to 2 weeks and keeping a food journal that includes any gastrointestinal symptoms.   I referred you to [facility] gastroenterology  I recommend follow-up with your OB/GYN  If you have any new or worsening symptoms, let me know.

## 2024-01-13 ENCOUNTER — Other Ambulatory Visit (HOSPITAL_COMMUNITY): Payer: Self-pay

## 2024-01-18 ENCOUNTER — Encounter: Payer: Self-pay | Admitting: Physician Assistant

## 2024-01-18 ENCOUNTER — Ambulatory Visit: Attending: Orthopedic Surgery | Admitting: Physician Assistant

## 2024-01-18 ENCOUNTER — Other Ambulatory Visit (HOSPITAL_COMMUNITY): Payer: Self-pay

## 2024-01-18 VITALS — BP 123/82 | HR 72 | Temp 97.9°F | Resp 14 | Ht 68.0 in | Wt 267.4 lb

## 2024-01-18 DIAGNOSIS — R700 Elevated erythrocyte sedimentation rate: Secondary | ICD-10-CM | POA: Insufficient documentation

## 2024-01-18 DIAGNOSIS — M79672 Pain in left foot: Secondary | ICD-10-CM | POA: Insufficient documentation

## 2024-01-18 DIAGNOSIS — M79671 Pain in right foot: Secondary | ICD-10-CM | POA: Insufficient documentation

## 2024-01-18 DIAGNOSIS — M1711 Unilateral primary osteoarthritis, right knee: Secondary | ICD-10-CM | POA: Insufficient documentation

## 2024-01-18 DIAGNOSIS — M25462 Effusion, left knee: Secondary | ICD-10-CM

## 2024-01-18 DIAGNOSIS — D802 Selective deficiency of immunoglobulin A [IgA]: Secondary | ICD-10-CM | POA: Insufficient documentation

## 2024-01-18 DIAGNOSIS — R0681 Apnea, not elsewhere classified: Secondary | ICD-10-CM | POA: Insufficient documentation

## 2024-01-18 DIAGNOSIS — Z96652 Presence of left artificial knee joint: Secondary | ICD-10-CM | POA: Insufficient documentation

## 2024-01-18 DIAGNOSIS — M0609 Rheumatoid arthritis without rheumatoid factor, multiple sites: Secondary | ICD-10-CM | POA: Insufficient documentation

## 2024-01-18 DIAGNOSIS — M79641 Pain in right hand: Secondary | ICD-10-CM | POA: Insufficient documentation

## 2024-01-18 DIAGNOSIS — Z79899 Other long term (current) drug therapy: Secondary | ICD-10-CM | POA: Insufficient documentation

## 2024-01-18 DIAGNOSIS — Z8659 Personal history of other mental and behavioral disorders: Secondary | ICD-10-CM | POA: Insufficient documentation

## 2024-01-18 DIAGNOSIS — R5383 Other fatigue: Secondary | ICD-10-CM | POA: Insufficient documentation

## 2024-01-18 DIAGNOSIS — E559 Vitamin D deficiency, unspecified: Secondary | ICD-10-CM | POA: Insufficient documentation

## 2024-01-18 DIAGNOSIS — M79642 Pain in left hand: Secondary | ICD-10-CM | POA: Insufficient documentation

## 2024-01-18 DIAGNOSIS — J302 Other seasonal allergic rhinitis: Secondary | ICD-10-CM | POA: Insufficient documentation

## 2024-01-18 DIAGNOSIS — F4310 Post-traumatic stress disorder, unspecified: Secondary | ICD-10-CM | POA: Insufficient documentation

## 2024-01-18 DIAGNOSIS — M0880 Other juvenile arthritis, unspecified site: Secondary | ICD-10-CM | POA: Insufficient documentation

## 2024-01-18 MED ORDER — CELECOXIB 100 MG PO CAPS: 100.0000 mg | ORAL_CAPSULE | Freq: Two times a day (BID) | ORAL | 0 refills | Status: AC

## 2024-01-20 ENCOUNTER — Other Ambulatory Visit (INDEPENDENT_AMBULATORY_CARE_PROVIDER_SITE_OTHER): Payer: Self-pay

## 2024-01-20 ENCOUNTER — Ambulatory Visit: Admitting: Orthopedic Surgery

## 2024-01-20 DIAGNOSIS — M25561 Pain in right knee: Secondary | ICD-10-CM

## 2024-01-22 ENCOUNTER — Encounter: Payer: Self-pay | Admitting: Orthopedic Surgery

## 2024-01-22 NOTE — Progress Notes (Signed)
 Post-Op Visit Note   Patient: Erica Fox           Date of Birth: 12/25/1985           MRN: [id number] Visit Date: 01/20/2024 PCP: Lendia Boby CROME, NP-C   Assessment & Plan:  Chief Complaint:  Chief Complaint  Patient presents with   Right Knee - Follow-up   Visit Diagnoses:  1. Right knee pain, unspecified chronicity     Plan: Erica Fox is now about 6 weeks out right knee arthroscopy for medial meniscal tear.  She is having a flare of her rheumatoid arthritis.  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  Stairs are getting better for her.  She is doing stationary bike at home as well as stretching.  She has had cortisone and gel injections in both knees in the past which have not helped.  Radiographs show no significant change.  I did review her arthroscopy pictures particularly in regard to the patellofemoral compartment and there is some chondromalacia in that region.  On exam she does have a little bit more patellofemoral crepitus than she had at her prior clinic visit.  Not too much effusion in the right knee.  No joint line tenderness.  Plan at this time is continue with quad strengthening exercises.  6-week return for follow-up on the right knee arthroscopy and possible injection at that time.  She is doing well with her left knee replacement.  Follow-Up Instructions: No follow-ups on file.   Orders:  Orders Placed This Encounter  Procedures   XR KNEE 3 VIEW RIGHT   No orders of the defined types were placed in this encounter.   Imaging: No results found.  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   left eye, dx by opthalmology   Degenerative joint disease    OCD (obsessive compulsive disorder) 2025   per patient   PTSD (post-traumatic stress disorder) 2025   per patient   Rheumatoid arthritis (HCC)     Family History  Problem Relation Age of Onset   Heart disease Mother    Breast cancer Paternal Aunt    Healthy Son    Healthy Son     Past Surgical History:  Procedure Laterality Date   CESAREAN SECTION     2007, 2012   EYE SURGERY Right    age 36, tighten muscle for lazy eye   FOOT SURGERY Left    hammertoe correction & screws placed (2 surgeries on left foot)   FOOT SURGERY Right 05/07/2022   hammertoe correction   KNEE ARTHROSCOPY Right 12/12/2023   TOTAL KNEE ARTHROPLASTY Left 07/28/2023   Procedure: ARTHROPLASTY, KNEE, TOTAL;  Surgeon: Addie Cordella Hamilton, MD;  Location: [facility] OR;  Service: Orthopedics;  Laterality: Left;   TUBAL LIGATION     WISDOM TOOTH EXTRACTION  2014   Social History   Occupational History   Not on file  Tobacco Use   Smoking status: Never    Passive exposure: Past   Smokeless tobacco: Never  Vaping Use   Vaping status: Never Used  Substance and Sexual Activity   Alcohol use: Yes    Comment: maybe a drink once every 2 months   Drug use: Not Currently   Sexual activity: Yes    Partners: Male    Birth control/protection: I.U.D., Surgical    Comment: tubal, Mirena , menarche 38yo, sexual debut 38yo

## 2024-01-23 ENCOUNTER — Encounter: Payer: Self-pay | Admitting: Radiology

## 2024-01-23 ENCOUNTER — Ambulatory Visit: Admitting: Internal Medicine

## 2024-01-23 ENCOUNTER — Encounter: Payer: Self-pay | Admitting: Internal Medicine

## 2024-01-23 VITALS — BP 118/64 | HR 69 | Temp 97.8°F | Ht 68.0 in | Wt 261.8 lb

## 2024-01-23 DIAGNOSIS — R053 Chronic cough: Secondary | ICD-10-CM | POA: Diagnosis not present

## 2024-01-23 DIAGNOSIS — R0609 Other forms of dyspnea: Secondary | ICD-10-CM | POA: Diagnosis not present

## 2024-01-23 DIAGNOSIS — Z8739 Personal history of other diseases of the musculoskeletal system and connective tissue: Secondary | ICD-10-CM

## 2024-01-23 DIAGNOSIS — Z77120 Contact with and (suspected) exposure to mold (toxic): Secondary | ICD-10-CM

## 2024-01-23 DIAGNOSIS — R0789 Other chest pain: Secondary | ICD-10-CM

## 2024-01-23 DIAGNOSIS — M069 Rheumatoid arthritis, unspecified: Secondary | ICD-10-CM

## 2024-01-23 DIAGNOSIS — J45909 Unspecified asthma, uncomplicated: Secondary | ICD-10-CM

## 2024-01-23 LAB — NITRIC OXIDE: Nitric Oxide: 40

## 2024-01-23 MED ORDER — FLUTICASONE FUROATE-VILANTEROL 100-25 MCG/ACT IN AEPB: 1.0000 | INHALATION_SPRAY | Freq: Every day | RESPIRATORY_TRACT | Status: DC

## 2024-01-23 MED ORDER — ALBUTEROL SULFATE HFA 108 (90 BASE) MCG/ACT IN AERS: 2.0000 | INHALATION_SPRAY | Freq: Four times a day (QID) | RESPIRATORY_TRACT | 2 refills | Status: AC | PRN

## 2024-01-23 MED ORDER — FLUTICASONE FUROATE-VILANTEROL 100-25 MCG/ACT IN AEPB: 1.0000 | INHALATION_SPRAY | Freq: Every day | RESPIRATORY_TRACT | Status: AC

## 2024-01-23 NOTE — Progress Notes (Signed)
 OV 01/23/2024  Subjective:  Patient ID: Erica Fox, female , DOB: 05/28/1985 , age 38 y.o. , MRN: [id number] , ADDRESS: [address] [place] [address] PCP Lendia Boby CROME, NP-C Patient Care Team: Lendia Boby CROME, NP-C as PCP - General (Family Medicine)  This Provider for this visit: Treatment Team:  Attending Provider: Cheryl Waddell HERO, PA-C    01/23/2024 -   Chief Complaint  Patient presents with   Consult    Pt states she has SOB with exertion also when walking or doing nothing, Pt states she tries not to exert her self When having a hard time breathing patient will have a Dry cough occur     HPI Erica Fox 38 y.o. -Erica Fox is a 38 year old female with rheumatoid arthritis who presents with exertional chest tightness and cough. She was referred by her rheumatologist Dr Beatrice to evaluate potential rheumatoid arthritis-related lung disease.  She experiences chest tightness and difficulty breathing during exertion, such as walking or climbing stairs. The sensation is described as her breath being 'stuck' and has been progressively worsening over the past couple of years. She avoids activities that exacerbate these symptoms.  There is an associated cough with the chest tightness during exertion, but she does not cough at other times. No shortness of breath, wheezing, or gasping for air at night. She occasionally wakes up coughing at night, about twice a week, which started in the last three to six months.  She denies any history of acid reflux, sleep apnea, or exposure to mold or mildew at home, although she works in a building with significant mold exposure. She spends approximately eight and a half hours, two and a half days a week, in this environment. Denies birds at home. Denies using down.  Her past medical history includes rheumatoid arthritis diagnosed at age [age]. She is currently on Rinvoq  and Celebrex , having previously been on medications such as  Humira, Enbrel, and Imuran. She has seasonal allergies but was told she had no allergies after a skin test a year ago, despite being given allergy  medication.  She does not smoke and has no known family history due to being adopted, although she is aware of heart issues in her biological family.   CXR Dec 2022: CLEAR  Labs Oct 202%: Normal Creat, LFT and CBC EOS HIGH   Latest Reference Range & Units 03/20/21 20:43 03/11/22 15:39 07/22/22 09:53 09/07/22 15:44 10/06/22 15:56 11/19/22 08:51 01/04/23 13:25 03/03/23 08:45 06/01/23 16:11 10/12/23 14:11 01/06/24 08:41  Eosinophils Absolute 0.0 - 0.7 K/uL 0.1 0.3 128 44 161 242 246 108 158 152 0.3   FENO 01/23/2024 - 40ppb  Lab Results  Component Value Date   NITRICOXIDE 40 01/23/2024   This result suggests intermediate (25-49) Type 2 (T2) airway inflammation; clinical correlation required.    LAB RESULTS last 96 hours XR KNEE 3 VIEW RIGHT Result Date: 01/22/2024 AP lateral merchant radiographs right knee reviewed.  Mild spurring in the patellofemoral compartment is noted.  No acute fracture.  Alignment intact.  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   reports that she has never smoked. She has been exposed to tobacco smoke. She has never used smokeless tobacco.  Past Surgical  History:  Procedure Laterality Date   CESAREAN SECTION     2007, 2012   EYE SURGERY Right    age 32, tighten muscle for lazy eye   FOOT SURGERY Left    hammertoe correction & screws placed (2 surgeries on left foot)   FOOT SURGERY Right 05/07/2022   hammertoe correction   KNEE ARTHROSCOPY Right 12/12/2023   TOTAL KNEE ARTHROPLASTY Left  07/28/2023   Procedure: ARTHROPLASTY, KNEE, TOTAL;  Surgeon: Addie Cordella Hamilton, MD;  Location: [facility] OR;  Service: Orthopedics;  Laterality: Left;   TUBAL LIGATION     WISDOM TOOTH EXTRACTION  2014    Allergies  Allergen Reactions   Dilaudid  [Hydromorphone  Hcl] Itching and Rash   Penicillins Other (See Comments)    Adopted mother informed her of allergy  but unaware of reaction.     There is no immunization history on file for this patient.  Family History  Problem Relation Age of Onset   Heart disease Mother    Breast cancer Paternal Aunt    Healthy Son    Healthy Son      Current Outpatient Medications:    albuterol  (VENTOLIN  HFA) 108 (90 Base) MCG/ACT inhaler, Inhale 2 puffs into the lungs every 6 (six) hours as needed for wheezing or shortness of breath., Disp: 8 g, Rfl: 2   B Complex Vitamins (VITAMIN B COMPLEX PO), Take 1 capsule by mouth in the morning., Disp: , Rfl:    celecoxib  (CELEBREX ) 100 MG capsule, Take 1 capsule (100 mg total) by mouth 2 (two) times daily., Disp: 60 capsule, Rfl: 0   Cholecalciferol (VITAMIN D -3 PO), Take 1,000 Units by mouth in the morning., Disp: , Rfl:    traMADol  (ULTRAM ) 50 MG tablet, Take by mouth every 6 (six) hours as needed., Disp: , Rfl:    DULoxetine  (CYMBALTA ) 60 MG capsule, Take 1 capsule (60 mg total) by mouth daily. (Patient not taking: Reported on 01/23/2024), Disp: 30 capsule, Rfl: 1   folic acid  (FOLVITE ) 1 MG tablet, TAKE 2 TABLETS BY MOUTH EVERY DAY (Patient not taking: Reported on 01/23/2024), Disp: 180 tablet, Rfl: 3   methocarbamol  (ROBAXIN ) 500 MG tablet, Take 1 tablet (500 mg total) by mouth every 8 (eight) hours as needed for muscle spasms. (Patient not taking: Reported on 01/23/2024), Disp: 30 tablet, Rfl: 1   Upadacitinib  ER (RINVOQ ) 15 MG TB24, Take 1 tablet (15 mg total) by mouth daily. (Patient not taking: Reported on 01/23/2024), Disp: 90 tablet, Rfl: 0  Current Facility-Administered Medications:    fluticasone   furoate-vilanterol (BREO ELLIPTA) 100-25 MCG/ACT 1 puff, 1 puff, Inhalation, Daily,       Objective:   Vitals:   01/23/24 0818  BP: 118/64  Pulse: 69  Temp: 97.8 F (36.6 C)  TempSrc: Oral  SpO2: 100%  Weight: 261 lb 12.8 oz (118.8 kg)  Height: 5' 8 (1.727 m)    Estimated body mass index is 39.81 kg/m as calculated from the following:   Height as of this encounter: 5' 8 (1.727 m).   Weight as of this encounter: 261 lb 12.8 oz (118.8 kg).  @WEIGHTCHANGE @  [employer]   01/23/24 0818  Weight: 261 lb 12.8 oz (118.8 kg)     Physical Exam   General: No distress. Looks well O2 at rest: no Cane present: no Sitting in wheel chair: no Frail: no Obese: no Neuro: Alert and Oriented x 3. GCS 15. Speech normal Psych: Pleasant Resp:  Barrel Chest - no.  Wheeze - no,  Crackles - no, No overt respiratory distress CVS: Normal heart sounds. Murmurs - no Ext: Stigmata of Connective Tissue Disease - no though has RA since age 58 HEENT: Normal upper airway. PEERL +. No post nasal drip        Assessment/     Assessment & Plan Chest tightness  DOE (dyspnea on exertion)  Chronic cough  History of rheumatoid arthritis  Contact with or exposure to mold    PLAN Patient Instructions     ICD-10-CM   1. Chest tightness  R07.89     2. DOE (dyspnea on exertion)  R06.09     3. Chronic cough  R05.3 Nitric oxide    4. History of rheumatoid arthritis  Z87.39     5. Contact with or exposure to mold  Z77.120      Given your diagnose of asthma with mold exposure being a risk factor.  This based on your symptom profile and blood eosinophils being high.  Although because of your rheumatoid arthritis you are at risk for pulmonary hypertension and interstitial lung disease and with mold you are at risk for hypersensitivity pneumonitis condition  Plan - Start Breo scheduled 1 puff once daily  - Take sample Trelegy which is meant for COPD patients but has Breo in it  - If  the prescription is too expensive or it is incorrect please let us  know - Start albuterol  as needed 2 puffs as needed -Check blood work for IgE - Do full pulmonary function test in the next few weeks  Follow-up - 4 weeks with nurse practitioner at any location.  - If symptoms persist then we should try to get echocardiogram and CT scan chest    FOLLOWUP    Return for  4 weeks with nurse practitioner at any location.SABRA    SIGNATURE    Dr. Dorethia Cave, M.D., F.C.C.P,  Pulmonary and Critical Care Medicine Staff Physician, [facility] Center Director - Interstitial Lung Disease  Program  Pulmonary Fibrosis [facility] at [facility] [place], [place], [address]  Pager: [phone], If no answer or between  15:00h - 7:00h: call [phone] Telephone: [phone]  9:08 AM 01/23/2024

## 2024-01-23 NOTE — Patient Instructions (Addendum)
 ICD-10-CM   1. Chest tightness  R07.89     2. DOE (dyspnea on exertion)  R06.09     3. Chronic cough  R05.3 Nitric oxide    4. History of rheumatoid arthritis  Z87.39     5. Contact with or exposure to mold  Z77.120      Given your diagnose of asthma with mold exposure being a risk factor.  This based on your symptom profile and blood eosinophils being high.  Although because of your rheumatoid arthritis you are at risk for pulmonary hypertension and interstitial lung disease and with mold you are at risk for hypersensitivity pneumonitis condition  Plan - Start Breo scheduled 1 puff once daily  - Take sample Trelegy which is meant for COPD patients but has Breo in it  - If the prescription is too expensive or it is incorrect please let us  know - Start albuterol  as needed 2 puffs as needed -Check blood work for IgE - Do full pulmonary function test in the next few weeks  Follow-up - 4 weeks with nurse practitioner at any location.  - If symptoms persist then we should try to get echocardiogram and CT scan chest

## 2024-01-24 LAB — IGE: IgE (Immunoglobulin E), Serum: <2 kU/L (ref <=114)

## 2024-01-25 ENCOUNTER — Ambulatory Visit: Admitting: Podiatry

## 2024-01-25 ENCOUNTER — Ambulatory Visit

## 2024-01-25 VITALS — Ht 68.0 in | Wt 261.0 lb

## 2024-01-25 DIAGNOSIS — M79671 Pain in right foot: Secondary | ICD-10-CM | POA: Diagnosis not present

## 2024-01-25 DIAGNOSIS — M79672 Pain in left foot: Secondary | ICD-10-CM | POA: Diagnosis not present

## 2024-01-25 DIAGNOSIS — M2041 Other hammer toe(s) (acquired), right foot: Secondary | ICD-10-CM | POA: Diagnosis not present

## 2024-01-25 DIAGNOSIS — M19072 Primary osteoarthritis, left ankle and foot: Secondary | ICD-10-CM | POA: Diagnosis not present

## 2024-01-25 DIAGNOSIS — Q6689 Other  specified congenital deformities of feet: Secondary | ICD-10-CM

## 2024-01-25 DIAGNOSIS — M19071 Primary osteoarthritis, right ankle and foot: Secondary | ICD-10-CM

## 2024-01-25 DIAGNOSIS — M2042 Other hammer toe(s) (acquired), left foot: Secondary | ICD-10-CM | POA: Diagnosis not present

## 2024-01-25 NOTE — Progress Notes (Signed)
  Subjective:  Patient ID: Erica Fox, female    DOB: 1985/09/03,  MRN: [id number]  Chief Complaint  Patient presents with   Foot Pain    Rm 8 Patient is here for bilateral foot pain. Patient states pain on the dorsal aspect of feet with some intermittent swelling. Patient states having RA that effects the joints.    38 y.o. female presents with the above complaint. History confirmed with patient.  She returns for follow-up today she is still seeing her rheumatologist for management and is now on Rinvoq  as well as Celebrex .  Has not helped much with the foot pain.  The pain is not in the toes or around her subtalar joint but in the midfoot.  No known injury.  Objective:  Physical Exam: warm, good capillary refill, no trophic changes or ulcerative lesions, normal DP and PT pulses, normal sensory exam, and well-healed surgical scars left subtalar joint fusion and multiple digital corrections, today her pain is in the dorsal midfoot over the midtarsal joint   Radiographs: Multiple views x-ray of both feet: Well-healed subtalar joint fusion on the left and digital corrections bilaterally, she has moderate osteoarthritis of the tarsometatarsal joints especially the third on the left and severe on the right with end-stage arthrosis.  Moderate subtalar arthritis on the right.  Moderate talonavicular arthritis bilateral Assessment:   1. Arthritis of midtarsal joint of left foot   2. Arthritis of midtarsal joint of right foot   3. Coalition, talocalcaneal   4. Hammertoe of left foot   5. Hammertoe of right foot      Plan:  Patient was evaluated and treated and all questions answered.  We reviewed her x-rays and her previous surgical sites have healed well and are asymptomatic for her.  Her right subtalar joint is asymptomatic despite the arthritic changes we see as well as the talonavicular joints.  Most of her pain is in the midfoot and she does have tarsometatarsal joint arthritis.  We  discussed treatment of this with corticosteroid injection shoe gear and support.  I do think she would benefit greatly from a custom molded foot orthosis to support the midfoot and arch and she will be scheduled for fitting for these with our orthotist Lolita.  Discussed corticosteroid injection as well.  Discussed risk benefits and potential location as well as the limitation of how injections can be done in the midfoot sites on a regular basis.  Following consent and prep of Betadine  the dorsal midfoot at the third tarsometatarsal joint was injected with 20 mg of Kenalog  and 0.5 cc of 0.5% Marcaine  plain.  She tolerated well and she will follow-up me as needed for these for further treatment.  Long-term may need surgical arthrodesis of the joints as well.  No follow-ups on file.

## 2024-01-27 ENCOUNTER — Encounter: Payer: Self-pay | Admitting: Family Medicine

## 2024-02-01 ENCOUNTER — Ambulatory Visit: Admitting: Podiatry

## 2024-02-20 ENCOUNTER — Encounter (HOSPITAL_BASED_OUTPATIENT_CLINIC_OR_DEPARTMENT_OTHER)

## 2024-03-02 ENCOUNTER — Ambulatory Visit: Admitting: Orthopedic Surgery

## 2024-03-05 LAB — GENECONNECT MOLECULAR SCREEN: Genetic Analysis Overall Interpretation: NEGATIVE

## 2024-03-19 ENCOUNTER — Ambulatory Visit (INDEPENDENT_AMBULATORY_CARE_PROVIDER_SITE_OTHER): Admitting: Orthopedic Surgery

## 2024-03-19 DIAGNOSIS — M25561 Pain in right knee: Secondary | ICD-10-CM

## 2024-03-20 ENCOUNTER — Encounter: Payer: Self-pay | Admitting: Orthopedic Surgery

## 2024-03-20 NOTE — Progress Notes (Signed)
" ° °  Post-Op Visit Note   Patient: Erica Fox           Date of Birth: 17-Jan-1986           MRN: [id number] Visit Date: 03/19/2024 PCP: Lendia Boby CROME, NP-C   Assessment & Plan:  Chief Complaint:  Chief Complaint  Patient presents with   Right Knee - Follow-up   Visit Diagnoses:  1. Right knee pain, unspecified chronicity     Plan: Changes the patient underwent right knee arthroscopy with posterior medial meniscectomy 12/12/2023.  Doing well with no problems.  Reports decreased pain.  No physical therapy.  Plan at this time is decision for against injection.  Overall her pain in the knee is livable.  She is walking on the treadmill and biking.  Stairs are okay.  On exam no effusion good range of motion and improving strength.  Plan at this time is to continue with nonload bearing quad strengthening exercises and follow-up with us  as needed.  Follow-Up Instructions: No follow-ups on file.   Orders:  No orders of the defined types were placed in this encounter.  No orders of the defined types were placed in this encounter.   Imaging: No results found.  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   left eye, dx by opthalmology   Degenerative joint disease    OCD (obsessive compulsive disorder) 2025   per patient   PTSD (post-traumatic stress disorder) 2025   per patient   Rheumatoid arthritis (HCC)     Family History  Problem Relation Age of Onset   Heart disease Mother    Breast cancer Paternal Aunt    Healthy Son    Healthy Son     Past Surgical History:   Procedure Laterality Date   CESAREAN SECTION     2007, 2012   EYE SURGERY Right    age 50, tighten muscle for lazy eye   FOOT SURGERY Left    hammertoe correction & screws placed (2 surgeries on left foot)   FOOT SURGERY Right 05/07/2022   hammertoe correction   KNEE ARTHROSCOPY Right 12/12/2023   TOTAL KNEE ARTHROPLASTY Left 07/28/2023   Procedure: ARTHROPLASTY, KNEE, TOTAL;  Surgeon: Addie Cordella Hamilton, MD;  Location: [facility] OR;  Service: Orthopedics;  Laterality: Left;   TUBAL LIGATION     WISDOM TOOTH EXTRACTION  2014   Social History   Occupational History   Not on file  Tobacco Use   Smoking status: Never    Passive exposure: Past   Smokeless tobacco: Never  Vaping Use   Vaping status: Never Used  Substance and Sexual Activity   Alcohol use: Yes    Comment: maybe a drink once every 2 months   Drug use: Not Currently   Sexual activity: Yes    Partners: Male    Birth control/protection: I.U.D., Surgical    Comment: tubal, Mirena , menarche 38yo, sexual debut 38yo     "

## 2024-03-30 ENCOUNTER — Ambulatory Visit: Payer: 59 | Admitting: Radiology

## 2024-04-09 NOTE — Progress Notes (Signed)
 "  Office Visit Note  Patient: Erica Fox             Date of Birth: [birth year]           MRN: [id number]             PCP: Lendia Boby CROME, NP-C Referring: Lendia Boby CROME, NP-C Visit Date: 04/20/2024 Occupation: Data Unavailable  Subjective:  Holding Rinvoq   History of Present Illness: Antonio Woodhams is a 39 y.o. female with history seronegative rheumatoid arthritis.  Patient is prescribed rinvoq  15 mg 1 tablet by mouth daily.  Patient has not yet resumed taking Rinvoq  due to having infection after the last office visit.  Patient states that as she was recovering from the previous infection she developed another viral upper respiratory tract infection.  Patient states she is no longer having a fever but continues to have some drainage.  Patient states that when she was actively sick she was taking DayQuil but is no longer taking any over-the-counter products for symptomatic relief.  She denies any signs or symptoms of a rheumatoid arthritis flare.  She has noticed an increase lower back pain and has been using a TENS unit.  Patient had a right knee arthroscopy in September 2025 under the care of Dr. Addie.  She is considering proceeding with a right knee replacement next year.  Patient states her left knee replacement is doing well.  She is been using a stationary bike at home.  She denies any joint swelling at this time.  She has had some increased discomfort in the left foot and ankle.  She is under the care of podiatry and will be fitted for orthotics.   Activities of Daily Living:  Patient reports morning stiffness for all day.   Patient Reports nocturnal pain.  Difficulty dressing/grooming: Reports Difficulty climbing stairs: Denies Difficulty getting out of chair: Reports Difficulty using hands for taps, buttons, cutlery, and/or writing: Reports  Review of Systems  Constitutional:  Positive for fatigue.  HENT:  Negative for mouth sores and mouth dryness.   Eyes:  Negative for dryness.   Respiratory:  Positive for shortness of breath.   Cardiovascular:  Positive for palpitations. Negative for chest pain.  Gastrointestinal:  Positive for constipation. Negative for blood in stool and diarrhea.  Endocrine: Negative for increased urination.  Genitourinary:  Negative for involuntary urination.  Musculoskeletal:  Positive for joint pain, gait problem, joint pain, joint swelling, morning stiffness and muscle tenderness. Negative for myalgias, muscle weakness and myalgias.  Skin:  Negative for color change, rash, hair loss and sensitivity to sunlight.  Allergic/Immunologic: Negative for susceptible to infections.  Neurological:  Positive for headaches. Negative for dizziness.  Hematological:  Negative for swollen glands.  Psychiatric/Behavioral:  Negative for depressed mood and sleep disturbance. The patient is nervous/anxious.     PMFS History:  Patient Active Problem List   Diagnosis Date Noted   Chronic pain syndrome 08/17/2023   S/P total knee replacement, left 07/28/2023   Rosacea 02/02/2023   Seasonal allergies 08/26/2022   Rheumatoid arthritis of multiple sites with negative rheumatoid factor (HCC) 07/22/2022    Past Medical History:  Diagnosis Date   ADHD (attention deficit hyperactivity disorder) 2025   per patient   Anemia    Asthma    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   left eye, dx by opthalmology   Degenerative joint disease    OCD (obsessive compulsive disorder) 2025   per patient   PTSD (post-traumatic stress disorder) 2025  per patient   Rheumatoid arthritis (HCC)     Family History  Problem Relation Age of Onset   Heart disease Mother    Breast cancer Paternal Aunt    Healthy Son    Healthy Son    Past Surgical History:  Procedure Laterality Date   CESAREAN SECTION     2007, 2012   EYE SURGERY Right    age 45, tighten muscle for lazy eye   FOOT SURGERY Left    hammertoe correction & screws placed (2 surgeries on left foot)    FOOT SURGERY Right 05/07/2022   hammertoe correction   KNEE ARTHROSCOPY Right 12/12/2023   TOTAL KNEE ARTHROPLASTY Left 07/28/2023   Procedure: ARTHROPLASTY, KNEE, TOTAL;  Surgeon: Addie Cordella Hamilton, MD;  Location: [facility] OR;  Service: Orthopedics;  Laterality: Left;   TUBAL LIGATION     WISDOM TOOTH EXTRACTION  2014   Social History[1] Social History   Social History Narrative   Not on file      There is no immunization history on file for this patient.   Objective: Vital Signs: BP 120/80   Pulse 71   Temp 98.6 F (37 C)   Resp 14   Ht 5' 8 (1.727 m)   Wt 274 lb (124.3 kg)   BMI 41.66 kg/m    Physical Exam Vitals and nursing note reviewed.  Constitutional:      Appearance: She is well-developed.  HENT:     Head: Normocephalic and atraumatic.  Eyes:     Conjunctiva/sclera: Conjunctivae normal.  Cardiovascular:     Rate and Rhythm: Normal rate and regular rhythm.     Heart sounds: Normal heart sounds.  Pulmonary:     Effort: Pulmonary effort is normal.     Breath sounds: Normal breath sounds.  Abdominal:     General: Bowel sounds are normal.     Palpations: Abdomen is soft.  Musculoskeletal:     Cervical back: Normal range of motion.  Lymphadenopathy:     Cervical: No cervical adenopathy.  Skin:    General: Skin is warm and dry.     Capillary Refill: Capillary refill takes less than 2 seconds.  Neurological:     Mental Status: She is alert and oriented to person, place, and time.  Psychiatric:        Behavior: Behavior normal.      Musculoskeletal Exam: C-spine has limited range of motion.  Shoulder joints and elbow joints have good range of motion.  Severely limited range of motion of both wrist joints.  No tenderness or synovitis of MCP joints.  Synovial thickening of all MCP and PIP joints.  Crepitus with range of motion of the right knee.  Left knee replacement has good range of motion with no effusion.  Limited range of motion of both ankle joints with  some tenderness upon palpation bilaterally.  Tenderness on the dorsal aspect of both feet.  CDAI Exam: CDAI Score: -- Patient Global: --; Provider Global: -- Swollen: --; Tender: -- Joint Exam 04/20/2024   No joint exam has been documented for this visit   There is currently no information documented on the homunculus. Go to the Rheumatology activity and complete the homunculus joint exam.  Investigation: No additional findings.  Imaging: No results found.  Recent Labs: Lab Results  Component Value Date   WBC 8.3 01/06/2024   HGB 13.0 01/06/2024   PLT 205.0 01/06/2024   NA 138 01/06/2024   K 3.8 01/06/2024   CL  108 01/06/2024   CO2 23 01/06/2024   GLUCOSE 98 01/06/2024   BUN 11 01/06/2024   CREATININE 0.66 01/06/2024   BILITOT 0.7 01/06/2024   ALKPHOS 72 01/06/2024   AST 13 01/06/2024   ALT 11 01/06/2024   PROT 8.0 01/06/2024   ALBUMIN 4.5 01/06/2024   CALCIUM 8.9 01/06/2024   QFTBGOLDPLUS NEGATIVE 10/12/2023    Speciality Comments: Enbrel-discontinued Humira-inadequate response Simponi  started October 05, 2022 Methotrexate  started August 26, 2022  Procedures:  No procedures performed Allergies: Dilaudid  [hydromorphone  hcl] and Penicillins   Assessment / Plan:     Visit Diagnoses: Rheumatoid arthritis of multiple sites with negative rheumatoid factor (HCC) - RF negative, anti-CCP negative, MCV negative, elevated sedimentation rate. JIA at age 34.  Severe erosive rheumatoid arthritis: She has no synovitis on examination today.  She has not had any signs or symptoms of a recent rheumatoid arthritis flare.  She has not been arthralgias and joint stiffness but overall her symptoms have been manageable.  She has not been taking Celebrex  or any over-the-counter products for symptomatic relief recently.  Patient has been holding Rinvoq  while recovering from a viral upper respiratory tract infection.  She has not had any fevers and overall her symptoms have improved  significantly.  She continues to have some congestion and was encouraged to use an antihistamine and/or decongestant.  Once her symptoms have completely resolved she will resume taking Rinvoq  15 mg 1 tablet daily.  She will notify us  if she develops any signs or symptoms of a flare. She will follow-up in the office in 3 months or sooner if needed.  High risk medication use - Currently holding--Rinvoq  15 mg 1 tablet by mouth daily-She plans to resume once the infection has completely cleared.  CBC and CMP updated on 01/06/24. Orders for CBC and CMP released today.  TB gold negative on 10/12/23.  Discussed the importance of holding rinvoq  if she develops signs or symptoms of an infection and to resume once the infection has completely cleared.   - Plan: CBC with Differential/Platelet, Comprehensive metabolic panel with GFR  Pain in both hands: Severely limited range of motion of both wrists. No active synovitis at this time.   Primary osteoarthritis of right knee - Underwent arthroscopic surgery on 12/12/2023.  Under the care of Dr. Addie.  Patient will likely require a total knee replacement next year per patient.  S/P total knee replacement, left - Performed by Dr. Addie on 07/28/2023.  Doing well.  Good range of motion with no effusion.  She has had less difficulty climbing steps.  She is been using a stationary bike for exercise.  Pain in both feet - Left achilles tendonitis-under the care of Dr. Silva.  Patient will be fitted for orthotics.  She is hoping to avoid surgical intervention.  Breathlessness on mild exertion - Evaluated by Dr. Eilleen for asthma with mold exposure patient started on Breo 1 puff daily and albuterol  2 puffs as needed.   Other medical conditions are listed as follows:   Elevated sed rate  IgA deficiency (HCC)  History of ADHD  JIA (juvenile idiopathic arthritis) (HCC) - Diagnosed at age 55.  Treated with different DMARDs and Biologics until age 19.  She  stopped methotrexate  and Enbrel at age 60  Seasonal allergies  Vitamin D  deficiency  History of depression  Other fatigue: She has been experiencing increased fatigue.  She has been sleeping well at night taking melatonin at bedtime.  She has started using the stationary  bike for exercise.  PTSD (post-traumatic stress disorder)  Orders: Orders Placed This Encounter  Procedures   CBC with Differential/Platelet   Comprehensive metabolic panel with GFR   No orders of the defined types were placed in this encounter.     Follow-Up Instructions: Return in about 3 months (around 07/19/2024) for Rheumatoid arthritis.   Waddell CHRISTELLA Craze, PA-C  Note - This record has been created using Dragon software.  Chart creation errors have been sought, but may not always  have been located. Such creation errors do not reflect on  the standard of medical care.     [1]  Social History Tobacco Use   Smoking status: Never    Passive exposure: Past   Smokeless tobacco: Never  Vaping Use   Vaping status: Never Used  Substance Use Topics   Alcohol use: Yes    Comment: maybe a drink once every 2 months   Drug use: Not Currently   "

## 2024-04-11 ENCOUNTER — Encounter: Payer: Self-pay | Admitting: Family Medicine

## 2024-04-13 ENCOUNTER — Other Ambulatory Visit

## 2024-04-20 ENCOUNTER — Ambulatory Visit: Attending: Orthopedic Surgery | Admitting: Physician Assistant

## 2024-04-20 ENCOUNTER — Encounter: Payer: Self-pay | Admitting: Physician Assistant

## 2024-04-20 VITALS — BP 120/80 | HR 71 | Temp 98.6°F | Resp 14 | Ht 68.0 in | Wt 274.0 lb

## 2024-04-20 DIAGNOSIS — M79641 Pain in right hand: Secondary | ICD-10-CM | POA: Diagnosis not present

## 2024-04-20 DIAGNOSIS — M1711 Unilateral primary osteoarthritis, right knee: Secondary | ICD-10-CM | POA: Diagnosis not present

## 2024-04-20 DIAGNOSIS — R700 Elevated erythrocyte sedimentation rate: Secondary | ICD-10-CM | POA: Diagnosis not present

## 2024-04-20 DIAGNOSIS — Z96652 Presence of left artificial knee joint: Secondary | ICD-10-CM | POA: Insufficient documentation

## 2024-04-20 DIAGNOSIS — R5383 Other fatigue: Secondary | ICD-10-CM | POA: Insufficient documentation

## 2024-04-20 DIAGNOSIS — R0681 Apnea, not elsewhere classified: Secondary | ICD-10-CM | POA: Diagnosis not present

## 2024-04-20 DIAGNOSIS — M79642 Pain in left hand: Secondary | ICD-10-CM | POA: Insufficient documentation

## 2024-04-20 DIAGNOSIS — F4310 Post-traumatic stress disorder, unspecified: Secondary | ICD-10-CM | POA: Insufficient documentation

## 2024-04-20 DIAGNOSIS — M0609 Rheumatoid arthritis without rheumatoid factor, multiple sites: Secondary | ICD-10-CM | POA: Diagnosis not present

## 2024-04-20 DIAGNOSIS — M0880 Other juvenile arthritis, unspecified site: Secondary | ICD-10-CM | POA: Insufficient documentation

## 2024-04-20 DIAGNOSIS — D802 Selective deficiency of immunoglobulin A [IgA]: Secondary | ICD-10-CM | POA: Diagnosis not present

## 2024-04-20 DIAGNOSIS — M79672 Pain in left foot: Secondary | ICD-10-CM | POA: Insufficient documentation

## 2024-04-20 DIAGNOSIS — M79671 Pain in right foot: Secondary | ICD-10-CM | POA: Insufficient documentation

## 2024-04-20 DIAGNOSIS — Z79899 Other long term (current) drug therapy: Secondary | ICD-10-CM | POA: Diagnosis not present

## 2024-04-20 DIAGNOSIS — J302 Other seasonal allergic rhinitis: Secondary | ICD-10-CM | POA: Diagnosis not present

## 2024-04-20 DIAGNOSIS — E559 Vitamin D deficiency, unspecified: Secondary | ICD-10-CM | POA: Insufficient documentation

## 2024-04-20 DIAGNOSIS — Z8659 Personal history of other mental and behavioral disorders: Secondary | ICD-10-CM | POA: Insufficient documentation

## 2024-04-20 LAB — COMPREHENSIVE METABOLIC PANEL WITH GFR
AG Ratio: 1.3 (calc) (ref 1.0–2.5)
ALT: 10 U/L (ref 6–29)
AST: 12 U/L (ref 10–30)
Albumin: 4.2 g/dL (ref 3.6–5.1)
Alkaline phosphatase (APISO): 77 U/L (ref 31–125)
BUN: 10 mg/dL (ref 7–25)
CO2: 23 mmol/L (ref 20–32)
Calcium: 9.1 mg/dL (ref 8.6–10.2)
Chloride: 107 mmol/L (ref 98–110)
Creat: 0.56 mg/dL (ref 0.50–0.97)
Globulin: 3.3 g/dL (ref 1.9–3.7)
Glucose, Bld: 96 mg/dL (ref 65–99)
Potassium: 3.7 mmol/L (ref 3.5–5.3)
Sodium: 137 mmol/L (ref 135–146)
Total Bilirubin: 0.4 mg/dL (ref 0.2–1.2)
Total Protein: 7.5 g/dL (ref 6.1–8.1)
eGFR: 119 mL/min/{1.73_m2}

## 2024-04-20 LAB — CBC WITH DIFFERENTIAL/PLATELET
Absolute Lymphocytes: 2142 {cells}/uL (ref 850–3900)
Absolute Monocytes: 503 {cells}/uL (ref 200–950)
Basophils Absolute: 61 {cells}/uL (ref 0–200)
Basophils Relative: 0.9 %
Eosinophils Absolute: 320 {cells}/uL (ref 15–500)
Eosinophils Relative: 4.7 %
HCT: 41.2 % (ref 35.9–46.0)
Hemoglobin: 13.2 g/dL (ref 11.7–15.5)
MCH: 28.4 pg (ref 27.0–33.0)
MCHC: 32 g/dL (ref 31.6–35.4)
MCV: 88.6 fL (ref 81.4–101.7)
MPV: 11.1 fL (ref 7.5–12.5)
Monocytes Relative: 7.4 %
Neutro Abs: 3774 {cells}/uL (ref 1500–7800)
Neutrophils Relative %: 55.5 %
Platelets: 150 10*3/uL (ref 140–400)
RBC: 4.65 Million/uL (ref 3.80–5.10)
RDW: 12.9 % (ref 11.0–15.0)
Total Lymphocyte: 31.5 %
WBC: 6.8 10*3/uL (ref 3.8–10.8)

## 2024-04-24 ENCOUNTER — Ambulatory Visit: Payer: Self-pay | Admitting: Physician Assistant

## 2024-04-24 NOTE — Progress Notes (Signed)
 CBC and CMP WNL

## 2024-04-27 ENCOUNTER — Ambulatory Visit: Admitting: Family Medicine

## 2024-05-01 ENCOUNTER — Ambulatory Visit: Admitting: Family Medicine

## 2024-07-19 ENCOUNTER — Ambulatory Visit: Attending: Orthopedic Surgery | Admitting: Physician Assistant
